# Patient Record
Sex: Male | Born: 1945 | Race: White | Hispanic: No | Marital: Married | Smoking: Former smoker
Health system: Southern US, Community
[De-identification: ages and names within clinical notes are randomized; demographics above are authoritative.]

## PROBLEM LIST (undated history)

## (undated) DIAGNOSIS — J45909 Unspecified asthma, uncomplicated: Secondary | ICD-10-CM

## (undated) DIAGNOSIS — K5792 Diverticulitis of intestine, part unspecified, without perforation or abscess without bleeding: Secondary | ICD-10-CM

## (undated) DIAGNOSIS — R011 Cardiac murmur, unspecified: Secondary | ICD-10-CM

## (undated) DIAGNOSIS — N189 Chronic kidney disease, unspecified: Secondary | ICD-10-CM

## (undated) DIAGNOSIS — K222 Esophageal obstruction: Secondary | ICD-10-CM

## (undated) DIAGNOSIS — I10 Essential (primary) hypertension: Secondary | ICD-10-CM

## (undated) DIAGNOSIS — E785 Hyperlipidemia, unspecified: Secondary | ICD-10-CM

## (undated) DIAGNOSIS — K219 Gastro-esophageal reflux disease without esophagitis: Secondary | ICD-10-CM

## (undated) DIAGNOSIS — I2510 Atherosclerotic heart disease of native coronary artery without angina pectoris: Secondary | ICD-10-CM

## (undated) DIAGNOSIS — R1310 Dysphagia, unspecified: Secondary | ICD-10-CM

## (undated) DIAGNOSIS — Z87442 Personal history of urinary calculi: Secondary | ICD-10-CM

## (undated) DIAGNOSIS — M1990 Unspecified osteoarthritis, unspecified site: Secondary | ICD-10-CM

## (undated) DIAGNOSIS — N200 Calculus of kidney: Secondary | ICD-10-CM

## (undated) DIAGNOSIS — J302 Other seasonal allergic rhinitis: Secondary | ICD-10-CM

## (undated) DIAGNOSIS — Z5189 Encounter for other specified aftercare: Secondary | ICD-10-CM

## (undated) DIAGNOSIS — K5790 Diverticulosis of intestine, part unspecified, without perforation or abscess without bleeding: Secondary | ICD-10-CM

## (undated) DIAGNOSIS — R7303 Prediabetes: Secondary | ICD-10-CM

## (undated) DIAGNOSIS — L309 Dermatitis, unspecified: Secondary | ICD-10-CM

## (undated) DIAGNOSIS — IMO0002 Reserved for concepts with insufficient information to code with codable children: Secondary | ICD-10-CM

## (undated) HISTORY — DX: Reserved for concepts with insufficient information to code with codable children: IMO0002

## (undated) HISTORY — PX: HIATAL HERNIA REPAIR: SHX195

## (undated) HISTORY — DX: Dysphagia, unspecified: R13.10

## (undated) HISTORY — DX: Diverticulosis of intestine, part unspecified, without perforation or abscess without bleeding: K57.90

## (undated) HISTORY — DX: Dermatitis, unspecified: L30.9

## (undated) HISTORY — DX: Other seasonal allergic rhinitis: J30.2

## (undated) HISTORY — DX: Chronic kidney disease, unspecified: N18.9

## (undated) HISTORY — DX: Encounter for other specified aftercare: Z51.89

## (undated) HISTORY — DX: Essential (primary) hypertension: I10

## (undated) HISTORY — DX: Esophageal obstruction: K22.2

## (undated) HISTORY — DX: Hyperlipidemia, unspecified: E78.5

## (undated) HISTORY — DX: Diverticulitis of intestine, part unspecified, without perforation or abscess without bleeding: K57.92

## (undated) HISTORY — DX: Unspecified asthma, uncomplicated: J45.909

## (undated) HISTORY — DX: Calculus of kidney: N20.0

## (undated) HISTORY — PX: WISDOM TOOTH EXTRACTION: SHX21

## (undated) HISTORY — PX: LAPAROSCOPIC NISSEN FUNDOPLICATION: SHX1932

## (undated) HISTORY — DX: Gastro-esophageal reflux disease without esophagitis: K21.9

---

## 2000-01-18 ENCOUNTER — Ambulatory Visit (HOSPITAL_COMMUNITY): Admission: RE | Admit: 2000-01-18 | Discharge: 2000-01-18 | Payer: Self-pay | Admitting: Internal Medicine

## 2002-01-28 ENCOUNTER — Emergency Department (HOSPITAL_COMMUNITY): Admission: EM | Admit: 2002-01-28 | Discharge: 2002-01-28 | Payer: Self-pay | Admitting: Emergency Medicine

## 2006-10-03 ENCOUNTER — Encounter: Admission: RE | Admit: 2006-10-03 | Discharge: 2006-10-03 | Payer: Self-pay | Admitting: Family Medicine

## 2010-11-25 DIAGNOSIS — IMO0001 Reserved for inherently not codable concepts without codable children: Secondary | ICD-10-CM

## 2010-11-25 DIAGNOSIS — Z5189 Encounter for other specified aftercare: Secondary | ICD-10-CM

## 2010-11-25 HISTORY — PX: COLONOSCOPY: SHX174

## 2010-11-25 HISTORY — DX: Reserved for inherently not codable concepts without codable children: IMO0001

## 2010-11-25 HISTORY — DX: Encounter for other specified aftercare: Z51.89

## 2010-11-28 ENCOUNTER — Encounter (INDEPENDENT_AMBULATORY_CARE_PROVIDER_SITE_OTHER): Payer: Self-pay | Admitting: *Deleted

## 2010-12-27 NOTE — Letter (Signed)
Summary: Pre Visit Letter Revised  [facility]  [address] [place], [place] [address]   Phone: [phone]  Fax: [phone]        11/28/2010 MRN: [id number] [name] [name] [address] [place], [place]  [address]             Procedure Date:  January 22, 2011   Dir Col-Dr Juanda Chance   Welcome to the Gastroenterology Division at [facility].    You are scheduled to see a nurse for your pre-procedure visit on January 08, 2011 at 8:00am on the 3rd floor at [facility], [address]  We ask that you try to arrive at our office 15 minutes prior to your appointment time to allow for check-in.  Please take a minute to review the attached form.  If you answer "Yes" to one or more of the questions on the first page, we ask that you call the person listed at your earliest opportunity.  If you answer "No" to all of the questions, please complete the rest of the form and bring it to your appointment.    Your nurse visit will consist of discussing your medical and surgical history, your immediate family medical history, and your medications.   If you are unable to list all of your medications on the form, please bring the medication bottles to your appointment and we will list them.  We will need to be aware of both prescribed and over the counter drugs.  We will need to know exact dosage information as well.    Please be prepared to read and sign documents such as consent forms, a financial agreement, and acknowledgement forms.  If necessary, and with your consent, a friend or relative is welcome to sit-in on the nurse visit with you.  Please bring your insurance card so that we may make a copy of it.  If your insurance requires a referral to see a specialist, please bring your referral form from your primary care physician.  No co-pay is required for this nurse visit.     If you cannot keep your appointment, please call [phone] to cancel or reschedule prior to  your appointment date.  This allows [place] the opportunity to schedule an appointment for another patient in need of care.    Thank you for choosing [facility] for your medical needs.  We appreciate the opportunity to care for you.  Please visit [place] at our website  to learn more about our practice.  Sincerely, The Gastroenterology Division

## 2011-01-04 ENCOUNTER — Encounter (INDEPENDENT_AMBULATORY_CARE_PROVIDER_SITE_OTHER): Payer: Self-pay

## 2011-01-08 ENCOUNTER — Encounter: Payer: Self-pay | Admitting: Internal Medicine

## 2011-01-16 NOTE — Letter (Signed)
Summary: [facility] - Closed Instructions  [facility]  [address] [place], [place] [address]   Phone: [phone]  Fax: [phone]       [name] [name]    Jul 16, 1946    MRN: [id number]       Procedure Day /Date:  Tuesday 01/22/2011     Arrival Time: 7:30 am     Procedure Time: 8:30 am     Location of Procedure:                    _x _  [facility] (4th Floor)    PREPARATION FOR COLONOSCOPY WITH MIRALAX  Starting 5 days prior to your procedure Thursday 2/23 do not eat nuts, seeds, popcorn, corn, beans, peas,  salads, or any raw vegetables.  Do not take any fiber supplements (e.g. Metamucil, Citrucel, and Benefiber). ____________________________________________________________________________________________________   THE DAY BEFORE YOUR PROCEDURE         DATE: Monday 2/27  1   Drink clear liquids the entire day-NO SOLID FOOD  2   Do not drink anything colored red or purple.  Avoid juices with pulp.  No orange juice.  3   Drink at least 64 oz. (8 glasses) of fluid/clear liquids during the day to prevent dehydration and help the prep work efficiently.  CLEAR LIQUIDS INCLUDE: Water Jello Ice Popsicles Tea (sugar ok, no milk/cream) Powdered fruit flavored drinks Coffee (sugar ok, no milk/cream) Gatorade Juice: apple, white grape, white cranberry  Lemonade Clear bullion, consomm, broth Carbonated beverages (any kind) Strained chicken noodle soup Hard Candy  4   Mix the entire bottle of Miralax with 64 oz. of Gatorade/Powerade in the morning and put in the refrigerator to chill.  5   At 3:00 pm take 2 Dulcolax/Bisacodyl tablets.  6   At 4:30 pm take one Reglan/Metoclopramide tablet.  7  Starting at 5:00 pm drink one 8 oz glass of the Miralax mixture every 15-20 minutes until you have finished drinking the entire 64 oz.  You should finish drinking prep around 7:30 or 8:00 pm.  8   If you are nauseated, you may take the 2nd Reglan/Metoclopramide tablet  at 6:30 pm.        9    At 8:00 pm take 2 more DULCOLAX/Bisacodyl tablets.     THE DAY OF YOUR PROCEDURE      DATE:  Tuesday 2/28  You may drink clear liquids until 6:30 am   (2 HOURS BEFORE PROCEDURE).   MEDICATION INSTRUCTIONS  Unless otherwise instructed, you should take regular prescription medications with a small sip of water as early as possible the morning of your procedure.         OTHER INSTRUCTIONS  You will need a responsible adult at least 63 years of age to accompany you and drive you home.   This person must remain in the waiting room during your procedure.  Wear loose fitting clothing that is easily removed.  Leave jewelry and other valuables at home.  However, you may wish to bring a book to read or an iPod/MP3 player to listen to music as you wait for your procedure to start.  Remove all body piercing jewelry and leave at home.  Total time from sign-in until discharge is approximately 2-3 hours.  You should go home directly after your procedure and rest.  You can resume normal activities the day after your procedure.  The day of your procedure you should not:   Drive  Make legal decisions   Operate machinery   Drink alcohol   Return to work  You will receive specific instructions about eating, activities and medications before you leave.   The above instructions have been reviewed and explained to me by   Ulis Rias RN  January 08, 2011 8:19 AM     I fully understand and can verbalize these instructions _____________________________ Date _______

## 2011-01-16 NOTE — Miscellaneous (Signed)
Summary: Lec previsit  Clinical Lists Changes  Medications: Added new medication of MIRALAX   POWD (POLYETHYLENE GLYCOL 3350) As per prep  instructions. - Signed Added new medication of DULCOLAX 5 MG  TBEC (BISACODYL) Day before procedure take 2 at 3pm and 2 at 8pm. - Signed Added new medication of REGLAN 10 MG  TABS (METOCLOPRAMIDE HCL) As per prep instructions. - Signed Rx of MIRALAX   POWD (POLYETHYLENE GLYCOL 3350) As per prep  instructions.;  #255gm x 0;  Signed;  Entered by: Ulis Rias RN;  Authorized by: Hart Carwin MD;  Method used: Electronically to [id number] [address] [place], [place], [place]  [address], Ph: [phone], Fax: [phone] Rx of DULCOLAX 5 MG  TBEC (BISACODYL) Day before procedure take 2 at 3pm and 2 at 8pm.;  #4 x 0;  Signed;  Entered by: Ulis Rias RN;  Authorized by: Hart Carwin MD;  Method used: Electronically to [id number] [address] [place], [place], [place]  [address], Ph: [phone], Fax: [phone] Rx of REGLAN 10 MG  TABS (METOCLOPRAMIDE HCL) As per prep instructions.;  #2 x 0;  Signed;  Entered by: Ulis Rias RN;  Authorized by: Hart Carwin MD;  Method used: [facility] [phone]*, [address], [place], [place], [place]  [address], Ph: [phone], Fax: [phone] Allergies: Added new allergy or adverse reaction of * SULFUR Observations: Added new observation of NKA: F (01/08/2011 8:04)    Prescriptions: REGLAN 10 MG  TABS (METOCLOPRAMIDE HCL) As per prep instructions.  #2 x 0   Entered by:   Ulis Rias RN   Authorized by:   Hart Carwin MD   Signed by:   Ulis Rias RN on 01/08/2011   Method used:   [facility]ronically to        [id number] ([facility])       [address]       [place], [place]  [address]       Ph: [phone]       Fax: [phone]   RxID:   [id number] DULCOLAX 5 MG  TBEC (BISACODYL) Day  before procedure take 2 at 3pm and 2 at 8pm.  #4 x 0   Entered by:   Ulis Rias RN   Authorized by:   Hart Carwin MD   Signed by:   Ulis Rias RN on 01/08/2011   Method used:   [facility]ron[facility]        [facility] [address] [phone][facility] (retail)       [address]       [place], [place]  [address]       Ph: [phone]       Fax: [phone]   RxID:   [id number] MIRALAX   POWD (POLYETHYLENE GLYCOL 3350) As per prep  instructions.  #255gm x 0   Entered by:   Ulis Rias RN   Authorized by:   Hart Carwin MD   Signed by:   Ulis Rias RN on 01/08/2011   Method used:   [facility]        [facility] [id number]       [address] [facility]  [place], [place]  [address]       Ph: [phone]       Fax: [phone]   RxID:   [id number]

## 2011-01-22 ENCOUNTER — Other Ambulatory Visit (AMBULATORY_SURGERY_CENTER): Payer: Managed Care, Other (non HMO) | Admitting: Internal Medicine

## 2011-01-22 ENCOUNTER — Other Ambulatory Visit: Payer: Self-pay | Admitting: Internal Medicine

## 2011-01-22 ENCOUNTER — Encounter: Payer: Self-pay | Admitting: Internal Medicine

## 2011-01-22 DIAGNOSIS — K5730 Diverticulosis of large intestine without perforation or abscess without bleeding: Secondary | ICD-10-CM

## 2011-01-22 DIAGNOSIS — Z1211 Encounter for screening for malignant neoplasm of colon: Secondary | ICD-10-CM

## 2011-01-31 NOTE — Procedures (Signed)
Summary: Colonoscopy  Patient: Seanpaul Preece Note: All result statuses are Final unless otherwise noted.  Tests: (1) Colonoscopy (COL)   COL Colonoscopy           DONE     [facility]     [address]     [place], [place]  [address]           COLONOSCOPY PROCEDURE REPORT           PATIENT:  Inocencio, Roy  MR#:  [id number]     BIRTHDATE:  30-Oct-1946, 64 yrs. old  GENDER:  male     ENDOSCOPIST:  Hedwig Morton. Juanda Chance, MD     REF. BY:  Thayer Headings, M.D.     PROCEDURE DATE:  01/22/2011     PROCEDURE:  Colonoscopy 19[address]     ASA CLASS:  Class I     INDICATIONS:  colorectal cancer screening, average risk     MEDICATIONS:   Versed 6 mg, Fentanyl 50 mcg           DESCRIPTION OF PROCEDURE:   After the risks benefits and     alternatives of the procedure were thoroughly explained, informed     consent was obtained.  Digital rectal exam was performed and     revealed no rectal masses.   The LB [id number] endoscope     was introduced through the anus and advanced to the cecum, which     was identified by both the appendix and ileocecal valve, without     limitations.  The quality of the prep was good, using MiraLax.     The instrument was then slowly withdrawn as the colon was fully     examined.     <<PROCEDUREIMAGES>>           FINDINGS:  Mild diverticulosis was found in the sigmoid colon (see     image1 and image6).  This was otherwise a normal examination of     the colon (see image7, image5, image4, image3, and image2).     Retroflexed views in the rectum revealed no abnormalities.    The     scope was then withdrawn from the patient and the procedure     completed.           COMPLICATIONS:  None     ENDOSCOPIC IMPRESSION:     1) Mild diverticulosis in the sigmoid colon     2) Otherwise normal examination     RECOMMENDATIONS:     1) high fiber diet     REPEAT EXAM:  In 10 year(s) for.           ______________________________     Hedwig Morton. Juanda Chance, MD            CC:           n.     eSIGNED:   Hedwig Morton. Octavius Shin at 01/22/2011 09:04 AM           Jacolyn Reedy, [phone]  Note: An exclamation mark (!) indicates a result that was not dispersed into the flowsheet. Document Creation Date: 01/22/2011 9:04 AM _______________________________________________________________________  (1) Order result status: Final Collection or observation date-time: 01/22/2011 08:58 Requested date-time:  Receipt date-time:  Reported date-time:  Referring Physician:   Ordering Physician: Lina Sar [phone]) Specimen Source:  Source: Launa Grill Order Number: [id number] Lab site:   Appended Document: Colonoscopy    Clinical Lists Changes  Observations: Added new observation of COLONNXTDUE: 12/2020 (  01/22/2011 12:50)     

## 2011-08-27 ENCOUNTER — Inpatient Hospital Stay (HOSPITAL_COMMUNITY)
Admission: EM | Admit: 2011-08-27 | Discharge: 2011-08-30 | DRG: 381 | Disposition: A | Discharge status: Home / Self Care | Payer: Managed Care, Other (non HMO) | Attending: Internal Medicine | Admitting: Internal Medicine

## 2011-08-27 DIAGNOSIS — K920 Hematemesis: Secondary | ICD-10-CM

## 2011-08-27 DIAGNOSIS — K5730 Diverticulosis of large intestine without perforation or abscess without bleeding: Secondary | ICD-10-CM | POA: Diagnosis present

## 2011-08-27 DIAGNOSIS — Z7982 Long term (current) use of aspirin: Secondary | ICD-10-CM

## 2011-08-27 DIAGNOSIS — D62 Acute posthemorrhagic anemia: Secondary | ICD-10-CM

## 2011-08-27 DIAGNOSIS — K253 Acute gastric ulcer without hemorrhage or perforation: Secondary | ICD-10-CM

## 2011-08-27 DIAGNOSIS — K2211 Ulcer of esophagus with bleeding: Principal | ICD-10-CM | POA: Diagnosis present

## 2011-08-27 DIAGNOSIS — K449 Diaphragmatic hernia without obstruction or gangrene: Secondary | ICD-10-CM | POA: Diagnosis present

## 2011-08-27 DIAGNOSIS — I10 Essential (primary) hypertension: Secondary | ICD-10-CM | POA: Diagnosis present

## 2011-08-27 DIAGNOSIS — I959 Hypotension, unspecified: Secondary | ICD-10-CM

## 2011-08-27 LAB — CBC
Hemoglobin: 11.4 g/dL — ABNORMAL LOW (ref 13.0–17.0)
MCHC: 34 g/dL (ref 30.0–36.0)
RBC: 3.67 MIL/uL — ABNORMAL LOW (ref 4.22–5.81)
WBC: 19.1 10*3/uL — ABNORMAL HIGH (ref 4.0–10.5)

## 2011-08-27 LAB — DIFFERENTIAL
Basophils Absolute: 0 10*3/uL (ref 0.0–0.1)
Basophils Relative: 0 % (ref 0–1)
Eosinophils Absolute: 0 10*3/uL (ref 0.0–0.7)
Eosinophils Relative: 0 % (ref 0–5)
Lymphs Abs: 3.5 10*3/uL (ref 0.7–4.0)
Monocytes Relative: 7 % (ref 3–12)

## 2011-08-27 LAB — COMPREHENSIVE METABOLIC PANEL
ALT: 14 U/L (ref 0–53)
AST: 17 U/L (ref 0–37)
BUN: 27 mg/dL — ABNORMAL HIGH (ref 6–23)
Calcium: 8.8 mg/dL (ref 8.4–10.5)
Creatinine, Ser: 1.04 mg/dL (ref 0.50–1.35)
GFR calc Af Amer: 86 mL/min — ABNORMAL LOW (ref >90)
Sodium: 139 mEq/L (ref 135–145)
Total Bilirubin: 0.5 mg/dL (ref 0.3–1.2)

## 2011-08-27 LAB — URINALYSIS, ROUTINE W REFLEX MICROSCOPIC
Glucose, UA: NEGATIVE mg/dL
Leukocytes, UA: NEGATIVE
Specific Gravity, Urine: 1.015 (ref 1.005–1.030)
Urobilinogen, UA: 0.2 mg/dL (ref 0.0–1.0)
pH: 6 (ref 5.0–8.0)

## 2011-08-27 LAB — HEMOGLOBIN AND HEMATOCRIT, BLOOD: HCT: 30.4 % — ABNORMAL LOW (ref 39.0–52.0)

## 2011-08-27 LAB — POCT I-STAT, CHEM 8
BUN: 33 mg/dL — ABNORMAL HIGH (ref 6–23)
Chloride: 107 mEq/L (ref 96–112)
Creatinine, Ser: 1 mg/dL (ref 0.50–1.35)
Glucose, Bld: 178 mg/dL — ABNORMAL HIGH (ref 70–99)
HCT: 34 % — ABNORMAL LOW (ref 39.0–52.0)
Potassium: 4.5 mEq/L (ref 3.5–5.1)
TCO2: 21 mmol/L (ref 0–100)

## 2011-08-27 LAB — APTT: aPTT: 23 seconds — ABNORMAL LOW (ref 24–37)

## 2011-08-27 LAB — PREPARE RBC (CROSSMATCH)

## 2011-08-27 LAB — CK TOTAL AND CKMB (NOT AT ARMC): CK, MB: 2 ng/mL (ref 0.3–4.0)

## 2011-08-27 LAB — MRSA PCR SCREENING: MRSA by PCR: NEGATIVE

## 2011-08-27 LAB — PROTIME-INR: INR: 1.21 (ref 0.00–1.49)

## 2011-08-27 LAB — URINE MICROSCOPIC-ADD ON

## 2011-08-27 LAB — TROPONIN I: Troponin I: <0.3 ng/mL (ref <0.30)

## 2011-08-27 LAB — ABO/RH: ABO/RH(D): O POS

## 2011-08-28 DIAGNOSIS — D62 Acute posthemorrhagic anemia: Secondary | ICD-10-CM

## 2011-08-28 DIAGNOSIS — K920 Hematemesis: Secondary | ICD-10-CM

## 2011-08-28 DIAGNOSIS — I959 Hypotension, unspecified: Secondary | ICD-10-CM

## 2011-08-28 DIAGNOSIS — K253 Acute gastric ulcer without hemorrhage or perforation: Secondary | ICD-10-CM

## 2011-08-28 LAB — HEMOGLOBIN AND HEMATOCRIT, BLOOD: Hemoglobin: 10 g/dL — ABNORMAL LOW (ref 13.0–17.0)

## 2011-08-28 LAB — CBC
HCT: 26.9 % — ABNORMAL LOW (ref 39.0–52.0)
Hemoglobin: 9.2 g/dL — ABNORMAL LOW (ref 13.0–17.0)
MCH: 30.4 pg (ref 26.0–34.0)
Platelets: 134 10*3/uL — ABNORMAL LOW (ref 150–400)
WBC: 6.9 10*3/uL (ref 4.0–10.5)

## 2011-08-29 ENCOUNTER — Inpatient Hospital Stay (HOSPITAL_COMMUNITY): Payer: Managed Care, Other (non HMO)

## 2011-08-29 DIAGNOSIS — R933 Abnormal findings on diagnostic imaging of other parts of digestive tract: Secondary | ICD-10-CM

## 2011-08-29 LAB — HEMOGLOBIN AND HEMATOCRIT, BLOOD
HCT: 29.7 % — ABNORMAL LOW (ref 39.0–52.0)
Hemoglobin: 10.1 g/dL — ABNORMAL LOW (ref 13.0–17.0)

## 2011-08-30 ENCOUNTER — Other Ambulatory Visit: Payer: Self-pay | Admitting: *Deleted

## 2011-08-30 DIAGNOSIS — D649 Anemia, unspecified: Secondary | ICD-10-CM

## 2011-08-30 LAB — CBC
MCHC: 34.4 g/dL (ref 30.0–36.0)
Platelets: 136 10*3/uL — ABNORMAL LOW (ref 150–400)
RDW: 13.9 % (ref 11.5–15.5)
WBC: 5.6 10*3/uL (ref 4.0–10.5)

## 2011-08-31 LAB — TYPE AND SCREEN
ABO/RH(D): O POS
Antibody Screen: NEGATIVE
Unit division: 0
Unit division: 0
Unit division: 0
Unit division: 0
Unit division: 0

## 2011-09-04 ENCOUNTER — Encounter: Payer: Self-pay | Admitting: *Deleted

## 2011-09-04 ENCOUNTER — Telehealth: Payer: Self-pay | Admitting: Internal Medicine

## 2011-09-04 ENCOUNTER — Other Ambulatory Visit (INDEPENDENT_AMBULATORY_CARE_PROVIDER_SITE_OTHER): Payer: Managed Care, Other (non HMO)

## 2011-09-04 DIAGNOSIS — D649 Anemia, unspecified: Secondary | ICD-10-CM

## 2011-09-04 LAB — CBC WITH DIFFERENTIAL/PLATELET
Basophils Relative: 0.2 % (ref 0.0–3.0)
Eosinophils Absolute: 0 10*3/uL (ref 0.0–0.7)
Eosinophils Relative: 0.2 % (ref 0.0–5.0)
Hemoglobin: 10.8 g/dL — ABNORMAL LOW (ref 13.0–17.0)
Lymphocytes Relative: 4.9 % — ABNORMAL LOW (ref 12.0–46.0)
Monocytes Relative: 3.5 % (ref 3.0–12.0)
Neutro Abs: 8.3 10*3/uL — ABNORMAL HIGH (ref 1.4–7.7)
Neutrophils Relative %: 91.2 % — ABNORMAL HIGH (ref 43.0–77.0)
RBC: 3.47 Mil/uL — ABNORMAL LOW (ref 4.22–5.81)
WBC: 9.1 10*3/uL (ref 4.5–10.5)

## 2011-09-04 NOTE — Telephone Encounter (Signed)
Spoke with Mike Gip, PA and patient's Hgb is better at 10.8. Per Amy, patient may return to work on Monday. Letter made for patient. Patient states he is seeing Dr. Daphine Deutscher on Friday at 11:30 for OV.

## 2011-09-04 NOTE — Telephone Encounter (Signed)
Left a message for patient to call me. 

## 2011-09-06 ENCOUNTER — Ambulatory Visit (INDEPENDENT_AMBULATORY_CARE_PROVIDER_SITE_OTHER): Payer: Managed Care, Other (non HMO) | Admitting: Surgery

## 2011-09-06 ENCOUNTER — Encounter (INDEPENDENT_AMBULATORY_CARE_PROVIDER_SITE_OTHER): Payer: Self-pay | Admitting: Surgery

## 2011-09-06 VITALS — BP 138/84 | HR 82 | Ht 71.0 in | Wt 191.5 lb

## 2011-09-06 DIAGNOSIS — K254 Chronic or unspecified gastric ulcer with hemorrhage: Secondary | ICD-10-CM

## 2011-09-06 NOTE — Progress Notes (Signed)
Subjective:     Patient ID: [name] [name], male   DOB: 13-Aug-1946, 65 y.o.   MRN: [id number]  HPI 65 year old white male had a Nissen fundoplication by Mosetta Anis in the early 90s.  [name] [name] gets frequent kidney stones and wretches a lot.  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  His UGI was reviewed and shows a prominent herniated paraesophageal component.   I have discussed laparoscopic and open redo Nissen fundoplication and repair of his hiatus hernia.  He is aware of the risk of perforation, failure, etc.  Allergies  Allergen Reactions  . Percocet (Oxycodone-Acetaminophen)   . Sulfur     REACTION: not sure  . Sulphadimidine Sodium (Sulfamethazine Sodium)    Current Outpatient Prescriptions  Medication Sig Dispense Refill  . Pantoprazole Sodium (PROTONIX PO) Take by mouth.         Past Medical History  Diagnosis Date  . GERD (gastroesophageal reflux disease)   . Trouble swallowing   . HBP (high blood pressure)   . Hyperlipemia   . Constipation   No past surgical history on file.       Review of Systems  Constitutional: Positive for fatigue.  HENT: Negative.   Eyes: Negative.   Respiratory: Negative.   Cardiovascular: Negative.   Gastrointestinal: Positive for constipation.  Genitourinary: Negative.   Musculoskeletal: Negative.   Skin: Negative.   Neurological: Negative.   Hematological: Negative.   Psychiatric/Behavioral: Negative.        Objective:   Physical Exam  Constitutional: He is oriented to person, place, and time. He appears well-developed and well-nourished.  HENT:  Head: Normocephalic and atraumatic.  Eyes: Conjunctivae and EOM are normal. Pupils are equal, round, and reactive to light.  Neck: Normal range of motion. Neck supple.  Cardiovascular: Normal rate, regular rhythm and normal heart sounds.     Pulmonary/Chest: Effort normal and breath sounds normal.  Abdominal: Soft. Bowel sounds are normal.  Musculoskeletal: Normal range of motion.  Neurological: He is alert and oriented to person, place, and time.  Skin: Skin is warm and dry.  Psychiatric: He has a normal mood and affect. His behavior is normal. Judgment and thought content normal.   Filed Vitals:   09/06/11 1156  BP: 138/84  Pulse: 82      Assessment:     Failed open Nissen fundoplication.  Plan lap/open redo Nissen and repair of hiatus hernia    Plan:     Lap/open redo Nissen fundoplicaiton

## 2011-09-06 NOTE — Discharge Summary (Signed)
NAMEMarland [name]  [name], [name] NO.:  [id number]  MEDICAL RECORD NO.:  [id number]  LOCATION:  1332                         FACILITY:  [facility]  PHYSICIAN:  Wilhemina Bonito. [name] Goodell, MD      DATE OF BIRTH:  1946/09/04  DATE OF ADMISSION:  08/27/2011 DATE OF DISCHARGE:  08/30/2011                              DISCHARGE SUMMARY   ADMITTING DIAGNOSES: 36. A 65 year old white male with acute upper gastrointestinal bleed,     major; rule out Mallory-Weiss tear, peptic ulcer disease. 2. Anemia secondary to acute blood loss. 3. Leukocytosis, etiology not clear, question demargination. 4. History of hypertension. 5. Status post remote Nissen fundoplication in 1995. 6. Diverticulosis.  DISCHARGE DIAGNOSES: 44. A 65 year old white male, stable status post acute major upper     gastrointestinal bleed secondary to an esophageal ulcer located in     what appears to be a paraesophageal hernia sac, not treated. 2. Anemia, posthemorrhagic, stable. 3. New diagnosis of large paraesophageal hernia. 4. History of hypertension.  CONSULTATIONS:  None.  PROCEDURES:  Upper endoscopy with Dr. Lina Sar and upper GI barium swallow.  BRIEF HISTORY:  [name] [name] is a 65 year old white male, generally in good health, known to Dr. Lina Sar from prior colonoscopy; who presented to the emergency room on the afternoon of admission with acute onset of nausea and vomiting earlier in the afternoon with multiple episodes of large-volume hematemesis of dark red blood, clots, and black material. The patient says that he began retching at home and had blood with his initial episode of vomiting.  He was witnessed to have multiple episodes of hard retching and vomited a large amount of blood while in the emergency room.  He was not hypotensive, but was tachycardiac and felt to be shocky.  He was fluid resuscitated and given 2 units of packed RBCs stat in the emergency room.  Initial hemoglobin was 11.6.  He had stated  that he had been taking baby aspirin, later learned that he had been taking some regular aspirin at bedtime and also ibuprofen for neck pain.  He was seen and evaluated in the emergency room and then transferred to the intensive care unit for medical management and upper endoscopy.  LABORATORY STUDIES:  On October 2, WBC was 19.1, hemoglobin 11.6, hematocrit of 34, MCV of 91, platelets 274.  Pro-time was 15.6, INR 1.21.  Electrolytes within normal limits.  BUN 33, creatinine 1.  Liver function studies normal.  UA was negative.  Serial H and H were obtained and on the morning of October 3, hemoglobin was 9.2 and hematocrit of 26.9, that was after 2-unit transfusion.  WBC down to 6.9, platelets 134.  His hemoglobin remained stable thereafter.  On October 4, it was 9.3; later in the day on October 4, 10.1; and on the morning of October 5, WBC was 5.6, hemoglobin 9.4, hematocrit of 27.3, platelets 136.  X-RAY STUDIES:  Barium swallow and upper GI on October 4th showed a large paraesophageal hernia.  Surgical clips were noted below the hemidiaphragm from prior repair, also had nonspecific esophageal motility changes.  HOSPITAL COURSE:  The patient was admitted to the service of Dr. Jonny Ruiz  [name] [name] to the intensive care unit.  He was volume resuscitated, again received 2 units of packed RBCs in the emergency room.  He underwent urgent upper endoscopy with Dr. Lina Sar on the evening of admission with finding of a 5 mm shallow ulcer within his hernia.  There was a blood clot which was irrigated.  No active bleeding thereafter and the ulcer was not treated.  He was felt to have a paraesophageal hernia at the time of endoscopy.  Remainder of exam was unremarkable.  The patient had a stable hospitalization, did not manifest any further active bleeding.  We gradually advanced his diet.  He did have some coughing and stated that he had been coughing some at night recently due to sinus drainage and  was given Tussionex as needed for his cough.  He was kept on IV PPI b.i.d. and also Carafate during his hospitalization. Barium swallow, upper GI was done on 4th, with results as outlined above.  His management was discussed between Dr. Marina [name] and Dr. Juanda Chance, and both felt that he should have surgical referral for repair of the paraesophageal hernia.  He has made an appointment to see Dr. Luretha Murphy on Friday, October 12th, at 11:30 a.m.  He is asked to refrain from any vigorous activity over the next week and to discontinue all aspirin and NSAID use.  He is allowed to discharge on October 5.  He will remain on a soft diet over the next several days and Protonix 40 mg p.o. twice daily.  We will check followup CBC next week as well.  Other meds on discharge are; 1. Tussionex 5 cc q.12 hours as needed. 2. Again, Protonix 40 b.i.d. 3. Zofran 4 mg q.6 hours p.r.n. 4. Losartan 50 mg p.o. once daily. 5. He will use Tylenol as needed for pain.     Amy Esterwood, PA-C   ______________________________ Wilhemina Bonito. [name] Goodell, MD    AE/MEDQ  D:  08/30/2011  T:  08/30/2011  Job:  [id number]  cc:   [place] Daphine Deutscher, MD [address], [address] [place] [address]  Electronically Signed by [name] [name] on 09/03/2011 01:53:53 PM Electronically Signed by [name] Flemings MD on 09/06/2011 11:53:18 AM

## 2011-09-20 ENCOUNTER — Encounter (HOSPITAL_COMMUNITY): Payer: Managed Care, Other (non HMO)

## 2011-09-20 ENCOUNTER — Other Ambulatory Visit (INDEPENDENT_AMBULATORY_CARE_PROVIDER_SITE_OTHER): Payer: Self-pay | Admitting: Surgery

## 2011-09-20 ENCOUNTER — Telehealth (INDEPENDENT_AMBULATORY_CARE_PROVIDER_SITE_OTHER): Payer: Self-pay

## 2011-09-20 ENCOUNTER — Ambulatory Visit (HOSPITAL_COMMUNITY)
Admission: RE | Admit: 2011-09-20 | Discharge: 2011-09-20 | Disposition: A | Discharge status: Home / Self Care | Payer: Managed Care, Other (non HMO) | Source: Ambulatory Visit | Attending: Surgery | Admitting: Surgery

## 2011-09-20 DIAGNOSIS — Z01818 Encounter for other preprocedural examination: Secondary | ICD-10-CM

## 2011-09-20 DIAGNOSIS — Z01812 Encounter for preprocedural laboratory examination: Secondary | ICD-10-CM | POA: Insufficient documentation

## 2011-09-20 LAB — CBC
HCT: 33.3 % — ABNORMAL LOW (ref 39.0–52.0)
Hemoglobin: 10.7 g/dL — ABNORMAL LOW (ref 13.0–17.0)
MCH: 29.2 pg (ref 26.0–34.0)
MCV: 91 fL (ref 78.0–100.0)
RBC: 3.66 MIL/uL — ABNORMAL LOW (ref 4.22–5.81)

## 2011-09-20 LAB — BASIC METABOLIC PANEL
BUN: 12 mg/dL (ref 6–23)
CO2: 28 mEq/L (ref 19–32)
Calcium: 9.2 mg/dL (ref 8.4–10.5)
Glucose, Bld: 100 mg/dL — ABNORMAL HIGH (ref 70–99)
Sodium: 140 mEq/L (ref 135–145)

## 2011-09-20 LAB — SURGICAL PCR SCREEN: Staphylococcus aureus: POSITIVE — AB

## 2011-09-20 NOTE — Telephone Encounter (Signed)
Pt called RU:EAVWU prep pre op. Pt states at pre op office visit he received colon prep instruction sheet and was advised our office would call in the golytely and antibiotics to his pharmacy. Pt called pharmacy but no rx called in. Per surgery orders I called in golytely , erthromycin and neomycin per colon prep to [facility] [address]. Pt notified rx called in.

## 2011-09-22 NOTE — H&P (Signed)
[name], [name] NO.:  [name]  MEDICAL RECORD NO.:  [id number]  LOCATION:  1234                         FACILITY:  [name]  PHYSICIAN:  Jonny Ruiz, MD    DATE OF BIRTH:  [birth year]  DATE OF ADMISSION:  08/27/2011 DATE OF DISCHARGE:                             HISTORY & PHYSICAL   PRIMARY CARE PHYSICIAN:  Lorelle Formosa, M.D.  CHIEF COMPLAINT:  Vomiting blood.  HISTORY OF PRESENT ILLNESS:  The patient is a 65 year old man with a history of arthralgias, for which he was taking aspirin 1-2 tablets per day along with ibuprofen OTC 1-2 tablets each night who was feeling sick for several days and today felt nauseated and began vomiting blood. While waiting in the waiting room in the emergency department at [facility], he began vomiting more blood and it became severe.  The patient became pale and diaphoretic and was taken immediately to resuscitation room B.  Two large bore IV sites were taken and he received one bolus of normal saline along with 3 units of O negative blood.  The patient continued vomiting large amounts of clotted blood.  He was given Protonix and Zofran IV.  Subsequently, code team was called including pulmonary critical care and GI.  The patient was taking to the endoscopy unit and he received an upper endoscopy performed by Dr. __________.  He was found with a nonbleeding 5 mm prior esophageal ulcer.  He was given IV Phenergan and IV Protonix and the patient became stable. Subsequently, we were called to take over his hospitalization care.  PAST MEDICAL HISTORY: 1. Hypertension. 2. Hiatal hernia, for which he underwent fundoplication in 1983.  MEDICATIONS: 1. Losartan 50 mg a day. 2. Aspirin 1-2 tablets a day. 3. Ibuprofen over-the-counter 1-2 tablets per day.  FAMILY HISTORY:  His father died of a myocardial infarction at the age of 56 in the doctor's office.  Mother had diverticulosis and diverticulitis, severe,  requiring colectomy with colostomy.  SOCIAL HISTORY:  The patient has been married twice.  He had 2 children with his first wife and his current wife had 2 children prior to his marriage.  The patient works as a Medical illustrator for [employer].  The patient drinks 1 or 2 beers per week.  He is not a drinker.  Denies tobacco, he quit 30 years ago.  Denies illicits.  REVIEW OF SYSTEMS:  CONSTITUTIONAL:  Denies fever, chills, night sweats, fatigue, malaise, or weight loss.  CARDIOVASCULAR:  Denies chest pain, shortness of breath, palpitations, orthopnea, nocturia, or PND.  No edema.  RESPIRATORY:  Denies shortness of breath or wheezing. GASTROINTESTINAL:  The patient tells me that he was feeling somewhat nauseated and sick for the last several days.  He lost his appetite. Occasionally, he was seen dark stools, which he thought it was due to the aspirin that he takes.  The patient had no vomiting prior to this admission and no hematochezia.  GENITOURINARY:  Denies dysuria or hematuria.  NEUROLOGIC:  Denies headaches, focal weakness, numbness, or paresthesias.  PHYSICAL EXAMINATION:  GENERAL:  The patient is an age-appropriate Caucasian man who appears comfortable.  He is alert and oriented.  He is pleasant and cooperative. VITAL SIGNS:  Blood pressure 127/88, pulse 82, respirations 12, temperature 98.6, O2 saturation 99%. SKIN and MUCOSA:  No pallor. HEENT:  Unremarkable. NECK:  Supple.  No carotid bruits.  No JVD. HEART:  Regular S1 and S2 without gallops, murmurs, or rubs. LUNGS:  Clear to auscultation. ABDOMEN:  Slightly distended with increased bowel sounds, soft, and nontender.  No organomegaly or masses palpable.  EXTREMITIES:  No clubbing, cyanosis, or edema. NEUROLOGIC:  Nonfocal.  LABORATORY DATA:  WBCs 19.1, hemoglobin 11.4, hematocrit 33.5, platelet count 274.  Three hours later, hemoglobin drop to 10.6, hematocrit 30.4. Sodium 140, potassium 4.5, chloride 107, bicarbonate 33, BUN  33, creatinine 1.0.  Liver function test normal.  PT/INR normal.  PTT normal.  Ionized calcium 1.21.  Cardiac enzymes negative x1.  UA negative.  MEDICAL DECISION MAKING: 28. A 65 year old man with a history of arthralgias, for which he was     taking aspirin and NSAIDs and Protonix; history of hiatal hernia,     status post fundoplication; presenting with a massive upper GI     bleed, for which he underwent emergent upper endoscopy, which     revealed a 5 mm paraesophageal ulcer.  Condition is stable at     present.  He received 2 units of packed RBCs and currently is     receiving IV fluids, D5 half normal saline at 150 cc an hour.     Continue Protonix IV.  Continue Phenergan.  Obtain CBC in a.m. 2. Leukocytosis.  Repeat CBC in a.m.  No signs of infection at this     time. 3. History of hypertension.  Hold losartan and resume only if his     blood pressure becomes uncontrolled above 140/90.          ______________________________ Jonny Ruiz, MD     GL/MEDQ  D:  08/27/2011  T:  08/27/2011  Job:  [id number]  cc:   Lorelle Formosa, M.D. Fax: [phone]  Electronically Signed by Jonny Ruiz MD on 09/22/2011 09:46:12 PM

## 2011-09-24 ENCOUNTER — Inpatient Hospital Stay (HOSPITAL_COMMUNITY)
Admission: AD | Admit: 2011-09-24 | Discharge: 2011-09-30 | DRG: 327 | Disposition: A | Discharge status: Home / Self Care | Payer: Managed Care, Other (non HMO) | Source: Ambulatory Visit | Attending: Surgery | Admitting: Surgery

## 2011-09-24 DIAGNOSIS — K449 Diaphragmatic hernia without obstruction or gangrene: Secondary | ICD-10-CM

## 2011-09-24 DIAGNOSIS — K440 Diaphragmatic hernia with obstruction, without gangrene: Secondary | ICD-10-CM | POA: Diagnosis present

## 2011-09-24 DIAGNOSIS — R110 Nausea: Secondary | ICD-10-CM | POA: Diagnosis not present

## 2011-09-24 DIAGNOSIS — Y838 Other surgical procedures as the cause of abnormal reaction of the patient, or of later complication, without mention of misadventure at the time of the procedure: Secondary | ICD-10-CM | POA: Diagnosis present

## 2011-09-24 DIAGNOSIS — I10 Essential (primary) hypertension: Secondary | ICD-10-CM | POA: Diagnosis present

## 2011-09-24 DIAGNOSIS — E785 Hyperlipidemia, unspecified: Secondary | ICD-10-CM | POA: Diagnosis present

## 2011-09-24 DIAGNOSIS — K660 Peritoneal adhesions (postprocedural) (postinfection): Secondary | ICD-10-CM | POA: Diagnosis present

## 2011-09-24 DIAGNOSIS — K929 Disease of digestive system, unspecified: Principal | ICD-10-CM | POA: Diagnosis present

## 2011-09-25 ENCOUNTER — Inpatient Hospital Stay (HOSPITAL_COMMUNITY): Payer: Managed Care, Other (non HMO)

## 2011-09-25 LAB — CBC
HCT: 33.8 % — ABNORMAL LOW (ref 39.0–52.0)
MCV: 88.9 fL (ref 78.0–100.0)
RBC: 3.8 MIL/uL — ABNORMAL LOW (ref 4.22–5.81)
WBC: 8.7 10*3/uL (ref 4.0–10.5)

## 2011-09-25 LAB — DIFFERENTIAL
Eosinophils Relative: 0 % (ref 0–5)
Lymphocytes Relative: 5 % — ABNORMAL LOW (ref 12–46)
Lymphs Abs: 0.5 10*3/uL — ABNORMAL LOW (ref 0.7–4.0)
Monocytes Relative: 7 % (ref 3–12)

## 2011-09-25 MED ORDER — IOHEXOL 300 MG/ML  SOLN
150.0000 mL | Freq: Once | INTRAMUSCULAR | Status: AC | PRN
Start: 2011-09-25 — End: 2011-09-25
  Administered 2011-09-25: 100 mL via ORAL

## 2011-09-27 LAB — DIFFERENTIAL
Basophils Absolute: 0 10*3/uL (ref 0.0–0.1)
Basophils Relative: 0 % (ref 0–1)
Eosinophils Absolute: 0.1 10*3/uL (ref 0.0–0.7)
Monocytes Relative: 13 % — ABNORMAL HIGH (ref 3–12)
Neutro Abs: 5.3 10*3/uL (ref 1.7–7.7)
Neutrophils Relative %: 74 % (ref 43–77)

## 2011-09-27 LAB — CBC
Hemoglobin: 11 g/dL — ABNORMAL LOW (ref 13.0–17.0)
Platelets: 172 10*3/uL (ref 150–400)
RBC: 3.69 MIL/uL — ABNORMAL LOW (ref 4.22–5.81)
WBC: 7.2 10*3/uL (ref 4.0–10.5)

## 2011-09-27 MED ORDER — NALOXONE HCL 0.4 MG/ML IJ SOLN: 0.4000 mg | INTRAMUSCULAR | Status: DC | PRN

## 2011-09-27 MED ORDER — CHLORHEXIDINE GLUCONATE CLOTH 2 % EX PADS
6.0000 | MEDICATED_PAD | Freq: Every day | CUTANEOUS | Status: AC
  Filled 2011-09-27 (×2): qty 6

## 2011-09-27 MED ORDER — PROMETHAZINE HCL 25 MG/ML IJ SOLN: 12.5000 mg | Freq: Four times a day (QID) | INTRAMUSCULAR | Status: DC | PRN

## 2011-09-27 MED ORDER — BIOTENE DRY MOUTH MT LIQD
15.0000 mL | OROMUCOSAL | Status: DC
  Administered 2011-09-29: 15 mL via OROMUCOSAL
  Filled 2011-09-27 (×6): qty 15

## 2011-09-27 MED ORDER — ONDANSETRON HCL 4 MG/2ML IJ SOLN: 4.0000 mg | Freq: Four times a day (QID) | INTRAMUSCULAR | Status: DC | PRN

## 2011-09-27 MED ORDER — SODIUM CHLORIDE 0.9 % IJ SOLN: 9.0000 mL | INTRAMUSCULAR | Status: DC | PRN

## 2011-09-27 MED ORDER — PROCHLORPERAZINE EDISYLATE 5 MG/ML IJ SOLN: 5.0000 mg | Freq: Four times a day (QID) | INTRAMUSCULAR | Status: DC | PRN

## 2011-09-27 MED ORDER — KCL IN DEXTROSE-NACL 20-5-0.45 MEQ/L-%-% IV SOLN
INTRAVENOUS | Status: DC
  Filled 2011-09-27 (×5): qty 1000

## 2011-09-27 MED ORDER — FLUTICASONE PROPIONATE 50 MCG/ACT NA SUSP: 2.0000 | Freq: Every day | NASAL | Status: DC | PRN

## 2011-09-27 MED ORDER — MUPIROCIN 2 % EX OINT: TOPICAL_OINTMENT | Freq: Two times a day (BID) | CUTANEOUS | Status: AC

## 2011-09-27 MED ORDER — HYDROMORPHONE 0.3 MG/ML IV SOLN
INTRAVENOUS | Status: DC
  Administered 2011-09-29 – 2011-09-30 (×2): 0 mg via INTRAVENOUS

## 2011-09-27 MED ORDER — DIPHENHYDRAMINE HCL 12.5 MG/5ML PO ELIX: 25.0000 mg | ORAL_SOLUTION | Freq: Four times a day (QID) | ORAL | Status: DC | PRN

## 2011-09-27 MED ORDER — HEPARIN SODIUM (PORCINE) 5000 UNIT/ML IJ SOLN
5000.0000 [IU] | Freq: Three times a day (TID) | INTRAMUSCULAR | Status: DC
  Administered 2011-09-29 – 2011-09-30 (×3): 5000 [IU] via SUBCUTANEOUS
  Filled 2011-09-27 (×10): qty 1

## 2011-09-27 MED ORDER — DIPHENHYDRAMINE HCL 50 MG/ML IJ SOLN: 12.5000 mg | Freq: Four times a day (QID) | INTRAMUSCULAR | Status: DC | PRN

## 2011-09-27 MED ORDER — CHLORHEXIDINE GLUCONATE 0.12 % MT SOLN
15.0000 mL | Freq: Two times a day (BID) | OROMUCOSAL | Status: DC
  Administered 2011-09-29 – 2011-09-30 (×3): 15 mL via OROMUCOSAL
  Filled 2011-09-27 (×7): qty 15

## 2011-09-29 LAB — CBC
HCT: 37.8 % — ABNORMAL LOW (ref 39.0–52.0)
RBC: 4.26 MIL/uL (ref 4.22–5.81)
RDW: 12.8 % (ref 11.5–15.5)
WBC: 6.7 10*3/uL (ref 4.0–10.5)

## 2011-09-29 LAB — DIFFERENTIAL
Basophils Absolute: 0 10*3/uL (ref 0.0–0.1)
Lymphocytes Relative: 18 % (ref 12–46)
Monocytes Absolute: 0.8 10*3/uL (ref 0.1–1.0)
Neutro Abs: 4.3 10*3/uL (ref 1.7–7.7)

## 2011-09-29 MED ORDER — METOCLOPRAMIDE HCL 5 MG/ML IJ SOLN
INTRAMUSCULAR | Status: AC
  Administered 2011-09-29: 5 mg via INTRAVENOUS
  Filled 2011-09-29: qty 2

## 2011-09-29 MED ORDER — METOCLOPRAMIDE HCL 5 MG/ML IJ SOLN
5.0000 mg | Freq: Three times a day (TID) | INTRAMUSCULAR | Status: DC | PRN
  Administered 2011-09-29: 5 mg via INTRAVENOUS

## 2011-09-29 NOTE — Progress Notes (Signed)
Son would like to be notified with updates and called if at all possible. Also wanted to be able to call and receive medical information. Medical release form signed and placed in chart. Password assigned [email or web address].

## 2011-09-29 NOTE — Progress Notes (Signed)
   *   No surgery found *  Subjective: [name] [name] reports nausea yesterday. He had some retching with this which was controlled with Phenergan. He has taken a few liquids. I think he's been drinking cooler liquids which may be aggravating esophageal spasm.  I have discussed adding Reglan to his regimen. He has taken Reglan before and tolerated it well. He is aware of the drug in its other issues. He advised also to try warm liquids within cool liquids. I do not feel he can manage this nausea home and will go and keep him in the hospital at least tomorrow.  Objective: Vital signs in last 24 hours: Temp:  [98.1 F (36.7 C)-99.5 F (37.5 C)] 98.2 F (36.8 C) (11/04 0513) Pulse Rate:  [82-89] 82  (11/04 0513) Resp:  [16-18] 16  (11/04 0513) BP: (136-148)/(80-83) 136/82 mmHg (11/04 0513) SpO2:  [93 %-94 %] 93 % (11/04 0513)   Intake/Output from previous day: 11/03 0701 - 11/04 0700 In: 959.8 [I.V.:959.8] Out: 975 [Urine:975] Intake/Output this shift:    Incision/Wound: incisions doing well.  Lab Results:   [facility] 09/27/11 0345  WBC 7.2  HGB 11.0*  HCT 33.1*  PLT 172   BMET No results found for this basename: NA:2,K:2,CL:2,CO2:2,GLUCOSE:2,BUN:2,CREATININE:2,CALCIUM:2 in the last 72 hours PT/INR No results found for this basename: LABPROT:2,INR:2 in the last 72 hours  Studies/Results: No results found.  Anti-infectives: Anti-infectives    None      Assessment/Plan: Will add Reglan to regiment to control nausea. Continue full liquids concentrating on warmer liquids. I think his nausea is related to his persistent swelling as he had a lot more dissection from this redo. * No surgery found *    LOS: 5 days    [name] B. [name] Deutscher, MD, [facility] [phone] beeper [phone]  09/29/2011 7:01 AM

## 2011-09-29 NOTE — Op Note (Signed)
[name], [name] NO.:  [id number]  MEDICAL RECORD NO.:  [id number]  LOCATION:  1231                         FACILITY:  [facility]  PHYSICIAN:  [name] Park. Daphine Deutscher, MD  DATE OF BIRTH:  04-05-1946  DATE OF PROCEDURE: DATE OF DISCHARGE:                              OPERATIVE REPORT   PREOPERATIVE DIAGNOSIS:  Herniated Nissen wrap, previously done by Dr. Mosetta [name] in 1992.  FINDINGS:  Herniation of fundus above the wrap into the chest, incarcerated.  PROCEDURE:  Laparoscopic adhesiolysis, takedown of incarcerated herniated wrap into the chest, repair of the diaphragm with 4 sutures posteriorly to the right and left crura, an anterior onlay of Cook biomesh, redo Nissen fundoplication over a #56 lighted bougie using 3- suture technique (operative time 4 hours).  SURGEON:  [name] Park. Daphine [name], M.D.  ASSISTANT:  Dr. Ezzard [name].  ANESTHESIA:  General endotracheal.  DESCRIPTION OF PROCEDURE:  [name] [name] had Nissen by Dr. Mosetta [name] in the early 90s.  [name] [name] with some pain and had Cameron ulcers up in a herniated pouch, seen endoscopically.  Upper GI was very remarkable and [name] had almost a double bubble sign with a twisted area above his diaphragm.  The patient was taken to room #1, given general anesthesia.  A time-out was performed.  This was after [name] was prepped with PCMX and draped sterilely.  Access to the abdomen was achieved through the left upper quadrant using a 5 mm 0 degree Optiview, percutaneous technique, inserting this into the abdomen, inflating with carbon dioxide.  A 5 mm angle scope was used, [name] had a lot of adhesions from his previous midline laparotomy incision.  I went in with a smaller 5 mm and took down these adhesions with blunt and Harmonic dissection, carrying this up to the point where I could put in the [facility] retractor.  [name] previously had his left lateral segment triangular ligament taken down and I was able to get up to  that point and then detached the liver from the wrapped stomach.  In doing this, I was able to identify the right crus and then dissect it up into the chest.  I was certainly able to bring down this herniated sac, which came down very strikingly out of the chest.  It was very large.  Once down, I took down the old wrap and brought this down and reoriented the anatomy.  I freed up the crura in their entirety.  We did not feel we created any enterotomies, everything looked good.  I repaired the diaphragm posteriorly taking interrupted simple bites with 0-Surgidac in the EndoStitch, tying these extracorporeally, placing 4 such sutures and then passing a lighted 56 bougie to calibrated.  This was snugged with a 56 in place.  Following this, I had enough of the stomach free to form a wrap, but prior to doing this I repaired further anteriorly using a piece of the [facility], which I cut to waist anteriorly and the pant's legs coming down posteriorly on either side of the crura.  This was sutured in place with a free endoscopic stitch of 2- 0 Surgidac, held in place with tie knots.  With  the 56 in place, the stomach was then invaginated with a fundoplication using 3 sutures using the EndoStitch, getting purchases of the periesophageal tissue and then tying these down with tie knots. Tisseel was applied beneath and on top of the wrap.  Everything looked good and the bougie was removed without difficulty.  Wounds were all injected with 0.25% Marcaine and closed with 4-0 Vicryl with benzoin and Steri-Strips.  The patient tolerated the procedure well and was taken to recovery room in satisfactory condition.     [name] Park Daphine Deutscher, MD     MBM/MEDQ  D:  09/24/2011  T:  09/25/2011  Job:  [id number]  cc:   [name] Morton. Juanda Chance, MD [address] [place] [address]  Electronically Signed by [name] Murphy MD on 09/29/2011 07:21:50 AM

## 2011-09-30 MED ORDER — PROMETHAZINE HCL 6.25 MG/5ML PO SYRP: 12.5000 mg | ORAL_SOLUTION | Freq: Four times a day (QID) | ORAL | Status: AC | PRN

## 2011-09-30 MED ORDER — METOCLOPRAMIDE HCL 5 MG PO TABS: 5.0000 mg | ORAL_TABLET | Freq: Four times a day (QID) | ORAL | Status: AC

## 2011-09-30 NOTE — Progress Notes (Signed)
   *   No surgery found *  Subjective: Feeling much better.  No nausea.  Will give meds to cover in case of nausea.    Objective: Vital signs in last 24 hours: Temp:  [97.9 F (36.6 C)-98.1 F (36.7 C)] 97.9 F (36.6 C) (11/05 0500) Pulse Rate:  [77-82] 79  (11/05 0500) Resp:  [14-93] 16  (11/05 0800) BP: (120-144)/(76-79) 120/76 mmHg (11/05 0500) SpO2:  [12 %-98 %] 98 % (11/05 0800)   Intake/Output from previous day: 11/04 0701 - 11/05 0700 In: 2071.7 [P.O.:1000; I.V.:1071.7] Out: 1000 [Urine:1000] Intake/Output this shift: Total I/O In: 664.9 [I.V.:664.9] Out: -   wounds ok  Lab Results:   [facility] 09/29/11 0753  WBC 6.7  HGB 12.5*  HCT 37.8*  PLT 222   BMET No results found for this basename: NA:2,K:2,CL:2,CO2:2,GLUCOSE:2,BUN:2,CREATININE:2,CALCIUM:2 in the last 72 hours PT/INR No results found for this basename: LABPROT:2,INR:2 in the last 72 hours  Studies/Results: No results found.  Anti-infectives: Anti-infectives    None      Assessment/Plan: Discharge * No surgery found *    LOS: 6 days    Matt B. Daphine Deutscher, MD, [facility] [phone] beeper [phone]  09/30/2011 9:57 AM

## 2011-09-30 NOTE — Plan of Care (Signed)
Problem: Phase I Progression Outcomes Goal: Pain controlled with appropriate interventions Outcome: Progressing Pt decreasing usage of PCA pump.  Pt reports "less pain now"

## 2011-09-30 NOTE — Progress Notes (Signed)
Discharge instructions reviewed with pt and pt verbalized understanding.  No comments or concerns voiced by pt at this time.

## 2011-09-30 NOTE — Discharge Summary (Signed)
Date of admission 09/24/2011. To discharge 09/29/2011  Admitting diagnosis his herniated Nissen fundoplication from prior wrap done open in the early 1990s  Procedure laparoscopic takedown of incarcerated herniated stomach above the wrap, repair of diaphragm with mesh (biologic) Course in the hospital Mr. Mercier is a 65 year old gentleman who underwent the above-mentioned operation. Postoperative day #1 he swallow which showed his wrap was intact where it went where it needed to be in and looked good. Laboratory looked good as well. He had a lot of dissection since this was a redo and anticipate a lot of edema. This led to a slightly prolonged stay with him not ready to go home until November 4. At that time his nausea had abated. He will go home on Reglan as needed for nausea and had Phenergan as a backup.  Condition good discharge arrangements made for followup in about 3 weeks in the office.

## 2011-09-30 NOTE — Plan of Care (Signed)
Problem: Phase II Progression Outcomes Goal: Progressing with IS, TCDB Outcome: Progressing I/S usage while awake

## 2011-10-10 ENCOUNTER — Encounter (INDEPENDENT_AMBULATORY_CARE_PROVIDER_SITE_OTHER): Payer: Self-pay | Admitting: Surgery

## 2011-10-10 ENCOUNTER — Ambulatory Visit (INDEPENDENT_AMBULATORY_CARE_PROVIDER_SITE_OTHER): Payer: Managed Care, Other (non HMO) | Admitting: Surgery

## 2011-10-10 ENCOUNTER — Other Ambulatory Visit (INDEPENDENT_AMBULATORY_CARE_PROVIDER_SITE_OTHER): Payer: Self-pay | Admitting: Surgery

## 2011-10-10 VITALS — BP 128/74 | HR 84 | Temp 98.0°F | Resp 16 | Ht 71.0 in | Wt 185.5 lb

## 2011-10-10 DIAGNOSIS — D649 Anemia, unspecified: Secondary | ICD-10-CM

## 2011-10-10 DIAGNOSIS — K219 Gastro-esophageal reflux disease without esophagitis: Secondary | ICD-10-CM

## 2011-10-10 NOTE — Progress Notes (Signed)
[name] [name] comes in today 2 weeks after his redo lap Nissen. [name] had done his open Nissen narrowing 90s. So far he is still pretty tight and taking a pured-type diet. His incision to feel fine. He is fairly tired. All get a CBC on him and to check his hemoglobin since he did have anemia from [facility] ulcers. I will see him back in about 6 weeks. Condition improved

## 2011-10-10 NOTE — Progress Notes (Signed)
Addended by: Latricia Heft on: 10/10/2011 12:16 PM   Modules accepted: Orders

## 2011-10-11 LAB — CBC
HCT: 38.9 % — ABNORMAL LOW (ref 39.0–52.0)
Hemoglobin: 12.4 g/dL — ABNORMAL LOW (ref 13.0–17.0)
MCHC: 31.9 g/dL (ref 30.0–36.0)
WBC: 6 10*3/uL (ref 4.0–10.5)

## 2011-11-08 ENCOUNTER — Ambulatory Visit (INDEPENDENT_AMBULATORY_CARE_PROVIDER_SITE_OTHER): Payer: Managed Care, Other (non HMO) | Admitting: Surgery

## 2011-11-08 ENCOUNTER — Encounter (INDEPENDENT_AMBULATORY_CARE_PROVIDER_SITE_OTHER): Payer: Self-pay | Admitting: Surgery

## 2011-11-08 VITALS — BP 158/84 | HR 88 | Temp 99.0°F | Resp 18 | Ht 71.0 in | Wt 179.2 lb

## 2011-11-08 DIAGNOSIS — Z9889 Other specified postprocedural states: Secondary | ICD-10-CM

## 2011-11-08 DIAGNOSIS — Z09 Encounter for follow-up examination after completed treatment for conditions other than malignant neoplasm: Secondary | ICD-10-CM

## 2011-11-08 NOTE — Progress Notes (Signed)
PREOPERATIVE DIAGNOSIS: Herniated Nissen wrap, previously done by Dr.  Mosetta Anis in 1992.  FINDINGS: Herniation of fundus above the wrap into the chest,  incarcerated.  PROCEDURE: Laparoscopic adhesiolysis, takedown of incarcerated  herniated wrap into the chest, repair of the diaphragm with 4 sutures  posteriorly to the right and left crura, an anterior onlay of Cook  biomesh, redo Nissen fundoplication over a #56 lighted bougie using 3-  suture technique (operative time 4 hours).  [name] [name] fell and grabbed himself [name] and felt some sharp pull and pains in his left upper quadrant. This is at the side of his a 5 mm trocar. It was a sharp lancing pain and I think it was related to pulling those muscles of the trocar site. I did repair his repair but did not do a gastropexy several pictures anything there that was pulling. He had laboratory done within the last week and his hemoglobin was over 12.he is not having any heartburn.  His pulse rate is in the 80s and I think he's okay. He is having more burping. Plan we'll see back in 4-6 weeks.

## 2011-11-08 NOTE — Patient Instructions (Signed)
Followup with Dr. Daphine Deutscher in 4-6 weeks

## 2011-11-15 ENCOUNTER — Telehealth (INDEPENDENT_AMBULATORY_CARE_PROVIDER_SITE_OTHER): Payer: Self-pay | Admitting: General Surgery

## 2011-11-15 NOTE — Telephone Encounter (Signed)
The patient left a voicemail requesting that he be released to return to work for full duty. Per Dr Daphine Deutscher okay to return to work at full duty, left a message for the patient to call with a date he would like to return.

## 2011-11-20 ENCOUNTER — Encounter (INDEPENDENT_AMBULATORY_CARE_PROVIDER_SITE_OTHER): Payer: Self-pay | Admitting: General Surgery

## 2011-12-18 ENCOUNTER — Encounter (INDEPENDENT_AMBULATORY_CARE_PROVIDER_SITE_OTHER): Payer: Self-pay | Admitting: Surgery

## 2011-12-18 ENCOUNTER — Ambulatory Visit (INDEPENDENT_AMBULATORY_CARE_PROVIDER_SITE_OTHER): Payer: Managed Care, Other (non HMO) | Admitting: Surgery

## 2011-12-18 VITALS — BP 140/72 | HR 84 | Temp 98.2°F | Resp 18 | Ht 71.0 in | Wt 185.6 lb

## 2011-12-18 DIAGNOSIS — Z931 Gastrostomy status: Secondary | ICD-10-CM
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
## 2012-08-12 ENCOUNTER — Telehealth: Payer: Self-pay | Admitting: Internal Medicine

## 2012-08-13 NOTE — Telephone Encounter (Signed)
Spoke with patient's wife and he will call me back to discuss his problems.

## 2012-08-13 NOTE — Telephone Encounter (Signed)
Spoke with patient and he reports sharp pain off and on in LUQ adbomen x 1 month. States he notices the pain off and on during the day. Just does not feel well. States when he drinks a glass of wine at night it is better. He reports he had an ulcer in Oct. 2012 and is concerned. S/P Nissen fundoplication. 08/31/11- EGD ulcer @ Nissen wrap, blood in greater curvature of stomach. 09/07/11- repair of herniated  Nissen  Fundoplication. Scheduled with Mike Gip, PA 08/14/12 at 2:00 PM.

## 2012-08-13 NOTE — Telephone Encounter (Signed)
I agree with him seeing [name] [name]. May need UGI series or EGD. PPI's.

## 2012-08-14 ENCOUNTER — Encounter: Payer: Self-pay | Admitting: Physician Assistant

## 2012-08-14 ENCOUNTER — Ambulatory Visit (INDEPENDENT_AMBULATORY_CARE_PROVIDER_SITE_OTHER): Payer: Managed Care, Other (non HMO) | Admitting: Physician Assistant

## 2012-08-14 VITALS — BP 144/70 | HR 76 | Ht 70.0 in | Wt 191.8 lb

## 2012-08-14 DIAGNOSIS — K2211 Ulcer of esophagus with bleeding: Secondary | ICD-10-CM | POA: Insufficient documentation

## 2012-08-14 DIAGNOSIS — R1013 Epigastric pain: Secondary | ICD-10-CM

## 2012-08-14 DIAGNOSIS — Z8719 Personal history of other diseases of the digestive system: Secondary | ICD-10-CM | POA: Insufficient documentation

## 2012-08-14 DIAGNOSIS — R110 Nausea: Secondary | ICD-10-CM

## 2012-08-14 MED ORDER — DEXLANSOPRAZOLE 60 MG PO CPDR: 60.0000 mg | DELAYED_RELEASE_CAPSULE | Freq: Every day | ORAL | Status: DC

## 2012-08-14 NOTE — Progress Notes (Signed)
Subjective:    Patient ID: [name] [name], male    DOB: 1946/09/07, 66 y.o.   MRN: [id number]  HPI [name] [name] is a pleasant 66 year old white male known to Dr. Lina Sar who had undergone a very remote Nissen fundoplication and then presented last fall to the emergency room with an acute upper GI bleed. He had emergent EGD done by Dr. Marina Goodell 08/27/2011 with finding of a paraesophageal hernia and an ulcer at the Nissen wrap were fully within the paraesophageal hernia. He ultimately underwent redo Nissen fundoplication per Dr. Daphine Deutscher in October 2012 and has done well since. He comes to the office today with complaints of mild epigastric discomfort and increased gas and belching over the past one to 2 months. He says his symptoms are intermittent and not necessarily associated with eating. He has not had any vomiting his appetite is fine his weight has been stable. He has no complaints of dysphagia or odynophagia. His bowel habits have also been normal. He has not been on any aspirin or NSAIDs. Earlier this week he started taking some Zantac and feels that this helped a little bit. He is concerned because of the acute upper GI bleed he had last fall whether he may have another ulcer.    Review of Systems  Constitutional: Negative.   HENT: Negative.   Eyes: Negative.   Respiratory: Negative.   Cardiovascular: Negative.   Gastrointestinal: Positive for nausea and abdominal pain.  Genitourinary: Negative.   Musculoskeletal: Negative.   Neurological: Negative.   Hematological: Negative.   Psychiatric/Behavioral: Negative.    Outpatient Prescriptions Prior to Visit  Medication Sig Dispense Refill  . cetirizine (ZYRTEC) 10 MG tablet Take 10 mg by mouth daily.        . chlorpheniramine-HYDROcodone (TUSSIONEX) 10-8 MG/5ML LQCR Take 5 mLs by mouth Every 12 hours as needed. For cough.      . cholecalciferol (VITAMIN D) 1000 UNITS tablet Take 1,000 Units by mouth daily.        . fluticasone (FLONASE) 50  MCG/ACT nasal spray Place 2 sprays into the nose daily as needed. For allergies.      [name] losartan (COZAAR) 50 MG tablet Take 1 tablet by mouth At bedtime.      . Multiple Vitamins-Minerals (MULTIVITAMINS THER. W/MINERALS) TABS Take 2 tablets by mouth daily.        . Omega-3 Fatty Acids (FISH OIL BURP-LESS PO) Take 1 capsule by mouth daily.        . ondansetron (ZOFRAN) 4 MG tablet Take 1 tablet by mouth Every 6 hours as needed. For nausea.      . pantoprazole (PROTONIX) 40 MG tablet Ad lib.           Allergies  Allergen Reactions  . Percocet (Oxycodone-Acetaminophen) Nausea And Vomiting  . Sulfonamide Derivatives Hives   Patient Active Problem List  Diagnosis  . S/P Nissen fundoplication (without gastrostomy tube) procedure  . H/O: GI bleed  . Ulcer of esophagus with bleeding   History   Social History  . Marital Status: Married    Spouse Name: N/A    Number of Children: 2  . Years of Education: N/A   Occupational History  . sales    Social History Main Topics  . Smoking status: Former Games developer  . Smokeless tobacco: Never Used  . Alcohol Use: No  . Drug Use: No  . Sexually Active: Not on file   Other Topics Concern  . Not on file   Social History  Narrative   2 daily caffeine drinks    Objective:   Physical Exam well-developed white male in no acute distress, pleasant blood pressure 144/70 pulse 76 height 5 foot 10 weight 191. HEENT; nontraumatic normocephalic EOMI PERRLA, sclera anicteric, Supple no JVD, Cardiovascular; regular rate and rhythm with S1-S2 no murmur rub or gallop, Pulmonary ;clear bilaterally, Abdomen; soft, bowel sounds are active any appreciable epigastric tenderness or other abdominal tenderness, bowel sounds are ;no clubbing cyanosis or edema skin warm and dry, Psych; mood and affect normal and appropriate       Assessment & Plan:  #33  66 year old male status post remote Nissen fundoplication, presented fall 2012 with an acute major upper GI bleed  secondary to an ulcer located within the wrap and within a paraesophageal hernia. Patient underwent redo Nissen fundoplication October 2012. Now with 1-2 month history of intermittent mild epigastric discomfort gas and intermittent nausea.  Will rule out peptic ulcer disease, gastropathy, gallbladder disease.  #2 mild diverticulosis sigmoid colon, up to date with: Screening last colonoscopy February 2012  Plan; will give him a trial of Exelon 60 mg by mouth daily, samples given today. This will be in place of the Zantac. Schedule for upper abdominal ultrasound. Schedule for upper endoscopy with Dr. Juanda Chance. Procedure discussed in detail with the patient and he is agreeable to proceed.

## 2012-08-14 NOTE — Patient Instructions (Addendum)
We have scheduled the Ultrasound at [facility]. It is Thurs 9-26, arrive at 8:15 AM. Have nothing to eat  We have given you samples of Dexilant 60 mg.  Take 1 tablet 30 min before breakfast. We scheduled the Endoscopy with Dr. Lina Sar for 08-26-2012 at 11:30 Am . Upper GI Endoscopy Upper GI endoscopy means using a flexible scope to look at the esophagus, stomach, and upper small bowel. This is done to make a diagnosis in people with heartburn, abdominal pain, or abnormal bleeding. Sometimes an endoscope is needed to remove foreign bodies or food that become stuck in the esophagus; it can also be used to take biopsy samples. For the best results, do not eat or drink for 8 hours before having your upper endoscopy.  To perform the endoscopy, you will probably be sedated and your throat will be numbed with a special spray. The endoscope is then slowly passed down your throat (this will not interfere with your breathing). An endoscopy exam takes 15 to 30 minutes to complete and there is no real pain. Patients rarely remember much about the procedure. The results of the test may take several days if a biopsy or other test is taken.  You may have a sore throat after an endoscopy exam. Serious complications are very rare. Stick to liquids and soft foods until your pain is better. Do not drive a car or operate any dangerous equipment for at least 24 hours after being sedated. SEEK IMMEDIATE MEDICAL CARE IF:   You have severe throat pain.   You have shortness of breath.   You have bleeding problems.   You have a fever.   You have difficulty recovering from your sedation.  Document Released: 12/19/2004 Document Revised: 10/31/2011 Document Reviewed: 11/13/2008 [facility] Patient Information 2012 [place], [place].

## 2012-08-16 NOTE — Progress Notes (Signed)
Agree with Ms. Oswald Hillock assessment and plan. Empiric Dexilant, [place] and EGD. Iva Boop, MD, [facility]

## 2012-08-20 ENCOUNTER — Ambulatory Visit (HOSPITAL_COMMUNITY)
Admission: RE | Admit: 2012-08-20 | Discharge: 2012-08-20 | Disposition: A | Discharge status: Home / Self Care | Payer: Managed Care, Other (non HMO) | Source: Ambulatory Visit | Attending: Physician Assistant | Admitting: Physician Assistant

## 2012-08-20 DIAGNOSIS — K219 Gastro-esophageal reflux disease without esophagitis: Secondary | ICD-10-CM | POA: Insufficient documentation

## 2012-08-20 DIAGNOSIS — R1013 Epigastric pain: Secondary | ICD-10-CM

## 2012-08-20 DIAGNOSIS — R110 Nausea: Secondary | ICD-10-CM

## 2012-08-20 DIAGNOSIS — N281 Cyst of kidney, acquired: Secondary | ICD-10-CM | POA: Insufficient documentation

## 2012-08-20 DIAGNOSIS — I10 Essential (primary) hypertension: Secondary | ICD-10-CM | POA: Insufficient documentation

## 2012-08-26 ENCOUNTER — Encounter: Payer: Self-pay | Admitting: Internal Medicine

## 2012-08-26 ENCOUNTER — Ambulatory Visit (AMBULATORY_SURGERY_CENTER): Payer: Managed Care, Other (non HMO) | Admitting: Internal Medicine

## 2012-08-26 ENCOUNTER — Telehealth: Payer: Self-pay | Admitting: *Deleted

## 2012-08-26 VITALS — BP 150/87 | HR 70 | Temp 96.1°F | Resp 17 | Ht 70.0 in | Wt 191.0 lb

## 2012-08-26 DIAGNOSIS — Z09 Encounter for follow-up examination after completed treatment for conditions other than malignant neoplasm: Secondary | ICD-10-CM

## 2012-08-26 DIAGNOSIS — R1013 Epigastric pain: Secondary | ICD-10-CM

## 2012-08-26 DIAGNOSIS — K2211 Ulcer of esophagus with bleeding: Secondary | ICD-10-CM

## 2012-08-26 DIAGNOSIS — Z9889 Other specified postprocedural states: Secondary | ICD-10-CM

## 2012-08-26 MED ORDER — DICYCLOMINE HCL 20 MG PO TABS: 20.0000 mg | ORAL_TABLET | Freq: Two times a day (BID) | ORAL | Status: DC

## 2012-08-26 MED ORDER — SODIUM CHLORIDE 0.9 % IV SOLN: 500.0000 mL | INTRAVENOUS | Status: DC

## 2012-08-26 NOTE — Progress Notes (Signed)
Patient did not experience any of the following events: a burn prior to discharge; a fall within the facility; wrong site/side/patient/procedure/implant event; or a hospital transfer or hospital admission upon discharge from the facility. (G8907) Patient did not have preoperative order for IV antibiotic SSI prophylaxis. ([id number])  

## 2012-08-26 NOTE — Telephone Encounter (Signed)
Patient given date, time and instructions for UGI.

## 2012-08-26 NOTE — Telephone Encounter (Signed)
Scheduled UGI at [facility] radiology  On 08/31/12 at 9:45/10:00 AM. NPO after midnight. ([name] [name]) Left a message for patient to call me.

## 2012-08-26 NOTE — Patient Instructions (Addendum)
YOU HAD AN ENDOSCOPIC PROCEDURE TODAY AT THE [facility]: Refer to the procedure report that was given to you for any specific questions about what was found during the examination.  If the procedure report does not answer your questions, please call your gastroenterologist to clarify.  If you requested that your care partner not be given the details of your procedure findings, then the procedure report has been included in a sealed envelope for you to review at your convenience later.  YOU SHOULD EXPECT: Some feelings of bloating in the abdomen. Passage of more gas than usual.  Walking can help get rid of the air that was put into your GI tract during the procedure and reduce the bloating. If you had a lower endoscopy (such as a colonoscopy or flexible sigmoidoscopy) you may notice spotting of blood in your stool or on the toilet paper. If you underwent a bowel prep for your procedure, then you may not have a normal bowel movement for a few days.  DIET: Your first meal following the procedure should be a light meal and then it is ok to progress to your normal diet.  A half-sandwich or bowl of soup is an example of a good first meal.  Heavy or fried foods are harder to digest and may make you feel nauseous or bloated.  Likewise meals heavy in dairy and vegetables can cause extra gas to form and this can also increase the bloating.  Drink plenty of fluids but you should avoid alcoholic beverages for 24 hours.  ACTIVITY: Your care partner should take you home directly after the procedure.  You should plan to take it easy, moving slowly for the rest of the day.  You can resume normal activity the day after the procedure however you should NOT DRIVE or use heavy machinery for 24 hours (because of the sedation medicines used during the test).    SYMPTOMS TO REPORT IMMEDIATELY: A gastroenterologist can be reached at any hour.  During normal business hours, 8:30 AM to 5:00 PM Monday through Friday,  call [phone].  After hours and on weekends, please call the GI answering service at [phone] who will take a message and have the physician on call contact you.   Following lower endoscopy (colonoscopy or flexible sigmoidoscopy):  Excessive amounts of blood in the stool  Significant tenderness or worsening of abdominal pains  Swelling of the abdomen that is new, acute  Fever of 100F or higher  Following upper endoscopy (EGD)  Vomiting of blood or coffee ground material  New chest pain or pain under the shoulder blades  Painful or persistently difficult swallowing  New shortness of breath  Fever of 100F or higher  Black, tarry-looking stools  FOLLOW UP: If any biopsies were taken you will be contacted by phone or by letter within the next 1-3 weeks.  Call your gastroenterologist if you have not heard about the biopsies in 3 weeks.  Our staff will call the home number listed on your records the next business day following your procedure to check on you and address any questions or concerns that you may have at that time regarding the information given to you following your procedure. This is a courtesy call and so if there is no answer at the home number and we have not heard from you through the emergency physician on call, we will assume that you have returned to your regular daily activities without incident.  SIGNATURES/CONFIDENTIALITY: You and/or your care   partner have signed paperwork which will be entered into your electronic medical record.  These signatures attest to the fact that that the information above on your After Visit Summary has been reviewed and is understood.  Full responsibility of the confidentiality of this discharge information lies with you and/or your care-partner.  

## 2012-08-26 NOTE — Op Note (Signed)
[facility] [address] [place], [address]   ENDOSCOPY PROCEDURE REPORT  PATIENT: [name], [name]  MR#: [id number] BIRTHDATE: 1946/04/16 , 66  yrs. old GENDER: Male ENDOSCOPIST: Hart Carwin, MD REFERRED BY:  Thayer Headings, M.D. PROCEDURE DATE:  08/26/2012 PROCEDURE:  EGD, diagnostic ASA CLASS:     Class II INDICATIONS:  Nissen fundiplication 2005, undone Oct 2012, presented with an UGI bleed from an ulcer in paraesophageal hernia,.underwent re-do of the wrap Oct 2012, MEDICATIONS: MAC sedation, administered by CRNA and Propofol (Diprivan) 150 mg IV TOPICAL ANESTHETIC: Cetacaine Spray  DESCRIPTION OF PROCEDURE: After the risks benefits and alternatives of the procedure were thoroughly explained, informed consent was obtained.  The LB [id number] endoscope was introduced through the mouth and advanced to the second portion of the duodenum. Without limitations.  The instrument was slowly withdrawn as the mucosa was fully examined.    The upper, middle and distal third of the esophagus were carefully inspected and no abnormalities were noted.  The z-line was well seen at the GEJ.  The endoscope was pushed into the fundus which was normal including a retroflexed view.Functioning Nissen Fundoplication was noted  on retroflexed view, no evidence of paraesophageal hernia  The antrum, gastric body, first and second part of the duodenum were unremarkable.  Retroflexed views revealed no abnormalities.     The scope was then withdrawn from the patient and the procedure completed.  COMPLICATIONS: There were no complications. ENDOSCOPIC IMPRESSION: Normal EGD Functioning Nissen fundoplication re-do from Oct 2012 no recurrent ulcer  RECOMMENDATIONS: continue Dexilant if pain continues ,consider UGI series  REPEAT EXAM: [No recall  eSigned:  Hart Carwin, MD 08/26/2012 12:06 PM   CC:

## 2012-08-27 ENCOUNTER — Telehealth: Payer: Self-pay | Admitting: *Deleted

## 2012-08-27 NOTE — Telephone Encounter (Signed)
No answer. Left message to call if questions or concerns. 

## 2012-08-31 ENCOUNTER — Ambulatory Visit (HOSPITAL_COMMUNITY)
Admission: RE | Admit: 2012-08-31 | Discharge: 2012-08-31 | Disposition: A | Discharge status: Home / Self Care | Payer: Managed Care, Other (non HMO) | Source: Ambulatory Visit | Attending: Internal Medicine | Admitting: Internal Medicine

## 2012-08-31 DIAGNOSIS — K228 Other specified diseases of esophagus: Secondary | ICD-10-CM | POA: Insufficient documentation

## 2012-08-31 DIAGNOSIS — K2289 Other specified disease of esophagus: Secondary | ICD-10-CM | POA: Insufficient documentation

## 2012-08-31 DIAGNOSIS — R1013 Epigastric pain: Secondary | ICD-10-CM

## 2012-09-01 ENCOUNTER — Other Ambulatory Visit: Payer: Self-pay | Admitting: *Deleted

## 2012-09-01 MED ORDER — DEXLANSOPRAZOLE 60 MG PO CPDR: DELAYED_RELEASE_CAPSULE | ORAL | Status: DC

## 2012-09-28 ENCOUNTER — Telehealth: Payer: Self-pay | Admitting: *Deleted

## 2012-09-28 NOTE — Telephone Encounter (Signed)
Patient calling to report the Dexilant BID helped his symptoms. He reports he stopped it for one week and symptoms started back. He started back on Dexilant daily and is better. Needs rx. He will call his pharmacy for refill.

## 2012-09-28 NOTE — Telephone Encounter (Signed)
Does he want a prescription for bid or qd? Whatever he wanrs is OK. DB 6 refills.

## 2012-09-28 NOTE — Telephone Encounter (Signed)
Left a message for patient to call me with update. 

## 2012-09-29 NOTE — Telephone Encounter (Signed)
Patient said daily is working for him. He has refills so he is in good shape,

## 2012-09-29 NOTE — Telephone Encounter (Signed)
Left a message for patient to call me. 

## 2013-02-25 IMAGING — RF DG UGI W/ HIGH DENSITY W/KUB
14 of 19 series · 14 of 19 positions shown · non-contrast
Comparison: None.

CLINICAL DATA: Vomiting blood.  Ulcer by endoscopy.

UPPER GI SERIES WITH KUB
TECHNIQUE: Routine upper GI series was performed with effervescent
crystals, thin and high density barium.
Fluoroscopy Time: 1.8 minutes

[Series 1: run · 1 of 1 slices shown (1 of 14)]
[im 1/1]
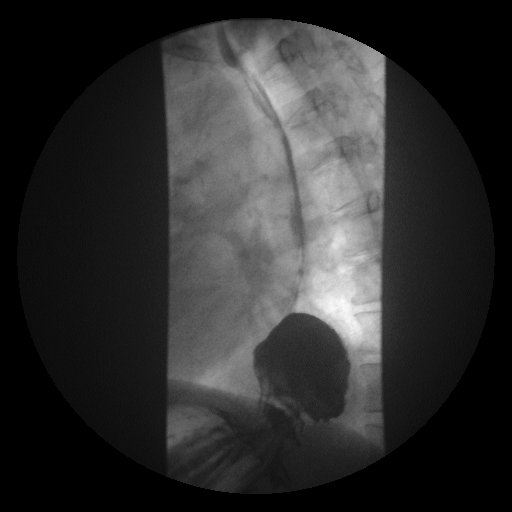

[Series 3: run · 1 of 1 slices shown (2 of 14)]
[im 1/1]
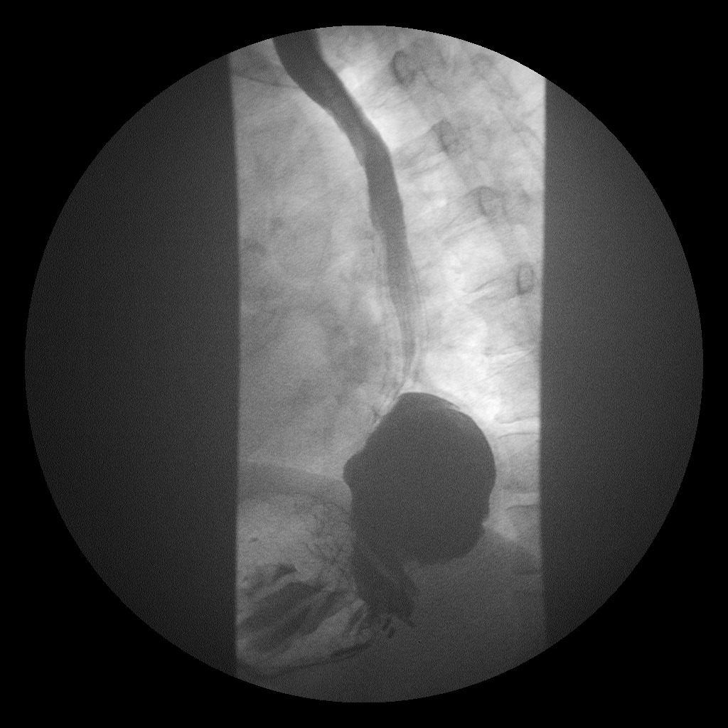

[Series 4: run · 1 of 1 slices shown (3 of 14)]
[im 1/1]
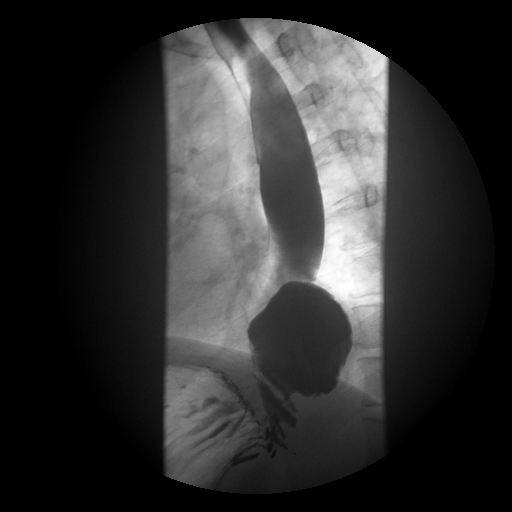

[Series 5: run · 1 of 1 slices shown (4 of 14)]
[im 1/1]
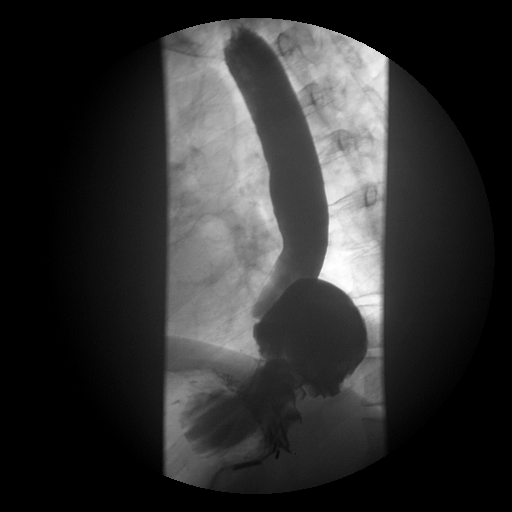

[Series 7: run · 1 of 1 slices shown (5 of 14)]
[im 1/1]
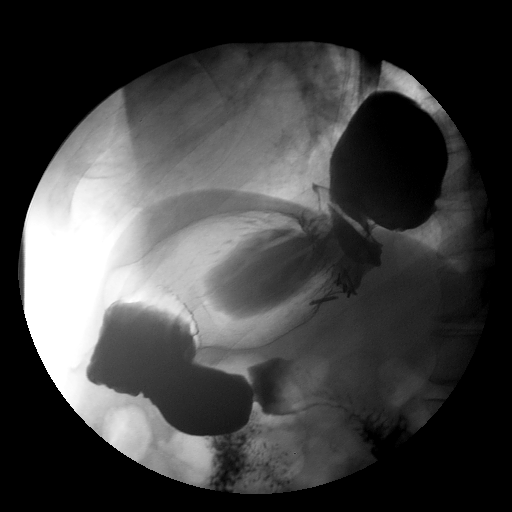

[Series 8: run · 1 of 1 slices shown (6 of 14)]
[im 1/1]
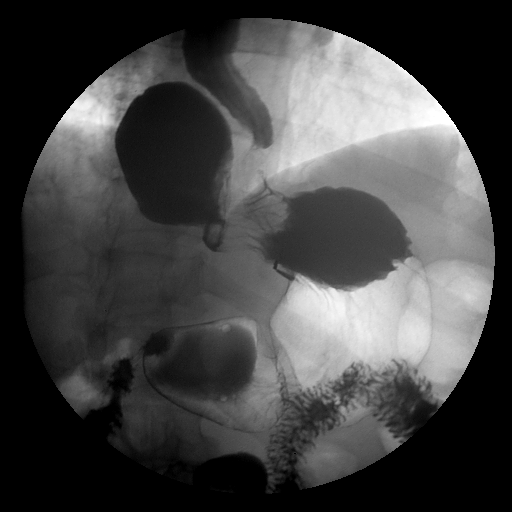

[Series 9: run · 1 of 1 slices shown (7 of 14)]
[im 1/1]
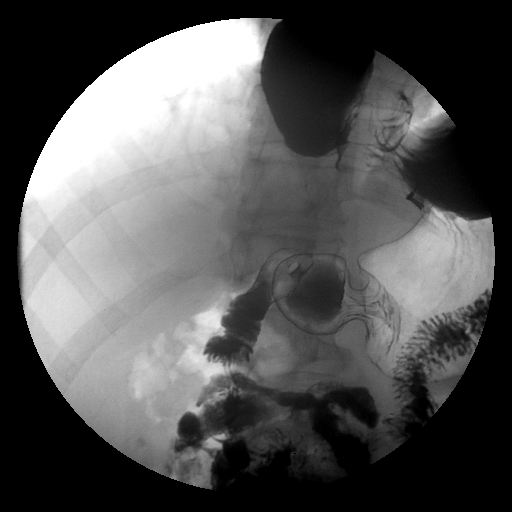

[Series 11: run · 1 of 1 slices shown (8 of 14)]
[im 1/1]
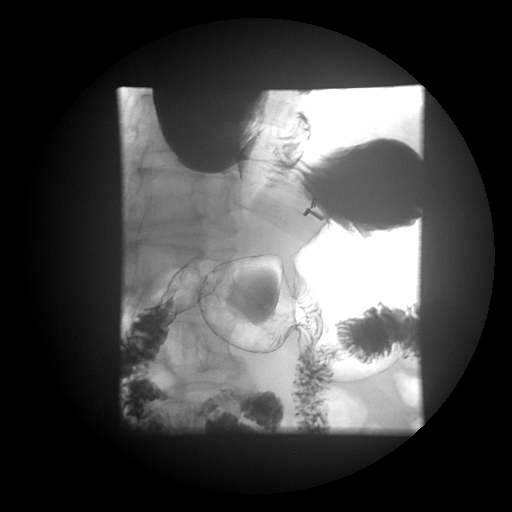

[Series 12: run · 1 of 1 slices shown (9 of 14)]
[im 1/1]
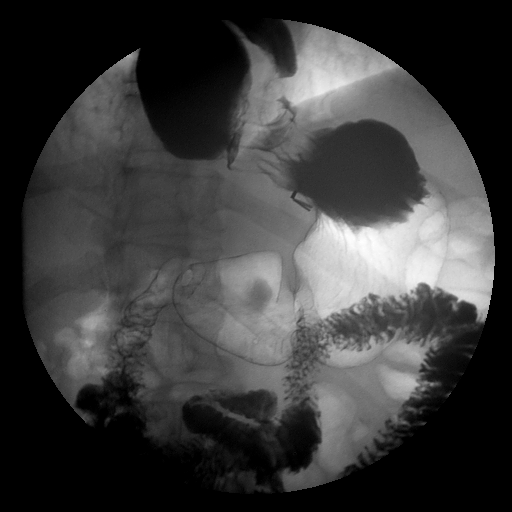

[Series 13: run · 1 of 1 slices shown (10 of 14)]
[im 1/1]
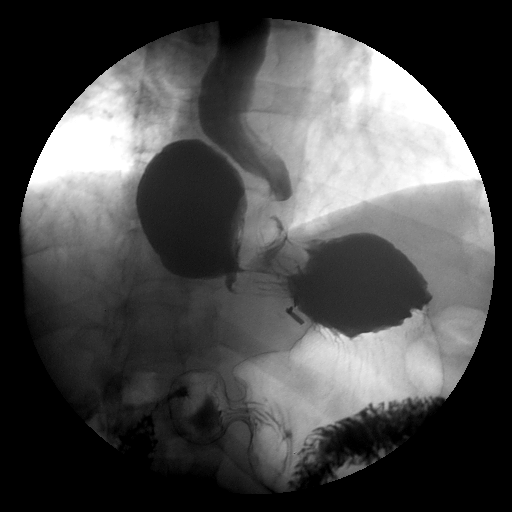

[Series 15: run · 1 of 1 slices shown (11 of 14)]
[im 1/1]
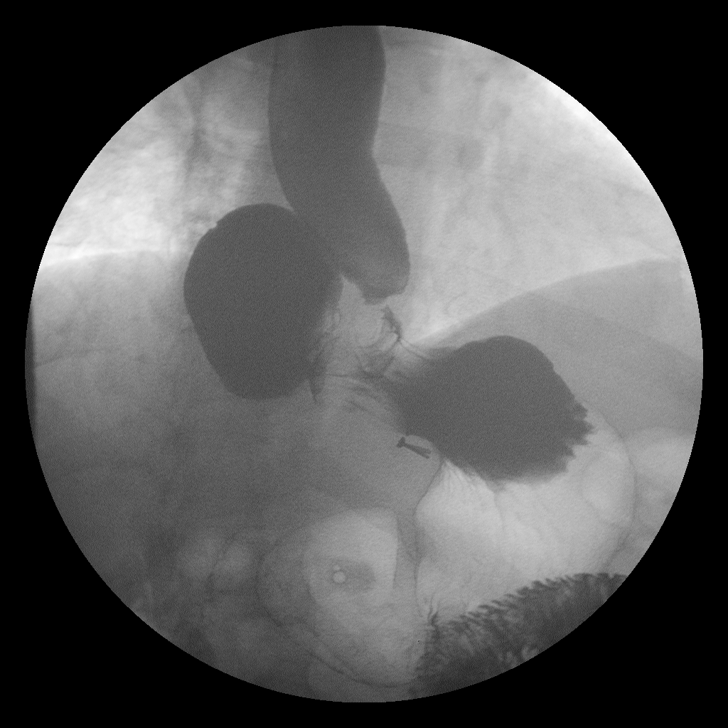

[Series 16: run · 1 of 1 slices shown (12 of 14)]
[im 1/1]
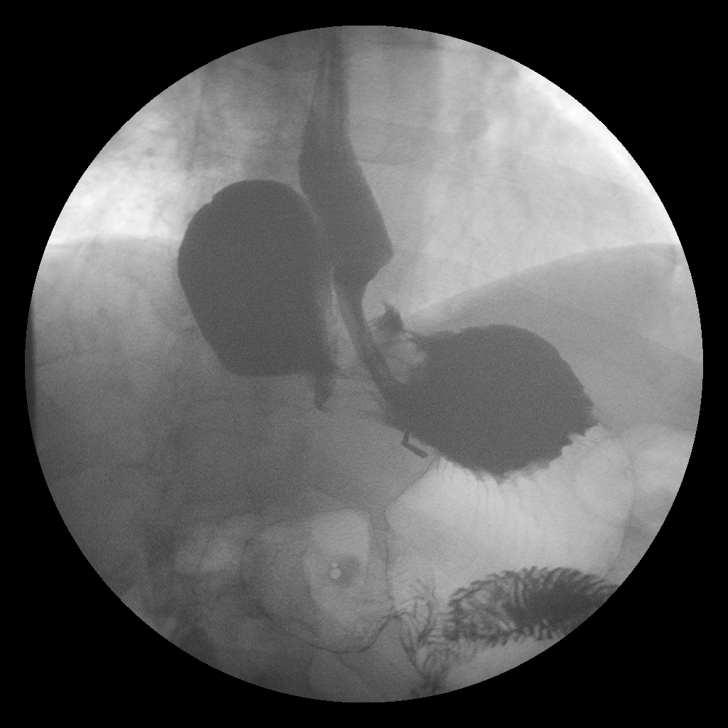

[Series 17: run · 1 of 1 slices shown (13 of 14)]
[im 1/1]
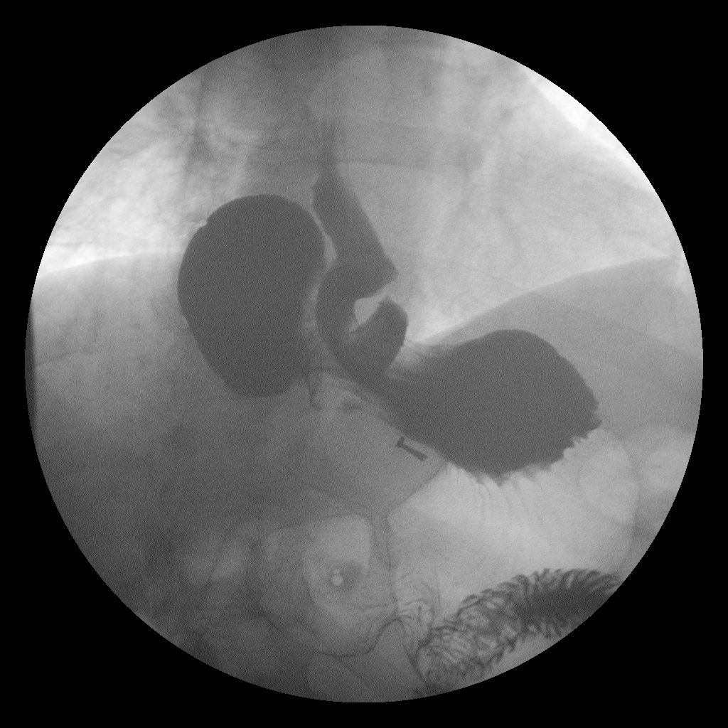

[Series 19: run · 1 of 1 slices shown (14 of 14)]
[im 1/1]
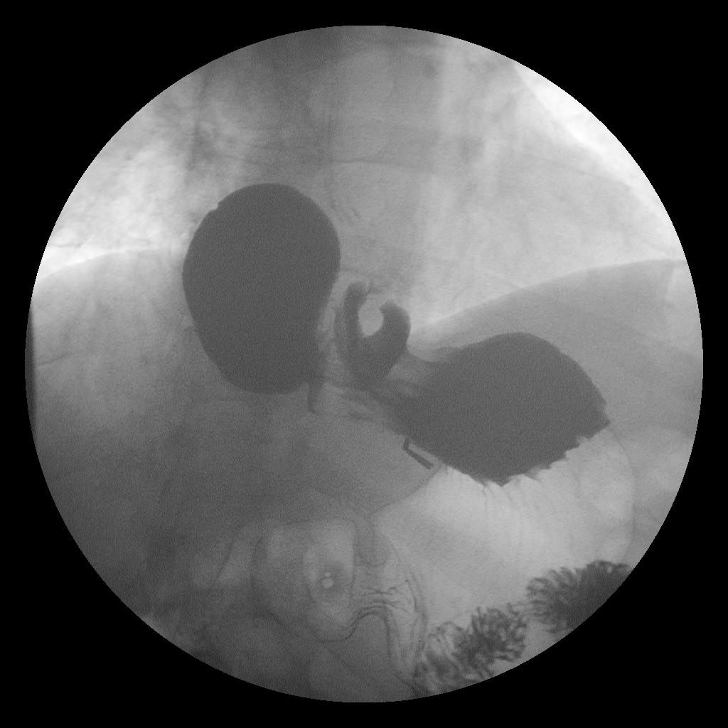

[14 of 19 positions shown; findings below may reference images not displayed]

FINDINGS: Scout image of the abdomen demonstrates a nonobstructive
bowel gas pattern.  No supine evidence of free air, organomegaly or
suspicious calcification.  Fluoroscopic evaluation of swallowing
demonstrates disruption of [DATE] primary esophageal peristaltic
waves.  No esophageal thickening, stricture or mass.

There is a large para-esophageal type hiatal hernia.  Surgical
clips are seen just below the hemidiaphragm, presumably from prior
repair.  No visible ulceration, mass or fold thickening, duodenal
bulb and duodenal sweep are unremarkable.
IMPRESSION: Nonspecific esophageal motility disorder.

Large para-esophageal hiatal hernia.

## 2015-10-15 ENCOUNTER — Emergency Department (HOSPITAL_COMMUNITY)
Admission: EM | Admit: 2015-10-15 | Discharge: 2015-10-15 | Disposition: A | Discharge status: Nursing Home (Includes ICF) | Payer: PPO | Source: Home / Self Care

## 2015-10-15 ENCOUNTER — Encounter (HOSPITAL_COMMUNITY): Payer: Self-pay | Admitting: Emergency Medicine

## 2015-10-15 ENCOUNTER — Emergency Department (HOSPITAL_COMMUNITY)
Admission: EM | Admit: 2015-10-15 | Discharge: 2015-10-15 | Disposition: A | Discharge status: Home / Self Care | Payer: PPO | Attending: Emergency Medicine | Admitting: Emergency Medicine

## 2015-10-15 ENCOUNTER — Emergency Department (HOSPITAL_COMMUNITY): Payer: PPO

## 2015-10-15 DIAGNOSIS — Z8639 Personal history of other endocrine, nutritional and metabolic disease: Secondary | ICD-10-CM | POA: Insufficient documentation

## 2015-10-15 DIAGNOSIS — Z87891 Personal history of nicotine dependence: Secondary | ICD-10-CM | POA: Insufficient documentation

## 2015-10-15 DIAGNOSIS — Z87442 Personal history of urinary calculi: Secondary | ICD-10-CM | POA: Diagnosis not present

## 2015-10-15 DIAGNOSIS — R109 Unspecified abdominal pain: Secondary | ICD-10-CM

## 2015-10-15 DIAGNOSIS — Z79899 Other long term (current) drug therapy: Secondary | ICD-10-CM | POA: Diagnosis not present

## 2015-10-15 DIAGNOSIS — K219 Gastro-esophageal reflux disease without esophagitis: Secondary | ICD-10-CM | POA: Insufficient documentation

## 2015-10-15 DIAGNOSIS — Z7951 Long term (current) use of inhaled steroids: Secondary | ICD-10-CM | POA: Diagnosis not present

## 2015-10-15 DIAGNOSIS — K5732 Diverticulitis of large intestine without perforation or abscess without bleeding: Secondary | ICD-10-CM | POA: Diagnosis not present

## 2015-10-15 DIAGNOSIS — I10 Essential (primary) hypertension: Secondary | ICD-10-CM | POA: Insufficient documentation

## 2015-10-15 LAB — URINALYSIS, ROUTINE W REFLEX MICROSCOPIC
Bilirubin Urine: NEGATIVE
Glucose, UA: NEGATIVE mg/dL
Hgb urine dipstick: NEGATIVE
Ketones, ur: NEGATIVE mg/dL
LEUKOCYTES UA: NEGATIVE
Nitrite: NEGATIVE
PROTEIN: NEGATIVE mg/dL
SPECIFIC GRAVITY, URINE: 1.015 (ref 1.005–1.030)
pH: 6.5 (ref 5.0–8.0)

## 2015-10-15 LAB — CBC
HCT: 41.5 % (ref 39.0–52.0)
HEMOGLOBIN: 14 g/dL (ref 13.0–17.0)
MCH: 31 pg (ref 26.0–34.0)
MCHC: 33.7 g/dL (ref 30.0–36.0)
MCV: 91.8 fL (ref 78.0–100.0)
PLATELETS: 183 10*3/uL (ref 150–400)
RBC: 4.52 MIL/uL (ref 4.22–5.81)
RDW: 13.3 % (ref 11.5–15.5)
WBC: 7.6 10*3/uL (ref 4.0–10.5)

## 2015-10-15 LAB — COMPREHENSIVE METABOLIC PANEL
ALBUMIN: 4.1 g/dL (ref 3.5–5.0)
ALK PHOS: 69 U/L (ref 38–126)
ALT: 18 U/L (ref 17–63)
ANION GAP: 6 (ref 5–15)
AST: 21 U/L (ref 15–41)
BUN: 10 mg/dL (ref 6–20)
CHLORIDE: 103 mmol/L (ref 101–111)
CO2: 29 mmol/L (ref 22–32)
Calcium: 9.1 mg/dL (ref 8.9–10.3)
Creatinine, Ser: 1.14 mg/dL (ref 0.61–1.24)
GFR calc non Af Amer: >60 mL/min (ref >60)
Glucose, Bld: 106 mg/dL — ABNORMAL HIGH (ref 65–99)
POTASSIUM: 3.6 mmol/L (ref 3.5–5.1)
SODIUM: 138 mmol/L (ref 135–145)
Total Bilirubin: 0.7 mg/dL (ref 0.3–1.2)
Total Protein: 6.4 g/dL — ABNORMAL LOW (ref 6.5–8.1)

## 2015-10-15 LAB — LIPASE, BLOOD: LIPASE: 18 U/L (ref 11–51)

## 2015-10-15 MED ORDER — METRONIDAZOLE 500 MG PO TABS
500.0000 mg | ORAL_TABLET | Freq: Once | ORAL | Status: AC
  Administered 2015-10-15: 500 mg via ORAL
  Filled 2015-10-15: qty 1

## 2015-10-15 MED ORDER — METRONIDAZOLE 500 MG PO TABS: 500.0000 mg | ORAL_TABLET | Freq: Three times a day (TID) | ORAL | Status: AC

## 2015-10-15 MED ORDER — CIPROFLOXACIN HCL 500 MG PO TABS
500.0000 mg | ORAL_TABLET | Freq: Once | ORAL | Status: AC
  Administered 2015-10-15: 500 mg via ORAL
  Filled 2015-10-15: qty 1

## 2015-10-15 MED ORDER — CIPROFLOXACIN HCL 500 MG PO TABS: 500.0000 mg | ORAL_TABLET | Freq: Two times a day (BID) | ORAL | Status: AC

## 2015-10-15 NOTE — ED Notes (Signed)
C/o LLQ pain onset yest associated w/nausea, fatigue, weakness and swelling  Pain increases w/pressure LBM = today; loose Denies fevers, chills A&O x4... No acute distress.

## 2015-10-15 NOTE — ED Provider Notes (Signed)
CSN: [id number]     Arrival date & time 10/15/15  [id number] History   None    No chief complaint on file.  (Consider location/radiation/quality/duration/timing/severity/associated sxs/prior Treatment) HPI History obtained from patient:   LOCATION: abdomen SEVERITY:7 DURATION:2 days CONTEXT:sudden onset QUALITY:aching MODIFYING FACTORS:OTC meds without relief ASSOCIATED SYMPTOMS:nausea TIMING:constant OCCUPATION:   Past Medical History  Diagnosis Date  . GERD (gastroesophageal reflux disease)   . Trouble swallowing   . HBP (high blood pressure)   . Hyperlipemia   . Constipation   . Diverticulosis   . Esophageal stricture   . Kidney stones   . Blood transfusion   . Ulcer    Past Surgical History  Procedure Laterality Date  . Laparoscopic nissen fundoplication  [id number], [id number]   Family History  Problem Relation Age of Onset  . Heart disease Father 69    heart attack  . Colon cancer Neg Hx    Social History  Substance Use Topics  . Smoking status: Former Smoker    Types: Cigarettes    Quit date: 11/25/1981  . Smokeless tobacco: Never Used  . Alcohol Use: No    Review of Systems ROS +'ve ABDOMINAL PAIN  Denies: HEADACHE, NAUSEA, CHEST PAIN, CONGESTION, DYSURIA, SHORTNESS OF BREATH  Allergies  Percocet and Sulfonamide derivatives  Home Medications   Prior to Admission medications   Medication Sig Start Date End Date Taking? Authorizing Provider  cetirizine (ZYRTEC) 10 MG tablet Take 10 mg by mouth daily.      Historical Provider, MD  chlorpheniramine-HYDROcodone (TUSSIONEX) 10-8 MG/5ML LQCR Take 5 mLs by mouth Every 12 hours as needed. For cough. 08/30/11   Historical Provider, MD  cholecalciferol (VITAMIN D) 1000 UNITS tablet Take 1,000 Units by mouth daily.      Historical Provider, MD  dexlansoprazole (DEXILANT) 60 MG capsule Take one tablet po BID x 2 weeks then daily 09/01/12   Lafayette Dragon, MD  dicyclomine (BENTYL) 20 MG tablet Take 1 tablet (20 mg total)  by mouth 2 (two) times daily. 08/26/12   Lafayette Dragon, MD  fluticasone (FLONASE) 50 MCG/ACT nasal spray Place 2 sprays into the nose daily as needed. For allergies. 07/22/11   Historical Provider, MD  losartan (COZAAR) 50 MG tablet Take 1 tablet by mouth At bedtime. 08/17/11   Historical Provider, MD  Multiple Vitamins-Minerals (MULTIVITAMINS THER. W/MINERALS) TABS Take 2 tablets by mouth daily.      Historical Provider, MD  Omega-3 Fatty Acids (FISH OIL BURP-LESS PO) Take 1 capsule by mouth daily.      Historical Provider, MD  ondansetron (ZOFRAN) 4 MG tablet Take 1 tablet by mouth Every 6 hours as needed. For nausea. 08/30/11   Historical Provider, MD   Meds Ordered and Administered this Visit  Medications - No data to display  There were no vitals taken for this visit. No data found.   Physical Exam  Constitutional: He is oriented to person, place, and time. He appears well-developed and well-nourished.  HENT:  Head: Normocephalic and atraumatic.  Eyes: Conjunctivae are normal.  Cardiovascular: Normal rate.   Pulmonary/Chest: Effort normal and breath sounds normal.  Abdominal: Soft. There is tenderness. There is guarding. There is no rebound.  Multiple lower quadrant tenderness no CVA tenderness on the left.  Musculoskeletal: Normal range of motion.  Neurological: He is alert and oriented to person, place, and time.  Skin: Skin is warm and dry.  Psychiatric: He has a normal mood and affect. His behavior is normal. Judgment and  thought content normal.  Nursing note and vitals reviewed.   ED Course  Procedures (including critical care time)  Labs Review Labs Reviewed - No data to display  Imaging Review No results found.   Visual Acuity Review  Right Eye Distance:   Left Eye Distance:   Bilateral Distance:    Right Eye Near:   Left Eye Near:    Bilateral Near:         MDM   1. Abdominal pain in male    Pt has LLQ pain that is somewhat typical for either  diverticulitis or renal stone. Neither diagnoses patient has had before. Discussed with patient it would be in his best interest to be properly evaluated in the ED where if more advanced investigations are needed, there would be access to those examinations. Transport to the ED will be arranged.     Konrad Felix, [place] 10/15/15 [phone]

## 2015-10-15 NOTE — ED Notes (Signed)
Pt left with all belongings and refused a wheelchair. Discharge instructions were reviewed and all questions were answered

## 2015-10-15 NOTE — ED Notes (Addendum)
C/o intermittent L sided abd pain since yesterday with mild nausea.  Denies vomiting, diarrhea, and urinary complaints.  Pt sent from [facility]. Pain worse with palpation.

## 2015-10-15 NOTE — ED Provider Notes (Signed)
CSN: [id number]     Arrival date & time 10/15/15  1957 History   First MD Initiated Contact with Patient 10/15/15 2018     Chief Complaint  Patient presents with  . Abdominal Pain     (Consider location/radiation/quality/duration/timing/severity/associated sxs/prior Treatment) Patient is a 69 y.o. male presenting with abdominal pain.  Abdominal Pain Pain location:  L flank Pain quality: stabbing   Pain radiates to:  Does not radiate Pain severity:  Moderate Onset quality:  Gradual Duration:  2 days Timing:  Intermittent Progression:  Waxing and waning Chronicity:  New Context: previous surgery (hiatal hernia repair)   Context: not alcohol use, not awakening from sleep, not eating, not sick contacts and not trauma   Relieved by:  Nothing Worsened by:  Nothing tried Ineffective treatments:  None tried Associated symptoms: diarrhea and nausea   Associated symptoms: no chest pain, no constipation, no cough, no dysuria, no fever, no hematochezia, no hematuria, no melena, no shortness of breath and no vomiting   Risk factors: no NSAID use and no recent hospitalization     Past Medical History  Diagnosis Date  . GERD (gastroesophageal reflux disease)   . Trouble swallowing   . HBP (high blood pressure)   . Hyperlipemia   . Constipation   . Diverticulosis   . Esophageal stricture   . Kidney stones   . Blood transfusion   . Ulcer    Past Surgical History  Procedure Laterality Date  . Laparoscopic nissen fundoplication  99991111, [id number]   Family History  Problem Relation Age of Onset  . Heart disease Father 60    heart attack  . Colon cancer Neg Hx    Social History  Substance Use Topics  . Smoking status: Former Smoker    Types: Cigarettes    Quit date: 11/25/1981  . Smokeless tobacco: Never Used  . Alcohol Use: Yes    Review of Systems  Constitutional: Negative for fever and appetite change.  HENT: Negative for congestion.   Eyes: Negative for visual  disturbance.  Respiratory: Negative for cough, chest tightness and shortness of breath.   Cardiovascular: Negative for chest pain and palpitations.  Gastrointestinal: Positive for nausea, abdominal pain and diarrhea. Negative for vomiting, constipation, blood in stool, melena and hematochezia.  Genitourinary: Positive for flank pain. Negative for dysuria, hematuria, decreased urine volume, scrotal swelling and penile pain.  Musculoskeletal: Negative for back pain.  Skin: Negative for rash.  Neurological: Negative for dizziness, seizures, syncope, weakness, light-headedness and headaches.  Psychiatric/Behavioral: Negative for behavioral problems.      Allergies  Percocet and Sulfonamide derivatives  Home Medications   Prior to Admission medications   Medication Sig Start Date End Date Taking? Authorizing Provider  acetaminophen (TYLENOL) 500 MG tablet Take 1,000 mg by mouth at bedtime as needed (pain).   Yes Historical Provider, MD  Cholecalciferol (VITAMIN D PO) Take 1 tablet by mouth daily.   Yes Historical Provider, MD  dexlansoprazole (DEXILANT) 60 MG capsule Take one tablet po BID x 2 weeks then daily Patient taking differently: Take 60 mg by mouth daily as needed (gastritis).  09/01/12  Yes Lafayette Dragon, MD  diphenhydrAMINE (BENADRYL) 25 MG tablet Take 25 mg by mouth at bedtime as needed for allergies.   Yes Historical Provider, MD  fluticasone (FLONASE) 50 MCG/ACT nasal spray Place 1 spray into both nostrils 2 (two) times daily as needed for allergies.  07/22/11  Yes Historical Provider, MD  loratadine (CLARITIN) 10 MG tablet Take  10 mg by mouth daily as needed for allergies.   Yes Historical Provider, MD  losartan (COZAAR) 50 MG tablet Take 50 mg by mouth at bedtime.  08/17/11  Yes Historical Provider, MD  ciprofloxacin (CIPRO) 500 MG tablet Take 1 tablet (500 mg total) by mouth 2 (two) times daily. 10/15/15 10/23/15  Nathaniel Man, MD  metroNIDAZOLE (FLAGYL) 500 MG tablet Take 1  tablet (500 mg total) by mouth 3 (three) times daily. 10/15/15 10/23/15  Nathaniel Man, MD   BP 147/66 mmHg  Pulse 56  Temp(Src) 98.9 F (37.2 C) (Oral)  Resp 14  Ht 5\' 11"  (1.803 m)  Wt 195 lb (88.451 kg)  BMI 27.21 kg/m2  SpO2 99% Physical Exam  Constitutional: He is oriented to person, place, and time. He appears well-developed and well-nourished. No distress.  HENT:  Head: Atraumatic.  Mouth/Throat: Oropharynx is clear and moist.  Eyes: Conjunctivae and EOM are normal. Pupils are equal, round, and reactive to light.  Neck: Normal range of motion.  Cardiovascular: Normal rate, regular rhythm, normal heart sounds and intact distal pulses.   Pulmonary/Chest: Effort normal and breath sounds normal. No respiratory distress. He has no wheezes.  Abdominal: Soft. He exhibits no distension and no mass. There is no hepatosplenomegaly. There is tenderness. There is no rigidity, no rebound, no guarding, no CVA tenderness, no tenderness at McBurney's point and negative Murphy's sign. No hernia.    Musculoskeletal: Normal range of motion.  Neurological: He is alert and oriented to person, place, and time.  Skin: Skin is warm.  Psychiatric: He has a normal mood and affect.  Nursing note and vitals reviewed.   ED Course  Procedures (including critical care time) Labs Review Labs Reviewed  COMPREHENSIVE METABOLIC PANEL - Abnormal; Notable for the following:    Glucose, Bld 106 (*)    Total Protein 6.4 (*)    All other components within normal limits  LIPASE, BLOOD  CBC  URINALYSIS, ROUTINE W REFLEX MICROSCOPIC (NOT AT [facility])    Imaging Review Ct Abdomen Pelvis Wo Contrast  10/15/2015  CLINICAL DATA:  Acute onset of left lower quadrant abdominal pain, with nausea, fatigue, weakness and left lower quadrant swelling. Initial encounter. EXAM: CT ABDOMEN AND PELVIS WITHOUT CONTRAST TECHNIQUE: Multidetector CT imaging of the abdomen and pelvis was performed following the standard protocol  without IV contrast. COMPARISON:  Abdominal ultrasound performed 08/20/2012 FINDINGS: The visualized lung bases are clear. Postoperative change is noted about the gastroesophageal junction. The liver and spleen are unremarkable in appearance. The gallbladder is within normal limits. The pancreas and adrenal glands are unremarkable. Scattered nonobstructing bilateral renal stones are seen, measuring up to 8 mm in size. There is no evidence of hydronephrosis. No obstructing ureteral stones are seen. Mild nonspecific perinephric stranding is noted bilaterally. The small bowel is unremarkable in appearance. The stomach is within normal limits. No acute vascular abnormalities are seen. Minimal calcification is noted along the abdominal aorta. The appendix is normal in caliber, without evidence of appendicitis. Mild soft tissue inflammation is noted at the mid to distal descending colon, with associated inflamed diverticulum and trace free fluid, compatible with acute diverticulitis. There is no evidence of perforation or abscess formation. Mild diverticulosis is noted along the sigmoid colon. The bladder is mildly distended and grossly unremarkable. 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 No inguinal lymphadenopathy is seen. No acute osseous abnormalities are identified. IMPRESSION: 1. Mild acute diverticulitis at  the mid to distal descending colon, with mild soft tissue inflammation and trace fluid. No evidence of perforation or abscess formation. 2. Mild diverticulosis noted mostly along the sigmoid colon. 3. Scattered nonobstructing bilateral renal stones measure up to 8 mm in size. No evidence of hydronephrosis. 4. 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 Would correlate with PSA. Electronically Signed   By: Garald Balding M.D.   On: 10/15/2015 22:20   I have personally  reviewed and evaluated these images and lab results as part of my medical decision-making.  EMERGENCY DEPARTMENT US RENAL EXAM  "Study: Limited Retroperitoneal Ultrasound of Kidneys"  INDICATIONS: Flank pain  Long and short axis of both kidneys were obtained.   PERFORMED BY: Myself  IMAGES ARCHIVED?: Yes  LIMITATIONS: None  VIEWS USED: Long axis and Short axis   INTERPRETATION: No Hydronephrosis   CPT Code: [phone] (limited retroperitoneal)     EKG Interpretation None      MDM   Final diagnoses:  Diverticulitis of large intestine without perforation or abscess without bleeding    Patient is a 69 year old male with past medical history significant for hypertension, who presents to the emergency department with left-sided abdominal pain associated with nausea. History of nephrolithiasis. On arrival distress, not ill appearing. Afebrile, hematotympanum is stable. Mild left-sided tenderness to palpation without signs of peritonitis, no CVA tenderness.  Differential diagnosis includes appendicitis, diverticulitis/diverticulosis, pyelonephritis, nephrolithiasis, SBO. Patient currently pain free at this time. Did not require pain medication or antiemetics.  Bedside renal ultrasound obtained, showed no signs of hydronephrosis. UA without hematuria. Basic labs unremarkable. We'll obtain a CT abdomen/pelvis to evaluate for emergent causes of abdominal pain.  CT showed mild diverticulitis. Given Cipro and Flagyl.  Patient stable for discharge home. Given a prescription for Cipro and Flagyl. Discussed strict return precautions for any worsening abdominal pain, fevers, vomiting. Discussed following up with PCP this week. Patient expressed understanding, no questions or concerns at however discharge.      Nathaniel Man, MD 10/16/15 [id number]  Elnora Morrison, MD 10/19/15 [id number]

## 2015-10-15 NOTE — Discharge Instructions (Signed)
Diverticulitis °Diverticulitis is inflammation or infection of small pouches in your colon that form when you have a condition called diverticulosis. The pouches in your colon are called diverticula. Your colon, or large intestine, is where water is absorbed and stool is formed. °Complications of diverticulitis can include: °· Bleeding. °· Severe infection. °· Severe pain. °· Perforation of your colon. °· Obstruction of your colon. °CAUSES  °Diverticulitis is caused by bacteria. °Diverticulitis happens when stool becomes trapped in diverticula. This allows bacteria to grow in the diverticula, which can lead to inflammation and infection. °RISK FACTORS °People with diverticulosis are at risk for diverticulitis. Eating a diet that does not include enough fiber from fruits and vegetables may make diverticulitis more likely to develop. °SYMPTOMS  °Symptoms of diverticulitis may include: °· Abdominal pain and tenderness. The pain is normally located on the left side of the abdomen, but may occur in other areas. °· Fever and chills. °· Bloating. °· Cramping. °· Nausea. °· Vomiting. °· Constipation. °· Diarrhea. °· Blood in your stool. °DIAGNOSIS  °Your health care provider will ask you about your medical history and do a physical exam. You may need to have tests done because many medical conditions can cause the same symptoms as diverticulitis. Tests may include: °· Blood tests. °· Urine tests. °· Imaging tests of the abdomen, including X-rays and CT scans. °When your condition is under control, your health care provider may recommend that you have a colonoscopy. A colonoscopy can show how severe your diverticula are and whether something else is causing your symptoms. °TREATMENT  °Most cases of diverticulitis are mild and can be treated at home. Treatment may include: °· Taking over-the-counter pain medicines. °· Following a clear liquid diet. °· Taking antibiotic medicines by mouth for 7-10 days. °More severe cases may  be treated at a hospital. Treatment may include: °· Not eating or drinking. °· Taking prescription pain medicine. °· Receiving antibiotic medicines through an IV tube. °· Receiving fluids and nutrition through an IV tube. °· Surgery. °HOME CARE INSTRUCTIONS  °· Follow your health care provider's instructions carefully. °· Follow a full liquid diet or other diet as directed by your health care provider. After your symptoms improve, your health care provider may tell you to change your diet. He or she may recommend you eat a high-fiber diet. Fruits and vegetables are good sources of fiber. Fiber makes it easier to pass stool. °· Take fiber supplements or probiotics as directed by your health care provider. °· Only take medicines as directed by your health care provider. °· Keep all your follow-up appointments. °SEEK MEDICAL CARE IF:  °· Your pain does not improve. °· You have a hard time eating food. °· Your bowel movements do not return to normal. °SEEK IMMEDIATE MEDICAL CARE IF:  °· Your pain becomes worse. °· Your symptoms do not get better. °· Your symptoms suddenly get worse. °· You have a fever. °· You have repeated vomiting. °· You have bloody or black, tarry stools. °MAKE SURE YOU:  °· Understand these instructions. °· Will watch your condition. °· Will get help right away if you are not doing well or get worse. °  °This information is not intended to replace advice given to you by your health care provider. Make sure you discuss any questions you have with your health care provider. °  °Document Released: 08/21/2005 Document Revised: 11/16/2013 Document Reviewed: 10/06/2013 °Elsevier Interactive Patient Education ©2016 [employer] ° °

## 2015-12-08 DIAGNOSIS — Z872 Personal history of diseases of the skin and subcutaneous tissue: Secondary | ICD-10-CM | POA: Diagnosis not present

## 2015-12-08 DIAGNOSIS — D1801 Hemangioma of skin and subcutaneous tissue: Secondary | ICD-10-CM | POA: Diagnosis not present

## 2015-12-08 DIAGNOSIS — D225 Melanocytic nevi of trunk: Secondary | ICD-10-CM | POA: Diagnosis not present

## 2015-12-08 DIAGNOSIS — L309 Dermatitis, unspecified: Secondary | ICD-10-CM | POA: Diagnosis not present

## 2015-12-08 DIAGNOSIS — L821 Other seborrheic keratosis: Secondary | ICD-10-CM | POA: Diagnosis not present

## 2015-12-08 DIAGNOSIS — L814 Other melanin hyperpigmentation: Secondary | ICD-10-CM | POA: Diagnosis not present

## 2016-01-15 DIAGNOSIS — J209 Acute bronchitis, unspecified: Secondary | ICD-10-CM | POA: Diagnosis not present

## 2016-01-22 DIAGNOSIS — E663 Overweight: Secondary | ICD-10-CM | POA: Diagnosis not present

## 2016-01-22 DIAGNOSIS — N182 Chronic kidney disease, stage 2 (mild): Secondary | ICD-10-CM | POA: Diagnosis not present

## 2016-01-22 DIAGNOSIS — E559 Vitamin D deficiency, unspecified: Secondary | ICD-10-CM | POA: Diagnosis not present

## 2016-01-22 DIAGNOSIS — I129 Hypertensive chronic kidney disease with stage 1 through stage 4 chronic kidney disease, or unspecified chronic kidney disease: Secondary | ICD-10-CM | POA: Diagnosis not present

## 2016-01-22 DIAGNOSIS — K219 Gastro-esophageal reflux disease without esophagitis: Secondary | ICD-10-CM | POA: Diagnosis not present

## 2016-01-22 DIAGNOSIS — Z23 Encounter for immunization: Secondary | ICD-10-CM | POA: Diagnosis not present

## 2016-01-22 DIAGNOSIS — Z0001 Encounter for general adult medical examination with abnormal findings: Secondary | ICD-10-CM | POA: Diagnosis not present

## 2016-05-13 ENCOUNTER — Telehealth: Payer: Self-pay | Admitting: Internal Medicine

## 2016-05-14 NOTE — Telephone Encounter (Signed)
Appointment has been scheduled for 06/27/16 with Dr. Henrene Pastor.

## 2016-05-14 NOTE — Telephone Encounter (Signed)
Left message for patient to return my call.

## 2016-05-14 NOTE — Telephone Encounter (Signed)
Okay with me 

## 2016-06-27 ENCOUNTER — Encounter: Payer: Self-pay | Admitting: Internal Medicine

## 2016-06-27 ENCOUNTER — Encounter (INDEPENDENT_AMBULATORY_CARE_PROVIDER_SITE_OTHER): Payer: Self-pay

## 2016-06-27 ENCOUNTER — Ambulatory Visit (INDEPENDENT_AMBULATORY_CARE_PROVIDER_SITE_OTHER): Payer: PPO | Admitting: Internal Medicine

## 2016-06-27 VITALS — BP 130/70 | HR 80 | Ht 71.0 in | Wt 189.0 lb

## 2016-06-27 DIAGNOSIS — G8929 Other chronic pain: Secondary | ICD-10-CM | POA: Diagnosis not present

## 2016-06-27 DIAGNOSIS — R1013 Epigastric pain: Secondary | ICD-10-CM | POA: Diagnosis not present

## 2016-06-27 DIAGNOSIS — K219 Gastro-esophageal reflux disease without esophagitis: Secondary | ICD-10-CM

## 2016-06-27 MED ORDER — PANTOPRAZOLE SODIUM 40 MG PO TBEC: 40.0000 mg | DELAYED_RELEASE_TABLET | Freq: Every day | ORAL | 11 refills | Status: DC

## 2016-06-27 NOTE — Patient Instructions (Signed)
We have sent the following medications to your pharmacy for you to pick up at your convenience:  Pantoprazole  

## 2016-06-27 NOTE — Progress Notes (Signed)
HISTORY OF PRESENT ILLNESS:  [name] [name] is a 70 y.o. male , former patient of Dr. Olevia Perches, who presents today to establish regarding management of chronic GERD and general GI issues. The patient's history is remarkable for long-standing GERD for which she underwent fundoplication. 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 This was treated endoscopically. Subsequent redo surgery October 2012. Patient had follow-up endoscopy with Dr. Olevia Perches October 2013 which was negative postop exam without ulcer. His last colonoscopy was in 2012. No polyps. Follow-up in 10 years recommended. November of last year the patient was evaluated for left-sided pain and was found to have mild acute diverticulitis. He presents now with complaints of intermittent mid abdominal pain which occurs hand full of times per year. He was told Dr. Olevia Perches that this represents gastritis. He had been using Dexilant 60 mg daily on demand. Reports that his discomfort would resolve within 8 hours of taking the medication. Occasionally it would last longer. Would take medications for a few days are up to one week. Had been using office samples and prescription. Insurance no longer covers [place]. He was looking for an alternative. Last episode was 2 months ago. He did you samples from his PCP to treat the discomfort. Patient's GI review of systems is otherwise negative.  REVIEW OF SYSTEMS:  All non-GI ROS negative except for sinus and allergy troubles  Past Medical History:  Diagnosis Date  . Blood transfusion   . Constipation   . Diverticulitis   . Diverticulosis   . Esophageal stricture   . GERD (gastroesophageal reflux disease)   . HBP (high blood pressure)   . Hyperlipemia   . Kidney stones   . Trouble swallowing   . Ulcer     Past Surgical History:  Procedure Laterality Date  . LAPAROSCOPIC  NISSEN FUNDOPLICATION  99991111, [id number]    Social History CHRISTPOHER WENGER  reports that he quit smoking about 34 years ago. His smoking use included Cigarettes. He has never used smokeless tobacco. He reports that he drinks alcohol. He reports that he does not use drugs.  family history includes Heart disease (age of onset: 76) in his father.  Allergies  Allergen Reactions  . Percocet [Oxycodone-Acetaminophen] Nausea And Vomiting  . Sulfonamide Derivatives Hives       PHYSICAL EXAMINATION: Vital signs: BP 130/70   Pulse 80   Ht 5\' 11"  (1.803 m)   Wt 189 lb (85.7 kg)   BMI 26.36 kg/m   Constitutional: generally well-appearing, no acute distress Psychiatric: alert and oriented x3, cooperative Eyes: extraocular movements intact, anicteric, conjunctiva pink Mouth: oral pharynx moist, no lesions Neck: supple no lymphadenopathy Cardiovascular: heart regular rate and rhythm, no murmur Lungs: clear to auscultation bilaterally Abdomen: soft, nontender, nondistended, no obvious ascites, no peritoneal signs, normal bowel sounds, no organomegaly. Surgical incisions well-healed Rectal:Deferred ommitted Extremities: no clubbing cyanosis or lower extremity edema bilaterally Skin: no lesions on visible extremities Neuro: No focal deficits. Cranial nerves intact  ASSESSMENT:  #1. Recurrent mid abdominal pain treated with an promptly responding to on demand PPI. Looking for alternative to Dexilant due to insurance formulary preference #2. GERD. Status post fundoplication #3. 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 Subsequent redo fundoplication with negative postoperative EGD October 2013 #4. Colon cancer screening. Up-to-date   PLAN:  #1. Reflux precautions #2. Prescribed pantoprazole 40 mg daily as PPI alternative #  3. Colon cancer screening around February 2022. Interval follow-up as needed

## 2016-07-08 ENCOUNTER — Ambulatory Visit: Payer: PPO | Admitting: Internal Medicine

## 2016-10-18 ENCOUNTER — Encounter (HOSPITAL_COMMUNITY): Payer: Self-pay

## 2016-10-18 ENCOUNTER — Ambulatory Visit (HOSPITAL_COMMUNITY)
Admission: EM | Admit: 2016-10-18 | Discharge: 2016-10-18 | Disposition: A | Discharge status: Home / Self Care | Payer: PPO | Attending: Emergency Medicine | Admitting: Emergency Medicine

## 2016-10-18 DIAGNOSIS — J019 Acute sinusitis, unspecified: Secondary | ICD-10-CM | POA: Diagnosis not present

## 2016-10-18 DIAGNOSIS — J00 Acute nasopharyngitis [common cold]: Secondary | ICD-10-CM

## 2016-10-18 DIAGNOSIS — J9801 Acute bronchospasm: Secondary | ICD-10-CM | POA: Diagnosis not present

## 2016-10-18 MED ORDER — ALBUTEROL SULFATE HFA 108 (90 BASE) MCG/ACT IN AERS: 2.0000 | INHALATION_SPRAY | RESPIRATORY_TRACT | 0 refills | Status: DC | PRN

## 2016-10-18 MED ORDER — AMOXICILLIN 500 MG PO CAPS: 500.0000 mg | ORAL_CAPSULE | Freq: Three times a day (TID) | ORAL | 0 refills | Status: DC

## 2016-10-18 NOTE — ED Triage Notes (Signed)
Pt said he thinks he has a sinus infection. Said his symptoms started on Tuesday and is getting worse. Having green mucus, facial pain, cough, small amount of body aches but no headaches. Has taken mucinex without relief but did move the mucus.

## 2016-10-18 NOTE — ED Provider Notes (Signed)
CSN: [id number]     Arrival date & time 10/18/16  1003 History   First MD Initiated Contact with Patient 10/18/16 1108     Chief Complaint  Patient presents with  . Facial Pain   (Consider location/radiation/quality/duration/timing/severity/associated sxs/prior Treatment) 70 year old male presents to the urgent care complaining of upper rest were congestion, dark green mucus, PND, coughing, feeling weak decreased appetite, bilateral ear discomfort and questionable subjective fevers. Denies sore throat. All he medication his Mucinex.      Past Medical History:  Diagnosis Date  . Blood transfusion   . Constipation   . Diverticulitis   . Diverticulosis   . Esophageal stricture   . GERD (gastroesophageal reflux disease)   . HBP (high blood pressure)   . Hyperlipemia   . Kidney stones   . Trouble swallowing   . Ulcer [facility])    Past Surgical History:  Procedure Laterality Date  . LAPAROSCOPIC NISSEN FUNDOPLICATION  99991111, [id number]   Family History  Problem Relation Age of Onset  . Heart disease Father 51    heart attack  . Colon cancer Neg Hx    Social History  Substance Use Topics  . Smoking status: Former Smoker    Types: Cigarettes    Quit date: 11/25/1981  . Smokeless tobacco: Never Used  . Alcohol use Yes    Review of Systems  Constitutional: Positive for activity change and fever. Negative for diaphoresis and fatigue.  HENT: Positive for congestion and postnasal drip. Negative for ear pain, facial swelling, rhinorrhea, sore throat and trouble swallowing.   Eyes: Negative for pain, discharge and redness.  Respiratory: Positive for cough. Negative for chest tightness and shortness of breath.   Cardiovascular: Negative.   Gastrointestinal: Negative.   Musculoskeletal: Negative.  Negative for neck pain and neck stiffness.  Skin: Negative.   Neurological: Negative.   All other systems reviewed and are negative.   Allergies  Percocet [oxycodone-acetaminophen] and  Sulfonamide derivatives  Home Medications   Prior to Admission medications   Medication Sig Start Date End Date Taking? Authorizing Provider  acetaminophen (TYLENOL) 500 MG tablet Take 1,000 mg by mouth at bedtime as needed (pain).   Yes Historical Provider, MD  Cholecalciferol (VITAMIN D PO) Take 1 tablet by mouth daily.   Yes Historical Provider, MD  dexlansoprazole (DEXILANT) 60 MG capsule Take one tablet po BID x 2 weeks then daily Patient taking differently: Take 60 mg by mouth daily as needed (gastritis).  09/01/12  Yes Lafayette Dragon, MD  diphenhydrAMINE (BENADRYL) 25 MG tablet Take 25 mg by mouth at bedtime as needed for allergies.   Yes Historical Provider, MD  fluticasone (FLONASE) 50 MCG/ACT nasal spray Place 1 spray into both nostrils 2 (two) times daily as needed for allergies.  07/22/11  Yes Historical Provider, MD  loratadine (CLARITIN) 10 MG tablet Take 10 mg by mouth daily as needed for allergies.   Yes Historical Provider, MD  losartan (COZAAR) 50 MG tablet Take 50 mg by mouth at bedtime.  08/17/11  Yes Historical Provider, MD  pantoprazole (PROTONIX) 40 MG tablet Take 1 tablet (40 mg total) by mouth daily. 06/27/16  Yes Irene Shipper, MD  albuterol (PROVENTIL HFA;VENTOLIN HFA) 108 (90 Base) MCG/ACT inhaler Inhale 2 puffs into the lungs every 4 (four) hours as needed for wheezing or shortness of breath. 10/18/16   Janne Napoleon, NP  amoxicillin (AMOXIL) 500 MG capsule Take 1 capsule (500 mg total) by mouth 3 (three) times daily. 10/18/16  Janne Napoleon, NP   Meds Ordered and Administered this Visit  Medications - No data to display  BP 158/86 (BP Location: Left Arm)   Pulse 92   Temp 99.5 F (37.5 C) (Oral)   Resp 16   Ht 5\' 11"  (1.803 m)   Wt 192 lb (87.1 kg)   SpO2 97%   BMI 26.78 kg/m  No data found.   Physical Exam  Constitutional: He is oriented to person, place, and time. He appears well-developed and well-nourished. No distress.  HENT:  Head: Normocephalic.  Right  Ear: External ear normal.  Left Ear: External ear normal.  Mouth/Throat: No oropharyngeal exudate.  Oropharynx with curtains of white PND. Minor erythema and cobblestoning. No exudates.  Bilateral TMs are normal.  Eyes: EOM are normal. Pupils are equal, round, and reactive to light.  Neck: Normal range of motion. Neck supple.  Cardiovascular: Normal rate, regular rhythm and normal heart sounds.   Pulmonary/Chest: Effort normal. No respiratory distress.  Few bilateral wheezes with forced expiration. Otherwise good air movement. No rhonchi.  Musculoskeletal: Normal range of motion. He exhibits no edema.  Lymphadenopathy:    He has no cervical adenopathy.  Neurological: He is alert and oriented to person, place, and time.  Skin: Skin is warm and dry.  Psychiatric: He has a normal mood and affect.  Nursing note and vitals reviewed.   Urgent Care Course   Clinical Course     Procedures (including critical care time)  Labs Review Labs Reviewed - No data to display  Imaging Review No results found.   Visual Acuity Review  Right Eye Distance:   Left Eye Distance:   Bilateral Distance:    Right Eye Near:   Left Eye Near:    Bilateral Near:         MDM   1. Acute rhinosinusitis   2. Acute nasopharyngitis   3. Bronchospasm    Sudafed PE 10 mg every 4 to 6 hours as needed for congestion Allegra or Zyrtec daily as needed for drainage and runny nose. For stronger antihistamine may take Chlor-Trimeton 2 to 4 mg every 4 to 6 hours, may cause drowsiness. Saline nasal spray used frequently. Ibuprofen 600 mg every 6 hours as needed for pain, discomfort or fever. Drink plenty of fluids and stay well-hydrated. Flonase or Rhinocort nasal spray daily Meds ordered this encounter  Medications  . albuterol (PROVENTIL HFA;VENTOLIN HFA) 108 (90 Base) MCG/ACT inhaler    Sig: Inhale 2 puffs into the lungs every 4 (four) hours as needed for wheezing or shortness of breath.     Dispense:  1 Inhaler    Refill:  0    Order Specific Question:   Supervising Provider    Answer:   Melony Overly [id number]  . amoxicillin (AMOXIL) 500 MG capsule    Sig: Take 1 capsule (500 mg total) by mouth 3 (three) times daily.    Dispense:  21 capsule    Refill:  0    Order Specific Question:   Supervising Provider    Answer:   Carmela Hurt       Janne Napoleon, NP 10/18/16 1123

## 2016-10-18 NOTE — Discharge Instructions (Signed)
Sudafed PE 10 mg every 4 to 6 hours as needed for congestion °Allegra or Zyrtec daily as needed for drainage and runny nose. °For stronger antihistamine may take Chlor-Trimeton 2 to 4 mg every 4 to 6 hours, may cause drowsiness. °Saline nasal spray used frequently. °Ibuprofen 600 mg every 6 hours as needed for pain, discomfort or fever. °Drink plenty of fluids and stay well-hydrated. °Flonase or Rhinocort nasal spray daily °

## 2016-10-21 DIAGNOSIS — R05 Cough: Secondary | ICD-10-CM | POA: Diagnosis not present

## 2016-10-22 ENCOUNTER — Other Ambulatory Visit: Payer: Self-pay | Admitting: Internal Medicine

## 2016-10-22 DIAGNOSIS — IMO0001 Reserved for inherently not codable concepts without codable children: Secondary | ICD-10-CM

## 2016-10-22 DIAGNOSIS — R911 Solitary pulmonary nodule: Secondary | ICD-10-CM

## 2016-10-23 ENCOUNTER — Other Ambulatory Visit: Payer: Self-pay | Admitting: Internal Medicine

## 2016-10-23 DIAGNOSIS — R911 Solitary pulmonary nodule: Secondary | ICD-10-CM

## 2016-10-23 DIAGNOSIS — IMO0001 Reserved for inherently not codable concepts without codable children: Secondary | ICD-10-CM

## 2016-10-25 ENCOUNTER — Other Ambulatory Visit: Payer: PPO

## 2016-10-28 ENCOUNTER — Ambulatory Visit
Admission: RE | Admit: 2016-10-28 | Discharge: 2016-10-28 | Disposition: A | Discharge status: Home / Self Care | Payer: PPO | Source: Ambulatory Visit | Attending: Internal Medicine | Admitting: Internal Medicine

## 2016-10-28 DIAGNOSIS — IMO0001 Reserved for inherently not codable concepts without codable children: Secondary | ICD-10-CM

## 2016-10-28 DIAGNOSIS — R918 Other nonspecific abnormal finding of lung field: Secondary | ICD-10-CM | POA: Diagnosis not present

## 2016-10-28 DIAGNOSIS — R911 Solitary pulmonary nodule: Secondary | ICD-10-CM

## 2016-10-28 MED ORDER — IOPAMIDOL (ISOVUE-300) INJECTION 61%
75.0000 mL | Freq: Once | INTRAVENOUS | Status: AC | PRN
  Administered 2016-10-28: 75 mL via INTRAVENOUS

## 2016-10-30 DIAGNOSIS — N182 Chronic kidney disease, stage 2 (mild): Secondary | ICD-10-CM | POA: Diagnosis not present

## 2016-10-30 DIAGNOSIS — R911 Solitary pulmonary nodule: Secondary | ICD-10-CM | POA: Diagnosis not present

## 2016-10-30 DIAGNOSIS — J209 Acute bronchitis, unspecified: Secondary | ICD-10-CM | POA: Diagnosis not present

## 2016-10-30 DIAGNOSIS — I129 Hypertensive chronic kidney disease with stage 1 through stage 4 chronic kidney disease, or unspecified chronic kidney disease: Secondary | ICD-10-CM | POA: Diagnosis not present

## 2016-10-30 DIAGNOSIS — E663 Overweight: Secondary | ICD-10-CM | POA: Diagnosis not present

## 2016-10-30 DIAGNOSIS — K219 Gastro-esophageal reflux disease without esophagitis: Secondary | ICD-10-CM | POA: Diagnosis not present

## 2016-10-31 DIAGNOSIS — R05 Cough: Secondary | ICD-10-CM | POA: Diagnosis not present

## 2016-10-31 DIAGNOSIS — R911 Solitary pulmonary nodule: Secondary | ICD-10-CM | POA: Diagnosis not present

## 2016-12-24 DIAGNOSIS — R05 Cough: Secondary | ICD-10-CM | POA: Diagnosis not present

## 2016-12-24 DIAGNOSIS — R0982 Postnasal drip: Secondary | ICD-10-CM | POA: Diagnosis not present

## 2016-12-27 DIAGNOSIS — H524 Presbyopia: Secondary | ICD-10-CM | POA: Diagnosis not present

## 2016-12-27 DIAGNOSIS — H25813 Combined forms of age-related cataract, bilateral: Secondary | ICD-10-CM | POA: Diagnosis not present

## 2016-12-27 DIAGNOSIS — H52223 Regular astigmatism, bilateral: Secondary | ICD-10-CM | POA: Diagnosis not present

## 2016-12-27 DIAGNOSIS — H43393 Other vitreous opacities, bilateral: Secondary | ICD-10-CM | POA: Diagnosis not present

## 2016-12-27 DIAGNOSIS — H43813 Vitreous degeneration, bilateral: Secondary | ICD-10-CM | POA: Diagnosis not present

## 2016-12-27 DIAGNOSIS — H5213 Myopia, bilateral: Secondary | ICD-10-CM | POA: Diagnosis not present

## 2017-01-16 DIAGNOSIS — Z0000 Encounter for general adult medical examination without abnormal findings: Secondary | ICD-10-CM | POA: Diagnosis not present

## 2017-01-16 DIAGNOSIS — I129 Hypertensive chronic kidney disease with stage 1 through stage 4 chronic kidney disease, or unspecified chronic kidney disease: Secondary | ICD-10-CM | POA: Diagnosis not present

## 2017-01-16 DIAGNOSIS — Z125 Encounter for screening for malignant neoplasm of prostate: Secondary | ICD-10-CM | POA: Diagnosis not present

## 2017-01-22 ENCOUNTER — Other Ambulatory Visit: Payer: Self-pay | Admitting: Internal Medicine

## 2017-01-22 DIAGNOSIS — R911 Solitary pulmonary nodule: Secondary | ICD-10-CM

## 2017-01-23 DIAGNOSIS — R5383 Other fatigue: Secondary | ICD-10-CM | POA: Diagnosis not present

## 2017-01-23 DIAGNOSIS — Z0000 Encounter for general adult medical examination without abnormal findings: Secondary | ICD-10-CM | POA: Diagnosis not present

## 2017-01-23 DIAGNOSIS — R740 Nonspecific elevation of levels of transaminase and lactic acid dehydrogenase [LDH]: Secondary | ICD-10-CM | POA: Diagnosis not present

## 2017-01-23 DIAGNOSIS — K219 Gastro-esophageal reflux disease without esophagitis: Secondary | ICD-10-CM | POA: Diagnosis not present

## 2017-01-23 DIAGNOSIS — I129 Hypertensive chronic kidney disease with stage 1 through stage 4 chronic kidney disease, or unspecified chronic kidney disease: Secondary | ICD-10-CM | POA: Diagnosis not present

## 2017-01-23 DIAGNOSIS — N182 Chronic kidney disease, stage 2 (mild): Secondary | ICD-10-CM | POA: Diagnosis not present

## 2017-02-03 ENCOUNTER — Ambulatory Visit
Admission: RE | Admit: 2017-02-03 | Discharge: 2017-02-03 | Disposition: A | Discharge status: Home / Self Care | Payer: PPO | Source: Ambulatory Visit | Attending: Internal Medicine | Admitting: Internal Medicine

## 2017-02-03 DIAGNOSIS — R918 Other nonspecific abnormal finding of lung field: Secondary | ICD-10-CM | POA: Diagnosis not present

## 2017-02-03 DIAGNOSIS — R911 Solitary pulmonary nodule: Secondary | ICD-10-CM

## 2017-02-03 MED ORDER — IOPAMIDOL (ISOVUE-300) INJECTION 61%
75.0000 mL | Freq: Once | INTRAVENOUS | Status: AC | PRN
  Administered 2017-02-03: 75 mL via INTRAVENOUS

## 2017-02-24 ENCOUNTER — Encounter: Payer: Self-pay | Admitting: Allergy and Immunology

## 2017-02-24 ENCOUNTER — Ambulatory Visit (INDEPENDENT_AMBULATORY_CARE_PROVIDER_SITE_OTHER): Payer: PPO | Admitting: Allergy and Immunology

## 2017-02-24 VITALS — BP 140/66 | HR 88 | Temp 98.3°F | Resp 16 | Ht 69.5 in | Wt 189.8 lb

## 2017-02-24 DIAGNOSIS — R05 Cough: Secondary | ICD-10-CM | POA: Diagnosis not present

## 2017-02-24 DIAGNOSIS — J3089 Other allergic rhinitis: Secondary | ICD-10-CM

## 2017-02-24 DIAGNOSIS — R062 Wheezing: Secondary | ICD-10-CM | POA: Diagnosis not present

## 2017-02-24 DIAGNOSIS — J4530 Mild persistent asthma, uncomplicated: Secondary | ICD-10-CM | POA: Insufficient documentation

## 2017-02-24 DIAGNOSIS — R059 Cough, unspecified: Secondary | ICD-10-CM

## 2017-02-24 DIAGNOSIS — R052 Subacute cough: Secondary | ICD-10-CM | POA: Insufficient documentation

## 2017-02-24 MED ORDER — AZELASTINE HCL 0.1 % NA SOLN: NASAL | 5 refills | Status: DC

## 2017-02-24 MED ORDER — ALBUTEROL SULFATE HFA 108 (90 BASE) MCG/ACT IN AERS: 2.0000 | INHALATION_SPRAY | RESPIRATORY_TRACT | 1 refills | Status: DC | PRN

## 2017-02-24 NOTE — Assessment & Plan Note (Signed)
Spirometry today reveals partial reversibility while asymptomatic.  The coughing/wheezing is most likely secondary to bronchial hyperresponsiveness along with upper airway cough syndrome.  Treatment plan as outlined above for allergic rhinitis.  A prescription has been provided for albuterol HFA, 1-2 inhalations every 4-6 hours as needed.  Subjective and objective measures of pulmonary function will be followed and the treatment plan will be adjusted accordingly.

## 2017-02-24 NOTE — Progress Notes (Signed)
New Patient Note  RE: [name] [name] MRN: [id number] DOB: 04-Aug-1946 Date of Office Visit: 02/24/2017  Referring provider: Thressa Sheller, MD Primary care provider: Thressa Sheller, MD  Chief Complaint: Nasal Congestion and Cough   History of present illness: [name] [name] is a 71 y.o. male seen today in consultation requested by Thressa Sheller, MD.  He complains of nasal congestion, rhinorrhea, sneezing, postnasal drainage, and occasional sinus pressure.  He states that he has had these symptoms "forever."  These symptoms occur year around with possible increase in frequency and severity in the springtime.  This past December he developed a viral syndrome with a cough and the cough lingered well after the other symptoms had resolved.  He is no longer experiencing this cough, however notes that typically when he has a sinus infection he experiences coughing and occasional wheezing.  On average he has 3 sinus infections per year.  He has had [employer] fundoplication for hiatal hernia and states that he no longer experiences symptoms related to gastritis or acid reflux.   Assessment and plan: Perennial allergic rhinitis  Aeroallergen avoidance measures have been discussed and provided in written form.  A prescription has been provided for azelastine nasal spray, 1-2 sprays per nostril 2 times daily as needed. Proper nasal spray technique has been discussed and demonstrated.   Nasal saline lavage (NeilMed) has been recommended as needed and prior to medicated nasal sprays along with instructions for proper administration.  For thick post nasal drainage, add guaifenesin [phone] mg (Mucinex)  twice daily as needed with adequate hydration as discussed.  If allergen avoidance measures and medications fail to adequately relieve symptoms, aeroallergen immunotherapy will be considered.  Coughing/wheezing Spirometry today reveals partial reversibility while asymptomatic.  The coughing/wheezing is  most likely secondary to bronchial hyperresponsiveness along with upper airway cough syndrome.  Treatment plan as outlined above for allergic rhinitis.  A prescription has been provided for albuterol HFA, 1-2 inhalations every 4-6 hours as needed.  Subjective and objective measures of pulmonary function will be followed and the treatment plan will be adjusted accordingly.   Meds ordered this encounter  Medications  . azelastine (ASTELIN) 0.1 % nasal spray    Sig: 1-2 sprays per nostril 2 times a day as needed.    Dispense:  30 mL    Refill:  5  . albuterol (PROVENTIL HFA;VENTOLIN HFA) 108 (90 Base) MCG/ACT inhaler    Sig: Inhale 2 puffs into the lungs every 4 (four) hours as needed for wheezing or shortness of breath.    Dispense:  1 Inhaler    Refill:  1    Diagnostics: Spirometry: FVC is 3.31 L and FEV1 is 2.76 L (83% predicted) with 130 mL postbronchodilator improvement.This study was performed while the patient was asymptomatic.  Please see scanned spirometry results for details. Environmental skin testing: Positive to molds, cockroach antigen, and dust mite antigen.    Physical examination: Blood pressure 140/66, pulse 88, temperature 98.3 F (36.8 C), temperature source Oral, resp. rate 16, height 5' 9.5" (1.765 m), weight 189 lb 12.8 oz (86.1 kg), SpO2 97 %.  General: Alert, interactive, in no acute distress. HEENT: TMs pearly gray, turbinates moderately edematous without discharge, post-pharynx mildly erythematous. Neck: Supple without lymphadenopathy. Lungs: Clear to auscultation without wheezing, rhonchi or rales. CV: Normal S1, S2 without murmurs. Abdomen: Nondistended, nontender. Skin: Warm and dry, without lesions or rashes. Extremities:  No clubbing, cyanosis or edema. Neuro:   Grossly intact.  Review of systems:  Review  of systems negative except as noted in HPI / PMHx or noted below: Review of Systems  Constitutional: Negative.   HENT: Negative.   Eyes:  Negative.   Respiratory: Negative.   Cardiovascular: Negative.   Gastrointestinal: Negative.   Genitourinary: Negative.   Musculoskeletal: Negative.   Skin: Negative.   Neurological: Negative.   Endo/Heme/Allergies: Negative.   Psychiatric/Behavioral: Negative.     Past medical history:  Past Medical History:  Diagnosis Date  . Asthma   . Blood transfusion   . Constipation   . Diverticulitis   . Diverticulosis   . Eczema   . Esophageal stricture   . GERD (gastroesophageal reflux disease)   . HBP (high blood pressure)   . Hyperlipemia   . Kidney stones   . Trouble swallowing   . Ulcer [facility])     Past surgical history:  Past Surgical History:  Procedure Laterality Date  . HIATAL HERNIA REPAIR  1992&2012  . LAPAROSCOPIC NISSEN FUNDOPLICATION  4401, 02/72/53    Family history: Family History  Problem Relation Age of Onset  . Heart disease Father 74    heart attack  . Colon cancer Neg Hx   . Allergic rhinitis Neg Hx   . Angioedema Neg Hx   . Asthma Neg Hx   . Eczema Neg Hx   . Immunodeficiency Neg Hx   . Urticaria Neg Hx     Social history: Social History   Social History  . Marital status: Married    Spouse name: N/A  . Number of children: 2  . Years of education: N/A   Occupational History  . International aid/development worker [employer]   Social History Main Topics  . Smoking status: Former Smoker    Types: Cigarettes    Quit date: 11/25/1981  . Smokeless tobacco: Never Used  . Alcohol use Yes  . Drug use: No  . Sexual activity: No   Other Topics Concern  . Not on file   Social History Narrative   2 daily caffeine drinks   Environmental History: The patient lives in a 69 year old house with carpeting the bedroom and central air/heat.  He is a nonsmoker without pets.  There is no known mold/water damage in the home.  Allergies as of 02/24/2017      Reactions   Percocet [oxycodone-acetaminophen] Nausea And Vomiting   Sulfonamide Derivatives Hives        Medication List       Accurate as of 02/24/17  6:28 PM. Always use your most recent med list.          acetaminophen 500 MG tablet Commonly known as:  TYLENOL Take 1,000 mg by mouth at bedtime as needed (pain).   albuterol 108 (90 Base) MCG/ACT inhaler Commonly known as:  PROVENTIL HFA;VENTOLIN HFA Inhale 2 puffs into the lungs every 4 (four) hours as needed for wheezing or shortness of breath.   azelastine 0.1 % nasal spray Commonly known as:  ASTELIN 1-2 sprays per nostril 2 times a day as needed.   cetirizine 10 MG tablet Commonly known as:  ZYRTEC Take 10 mg by mouth daily.   dexlansoprazole 60 MG capsule Commonly known as:  DEXILANT Take one tablet po BID x 2 weeks then daily   diphenhydrAMINE 25 MG tablet Commonly known as:  BENADRYL Take 25 mg by mouth at bedtime as needed for allergies.   fluticasone 50 MCG/ACT nasal spray Commonly known as:  FLONASE Place 1 spray into both nostrils 2 (two) times daily as  needed for allergies.   loratadine 10 MG tablet Commonly known as:  CLARITIN Take 10 mg by mouth daily as needed for allergies.   losartan 50 MG tablet Commonly known as:  COZAAR Take 50 mg by mouth at bedtime.   pantoprazole 40 MG tablet Commonly known as:  PROTONIX Take 1 tablet (40 mg total) by mouth daily.   VITAMIN D PO Take 1 tablet by mouth daily.       Known medication allergies: Allergies  Allergen Reactions  . Percocet [Oxycodone-Acetaminophen] Nausea And Vomiting  . Sulfonamide Derivatives Hives    I appreciate the opportunity to take part in [name] [name]'s care. Please do not hesitate to contact me with questions.  Sincerely,   R. Edgar Frisk, MD

## 2017-02-24 NOTE — Assessment & Plan Note (Signed)
   Aeroallergen avoidance measures have been discussed and provided in written form.  A prescription has been provided for azelastine nasal spray, 1-2 sprays per nostril 2 times daily as needed. Proper nasal spray technique has been discussed and demonstrated.   Nasal saline lavage (NeilMed) has been recommended as needed and prior to medicated nasal sprays along with instructions for proper administration.  For thick post nasal drainage, add guaifenesin 600-1200 mg (Mucinex)  twice daily as needed with adequate hydration as discussed.  If allergen avoidance measures and medications fail to adequately relieve symptoms, aeroallergen immunotherapy will be considered. 

## 2017-02-24 NOTE — Patient Instructions (Addendum)
Perennial allergic rhinitis  Aeroallergen avoidance measures have been discussed and provided in written form.  A prescription has been provided for azelastine nasal spray, 1-2 sprays per nostril 2 times daily as needed. Proper nasal spray technique has been discussed and demonstrated.   Nasal saline lavage (NeilMed) has been recommended as needed and prior to medicated nasal sprays along with instructions for proper administration.  For thick post nasal drainage, add guaifenesin [phone] mg (Mucinex)  twice daily as needed with adequate hydration as discussed.  If allergen avoidance measures and medications fail to adequately relieve symptoms, aeroallergen immunotherapy will be considered.  Coughing/wheezing Spirometry today reveals partial reversibility while asymptomatic.  The coughing/wheezing is most likely secondary to bronchial hyperresponsiveness along with upper airway cough syndrome.  Treatment plan as outlined above for allergic rhinitis.  A prescription has been provided for albuterol HFA, 1-2 inhalations every 4-6 hours as needed.  Subjective and objective measures of pulmonary function will be followed and the treatment plan will be adjusted accordingly.   Return in about 4 months (around 06/26/2017), or if symptoms worsen or fail to improve.  Control of House Dust Mite Allergen  House dust mites play a major role in allergic asthma and rhinitis.  They occur in environments with high humidity wherever human skin, the food for dust mites is found. High levels have been detected in dust obtained from mattresses, pillows, carpets, upholstered furniture, bed covers, clothes and soft toys.  The principal allergen of the house dust mite is found in its feces.  A gram of dust may contain 1,000 mites and 250,000 fecal particles.  Mite antigen is easily measured in the air during house cleaning activities.    1. Encase mattresses, including the box spring, and pillow, in an air tight  cover.  Seal the zipper end of the encased mattresses with wide adhesive tape. 2. Wash the bedding in water of 130 degrees Farenheit weekly.  Avoid cotton comforters/quilts and flannel bedding: the most ideal bed covering is the dacron comforter. 3. Remove all upholstered furniture from the bedroom. 4. Remove carpets, carpet padding, rugs, and non-washable window drapes from the bedroom.  Wash drapes weekly or use plastic window coverings. 5. Remove all non-washable stuffed toys from the bedroom.  Wash stuffed toys weekly. 6. Have the room cleaned frequently with a vacuum cleaner and a damp dust-mop.  The patient should not be in a room which is being cleaned and should wait 1 hour after cleaning before going into the room. 7. Close and seal all heating outlets in the bedroom.  Otherwise, the room will become filled with dust-laden air.  An electric heater can be used to heat the room. 8. Reduce indoor humidity to less than 50%.  Do not use a humidifier.  Control of Mold Allergen  Mold and fungi can grow on a variety of surfaces provided certain temperature and moisture conditions exist.  Outdoor molds grow on plants, decaying vegetation and soil.  The major outdoor mold, Alternaria and Cladosporium, are found in very high numbers during hot and dry conditions.  Generally, a late Summer - Fall peak is seen for common outdoor fungal spores.  Rain will temporarily lower outdoor mold spore count, but counts rise rapidly when the rainy period ends.  The most important indoor molds are Aspergillus and Penicillium.  Dark, humid and poorly ventilated basements are ideal sites for mold growth.  The next most common sites of mold growth are the bathroom and the kitchen.  Outdoor [employer]  Control 1. Use air conditioning and keep windows closed 2. Avoid exposure to decaying vegetation. 3. Avoid leaf raking. 4. Avoid grain handling. 5. Consider wearing a face mask if working in moldy areas.  Indoor Mold  Control 1. Maintain humidity below 50%. 2. Clean washable surfaces with 5% bleach solution. 3. Remove sources e.g. Contaminated carpets.  Control of Cockroach Allergen  Cockroach allergen has been identified as an important cause of acute attacks of asthma, especially in urban settings.  There are fifty-five species of cockroach that exist in the [place], however only three, the [place], [place] species produce allergen that can affect patients with Asthma.  Allergens can be obtained from fecal particles, egg casings and secretions from cockroaches.    1. Remove food sources. 2. Reduce access to water. 3. Seal access and entry points. 4. Spray runways with 0.5-1% Diazinon or Chlorpyrifos 5. Blow boric acid power under stoves and refrigerator. 6. Place bait stations (hydramethylnon) at feeding sites.

## 2017-02-28 DIAGNOSIS — M50322 Other cervical disc degeneration at C5-C6 level: Secondary | ICD-10-CM | POA: Diagnosis not present

## 2017-02-28 DIAGNOSIS — M9903 Segmental and somatic dysfunction of lumbar region: Secondary | ICD-10-CM | POA: Diagnosis not present

## 2017-02-28 DIAGNOSIS — M5136 Other intervertebral disc degeneration, lumbar region: Secondary | ICD-10-CM | POA: Diagnosis not present

## 2017-02-28 DIAGNOSIS — M9901 Segmental and somatic dysfunction of cervical region: Secondary | ICD-10-CM | POA: Diagnosis not present

## 2017-03-07 DIAGNOSIS — M9903 Segmental and somatic dysfunction of lumbar region: Secondary | ICD-10-CM | POA: Diagnosis not present

## 2017-03-07 DIAGNOSIS — M5136 Other intervertebral disc degeneration, lumbar region: Secondary | ICD-10-CM | POA: Diagnosis not present

## 2017-03-07 DIAGNOSIS — M9901 Segmental and somatic dysfunction of cervical region: Secondary | ICD-10-CM | POA: Diagnosis not present

## 2017-03-07 DIAGNOSIS — M50322 Other cervical disc degeneration at C5-C6 level: Secondary | ICD-10-CM | POA: Diagnosis not present

## 2017-03-14 DIAGNOSIS — M9903 Segmental and somatic dysfunction of lumbar region: Secondary | ICD-10-CM | POA: Diagnosis not present

## 2017-03-14 DIAGNOSIS — M9901 Segmental and somatic dysfunction of cervical region: Secondary | ICD-10-CM | POA: Diagnosis not present

## 2017-03-14 DIAGNOSIS — M5136 Other intervertebral disc degeneration, lumbar region: Secondary | ICD-10-CM | POA: Diagnosis not present

## 2017-03-14 DIAGNOSIS — M50322 Other cervical disc degeneration at C5-C6 level: Secondary | ICD-10-CM | POA: Diagnosis not present

## 2017-04-03 DIAGNOSIS — M9903 Segmental and somatic dysfunction of lumbar region: Secondary | ICD-10-CM | POA: Diagnosis not present

## 2017-04-03 DIAGNOSIS — M9901 Segmental and somatic dysfunction of cervical region: Secondary | ICD-10-CM | POA: Diagnosis not present

## 2017-04-03 DIAGNOSIS — M50322 Other cervical disc degeneration at C5-C6 level: Secondary | ICD-10-CM | POA: Diagnosis not present

## 2017-04-03 DIAGNOSIS — M5136 Other intervertebral disc degeneration, lumbar region: Secondary | ICD-10-CM | POA: Diagnosis not present

## 2017-04-11 DIAGNOSIS — M9903 Segmental and somatic dysfunction of lumbar region: Secondary | ICD-10-CM | POA: Diagnosis not present

## 2017-04-11 DIAGNOSIS — M5136 Other intervertebral disc degeneration, lumbar region: Secondary | ICD-10-CM | POA: Diagnosis not present

## 2017-04-11 DIAGNOSIS — M9901 Segmental and somatic dysfunction of cervical region: Secondary | ICD-10-CM | POA: Diagnosis not present

## 2017-04-11 DIAGNOSIS — M50322 Other cervical disc degeneration at C5-C6 level: Secondary | ICD-10-CM | POA: Diagnosis not present

## 2017-04-18 DIAGNOSIS — M50322 Other cervical disc degeneration at C5-C6 level: Secondary | ICD-10-CM | POA: Diagnosis not present

## 2017-04-18 DIAGNOSIS — M5136 Other intervertebral disc degeneration, lumbar region: Secondary | ICD-10-CM | POA: Diagnosis not present

## 2017-04-18 DIAGNOSIS — M9901 Segmental and somatic dysfunction of cervical region: Secondary | ICD-10-CM | POA: Diagnosis not present

## 2017-04-18 DIAGNOSIS — M9903 Segmental and somatic dysfunction of lumbar region: Secondary | ICD-10-CM | POA: Diagnosis not present

## 2017-04-28 DIAGNOSIS — M9903 Segmental and somatic dysfunction of lumbar region: Secondary | ICD-10-CM | POA: Diagnosis not present

## 2017-04-28 DIAGNOSIS — M5136 Other intervertebral disc degeneration, lumbar region: Secondary | ICD-10-CM | POA: Diagnosis not present

## 2017-04-28 DIAGNOSIS — M9901 Segmental and somatic dysfunction of cervical region: Secondary | ICD-10-CM | POA: Diagnosis not present

## 2017-04-28 DIAGNOSIS — M50322 Other cervical disc degeneration at C5-C6 level: Secondary | ICD-10-CM | POA: Diagnosis not present

## 2017-05-05 DIAGNOSIS — M9901 Segmental and somatic dysfunction of cervical region: Secondary | ICD-10-CM | POA: Diagnosis not present

## 2017-05-05 DIAGNOSIS — M50322 Other cervical disc degeneration at C5-C6 level: Secondary | ICD-10-CM | POA: Diagnosis not present

## 2017-05-05 DIAGNOSIS — M5136 Other intervertebral disc degeneration, lumbar region: Secondary | ICD-10-CM | POA: Diagnosis not present

## 2017-05-05 DIAGNOSIS — M9903 Segmental and somatic dysfunction of lumbar region: Secondary | ICD-10-CM | POA: Diagnosis not present

## 2017-05-12 DIAGNOSIS — M5136 Other intervertebral disc degeneration, lumbar region: Secondary | ICD-10-CM | POA: Diagnosis not present

## 2017-05-12 DIAGNOSIS — M9903 Segmental and somatic dysfunction of lumbar region: Secondary | ICD-10-CM | POA: Diagnosis not present

## 2017-05-12 DIAGNOSIS — M9901 Segmental and somatic dysfunction of cervical region: Secondary | ICD-10-CM | POA: Diagnosis not present

## 2017-05-12 DIAGNOSIS — M50322 Other cervical disc degeneration at C5-C6 level: Secondary | ICD-10-CM | POA: Diagnosis not present

## 2017-06-30 ENCOUNTER — Ambulatory Visit (INDEPENDENT_AMBULATORY_CARE_PROVIDER_SITE_OTHER): Payer: PPO | Admitting: Allergy and Immunology

## 2017-06-30 ENCOUNTER — Encounter: Payer: Self-pay | Admitting: Allergy and Immunology

## 2017-06-30 VITALS — BP 140/78 | HR 80 | Resp 16

## 2017-06-30 DIAGNOSIS — J3089 Other allergic rhinitis: Secondary | ICD-10-CM

## 2017-06-30 DIAGNOSIS — J4530 Mild persistent asthma, uncomplicated: Secondary | ICD-10-CM | POA: Diagnosis not present

## 2017-06-30 DIAGNOSIS — M50322 Other cervical disc degeneration at C5-C6 level: Secondary | ICD-10-CM | POA: Diagnosis not present

## 2017-06-30 DIAGNOSIS — M9901 Segmental and somatic dysfunction of cervical region: Secondary | ICD-10-CM | POA: Diagnosis not present

## 2017-06-30 DIAGNOSIS — M5136 Other intervertebral disc degeneration, lumbar region: Secondary | ICD-10-CM | POA: Diagnosis not present

## 2017-06-30 DIAGNOSIS — M9903 Segmental and somatic dysfunction of lumbar region: Secondary | ICD-10-CM | POA: Diagnosis not present

## 2017-06-30 MED ORDER — CARBINOXAMINE MALEATE 4 MG PO TABS: 4.0000 mg | ORAL_TABLET | Freq: Three times a day (TID) | ORAL | 0 refills | Status: DC | PRN

## 2017-06-30 MED ORDER — MONTELUKAST SODIUM 10 MG PO TABS: 10.0000 mg | ORAL_TABLET | Freq: Every day | ORAL | 5 refills | Status: DC

## 2017-06-30 NOTE — Patient Instructions (Addendum)
Mild persistent asthma Today's spirometry results, assessed while asymptomatic, suggest under-perception of bronchoconstriction.  A prescription has been provided for montelukast 10 mg daily at bedtime.  Continue albuterol HFA, 1-2 inhalations every 4-6 hours as needed.  Subjective and objective measures of pulmonary function will be followed and the treatment plan will be adjusted accordingly.  Perennial allergic rhinitis  Continue appropriate allergen avoidance measures, azelastine nasal spray, and fluticasone nasal spray.  I have also recommended nasal saline spray (i.e., Simply Saline) or nasal saline lavage (i.e., NeilMed) as needed and prior to medicated nasal sprays.  A prescription has been provided for carbinoxamine 4-8 mg every 8 hours as needed.  If allergen avoidance measures and medications fail to adequately relieve symptoms, aeroallergen immunotherapy will be considered.   Return in about 5 months (around 11/30/2017), or if symptoms worsen or fail to improve.

## 2017-06-30 NOTE — Assessment & Plan Note (Signed)
   Continue appropriate allergen avoidance measures, azelastine nasal spray, and fluticasone nasal spray.  I have also recommended nasal saline spray (i.e., Simply Saline) or nasal saline lavage (i.e., NeilMed) as needed and prior to medicated nasal sprays.  A prescription has been provided for carbinoxamine 4-8 mg every 8 hours as needed.  If allergen avoidance measures and medications fail to adequately relieve symptoms, aeroallergen immunotherapy will be considered.

## 2017-06-30 NOTE — Progress Notes (Signed)
Follow-up Note  RE: [name] [name] MRN: [id number] DOB: 1946/04/19 Date of Office Visit: 06/30/2017  Primary care provider: Thressa Sheller, MD Referring provider: Thressa Sheller, MD  History of present illness: [name] [name] is a 71 y.o. male with allergic rhinitis and coughing/wheezing presenting today for follow up.  He is previously seen in this clinic for his initial evaluation on 02/24/2017.  He complains of rhinorrhea and sneezing despite compliance with cetirizine, azelastine nasal spray, and fluticasone nasal spray.  He currently does not use nasal saline irrigation.  He also experiences wheezing, chest tightness, and coughing.  He states that he needs an albuterol refill.  He requires albuterol rescue one time per month on average and denies nocturnal awakenings due to lower respiratory symptoms.   Assessment and plan: Mild persistent asthma Today's spirometry results, assessed while asymptomatic, suggest under-perception of bronchoconstriction.  A prescription has been provided for montelukast 10 mg daily at bedtime.  Continue albuterol HFA, 1-2 inhalations every 4-6 hours as needed.  Subjective and objective measures of pulmonary function will be followed and the treatment plan will be adjusted accordingly.  Perennial allergic rhinitis  Continue appropriate allergen avoidance measures, azelastine nasal spray, and fluticasone nasal spray.  I have also recommended nasal saline spray (i.e., Simply Saline) or nasal saline lavage (i.e., NeilMed) as needed and prior to medicated nasal sprays.  A prescription has been provided for carbinoxamine 4-8 mg every 8 hours as needed.  If allergen avoidance measures and medications fail to adequately relieve symptoms, aeroallergen immunotherapy will be considered.   Meds ordered this encounter  Medications  . montelukast (SINGULAIR) 10 MG tablet    Sig: Take 1 tablet (10 mg total) by mouth at bedtime.    Dispense:  30 tablet    Refill:  5  . Carbinoxamine Maleate 4 MG TABS    Sig: Take 1-2 tablets (4-8 mg total) by mouth 3 (three) times daily as needed.    Dispense:  56 each    Refill:  0    Diagnostics: Spirometry reveals an FVC of 3.40 L and an FEV1 of 2.49 L (75% predicted) with 210 mL postbronchodilator improvement.  This study was performed while the patient was asymptomatic.  Please see scanned spirometry results for details.    Physical examination: Blood pressure 140/78, pulse 80, resp. rate 16.  General: Alert, interactive, in no acute distress. HEENT: TMs pearly gray, turbinates moderately edematous with clear discharge, post-pharynx mildly erythematous. Neck: Supple without lymphadenopathy. Lungs: Clear to auscultation without wheezing, rhonchi or rales. CV: Normal S1, S2 without murmurs. Skin: Warm and dry, without lesions or rashes.  The following portions of the patient's history were reviewed and updated as appropriate: allergies, current medications, past family history, past medical history, past social history, past surgical history and problem list.  Allergies as of 06/30/2017      Reactions   Percocet [oxycodone-acetaminophen] Nausea And Vomiting   Sulfonamide Derivatives Hives      Medication List       Accurate as of 06/30/17  5:15 PM. Always use your most recent med list.          acetaminophen 500 MG tablet Commonly known as:  TYLENOL Take 1,000 mg by mouth at bedtime as needed (pain).   albuterol 108 (90 Base) MCG/ACT inhaler Commonly known as:  PROVENTIL HFA;VENTOLIN HFA Inhale 2 puffs into the lungs every 4 (four) hours as needed for wheezing or shortness of breath.   azelastine 0.1 % nasal spray  Commonly known as:  ASTELIN 1-2 sprays per nostril 2 times a day as needed.   Carbinoxamine Maleate 4 MG Tabs Take 1-2 tablets (4-8 mg total) by mouth 3 (three) times daily as needed.   cetirizine 10 MG tablet Commonly known as:  ZYRTEC Take 10 mg by mouth daily.     diphenhydrAMINE 25 MG tablet Commonly known as:  BENADRYL Take 25 mg by mouth at bedtime as needed for allergies.   fluticasone 50 MCG/ACT nasal spray Commonly known as:  FLONASE Place 1 spray into both nostrils 2 (two) times daily as needed for allergies.   loratadine 10 MG tablet Commonly known as:  CLARITIN Take 10 mg by mouth daily as needed for allergies.   losartan 50 MG tablet Commonly known as:  COZAAR Take 50 mg by mouth at bedtime.   montelukast 10 MG tablet Commonly known as:  SINGULAIR Take 1 tablet (10 mg total) by mouth at bedtime.   pantoprazole 40 MG tablet Commonly known as:  PROTONIX Take 1 tablet (40 mg total) by mouth daily.   VITAMIN D PO Take 1 tablet by mouth daily.       Allergies  Allergen Reactions  . Percocet [Oxycodone-Acetaminophen] Nausea And Vomiting  . Sulfonamide Derivatives Hives   Review of systems: Review of systems negative except as noted in HPI / PMHx or noted below: Constitutional: Negative.  HENT: Negative.   Eyes: Negative.  Respiratory: Negative.   Cardiovascular: Negative.  Gastrointestinal: Negative.  Genitourinary: Negative.  Musculoskeletal: Negative.  Neurological: Negative.  Endo/Heme/Allergies: Negative.  Cutaneous: Negative.  Past Medical History:  Diagnosis Date  . Asthma   . Blood transfusion   . Constipation   . Diverticulitis   . Diverticulosis   . Eczema   . Esophageal stricture   . GERD (gastroesophageal reflux disease)   . HBP (high blood pressure)   . Hyperlipemia   . Kidney stones   . Trouble swallowing   . Ulcer     Family History  Problem Relation Age of Onset  . Heart disease Father 36       heart attack  . Colon cancer Neg Hx   . Allergic rhinitis Neg Hx   . Angioedema Neg Hx   . Asthma Neg Hx   . Eczema Neg Hx   . Immunodeficiency Neg Hx   . Urticaria Neg Hx     Social History   Social History  . Marital status: Married    Spouse name: N/A  . Number of children: 2  .  Years of education: N/A   Occupational History  . International aid/development worker [employer]   Social History Main Topics  . Smoking status: Former Smoker    Types: Cigarettes    Quit date: 11/25/1981  . Smokeless tobacco: Never Used  . Alcohol use Yes  . Drug use: No  . Sexual activity: No   Other Topics Concern  . Not on file   Social History Narrative   2 daily caffeine drinks    I appreciate the opportunity to take part in [name] [name]'s care. Please do not hesitate to contact me with questions.  Sincerely,   R. Edgar Frisk, MD

## 2017-06-30 NOTE — Assessment & Plan Note (Signed)
Today's spirometry results, assessed while asymptomatic, suggest under-perception of bronchoconstriction.  A prescription has been provided for montelukast 10 mg daily at bedtime.  Continue albuterol HFA, 1-2 inhalations every 4-6 hours as needed.  Subjective and objective measures of pulmonary function will be followed and the treatment plan will be adjusted accordingly.

## 2017-07-02 ENCOUNTER — Telehealth: Payer: Self-pay | Admitting: Allergy and Immunology

## 2017-07-02 DIAGNOSIS — R062 Wheezing: Secondary | ICD-10-CM

## 2017-07-02 MED ORDER — ALBUTEROL SULFATE HFA 108 (90 BASE) MCG/ACT IN AERS: 2.0000 | INHALATION_SPRAY | RESPIRATORY_TRACT | 1 refills | Status: DC | PRN

## 2017-07-02 NOTE — Telephone Encounter (Signed)
Left message advising albuterol was sent in

## 2017-07-02 NOTE — Telephone Encounter (Signed)
Pt came in the the other day and his albuterol inhaler was not called into [facility].

## 2017-07-02 NOTE — Telephone Encounter (Signed)
rx refill sent in  

## 2017-07-25 DIAGNOSIS — M9901 Segmental and somatic dysfunction of cervical region: Secondary | ICD-10-CM | POA: Diagnosis not present

## 2017-07-25 DIAGNOSIS — E785 Hyperlipidemia, unspecified: Secondary | ICD-10-CM | POA: Diagnosis not present

## 2017-07-25 DIAGNOSIS — I129 Hypertensive chronic kidney disease with stage 1 through stage 4 chronic kidney disease, or unspecified chronic kidney disease: Secondary | ICD-10-CM | POA: Diagnosis not present

## 2017-07-25 DIAGNOSIS — M9903 Segmental and somatic dysfunction of lumbar region: Secondary | ICD-10-CM | POA: Diagnosis not present

## 2017-07-25 DIAGNOSIS — E559 Vitamin D deficiency, unspecified: Secondary | ICD-10-CM | POA: Diagnosis not present

## 2017-07-25 DIAGNOSIS — M5136 Other intervertebral disc degeneration, lumbar region: Secondary | ICD-10-CM | POA: Diagnosis not present

## 2017-07-25 DIAGNOSIS — M50322 Other cervical disc degeneration at C5-C6 level: Secondary | ICD-10-CM | POA: Diagnosis not present

## 2017-08-04 DIAGNOSIS — K219 Gastro-esophageal reflux disease without esophagitis: Secondary | ICD-10-CM | POA: Diagnosis not present

## 2017-08-04 DIAGNOSIS — E559 Vitamin D deficiency, unspecified: Secondary | ICD-10-CM | POA: Diagnosis not present

## 2017-08-04 DIAGNOSIS — I129 Hypertensive chronic kidney disease with stage 1 through stage 4 chronic kidney disease, or unspecified chronic kidney disease: Secondary | ICD-10-CM | POA: Diagnosis not present

## 2017-08-04 DIAGNOSIS — J302 Other seasonal allergic rhinitis: Secondary | ICD-10-CM | POA: Diagnosis not present

## 2017-08-04 DIAGNOSIS — R5383 Other fatigue: Secondary | ICD-10-CM | POA: Diagnosis not present

## 2017-08-04 DIAGNOSIS — N529 Male erectile dysfunction, unspecified: Secondary | ICD-10-CM | POA: Diagnosis not present

## 2017-08-11 DIAGNOSIS — M50322 Other cervical disc degeneration at C5-C6 level: Secondary | ICD-10-CM | POA: Diagnosis not present

## 2017-08-11 DIAGNOSIS — M5136 Other intervertebral disc degeneration, lumbar region: Secondary | ICD-10-CM | POA: Diagnosis not present

## 2017-08-11 DIAGNOSIS — M9903 Segmental and somatic dysfunction of lumbar region: Secondary | ICD-10-CM | POA: Diagnosis not present

## 2017-08-11 DIAGNOSIS — M9901 Segmental and somatic dysfunction of cervical region: Secondary | ICD-10-CM | POA: Diagnosis not present

## 2017-09-01 DIAGNOSIS — M5136 Other intervertebral disc degeneration, lumbar region: Secondary | ICD-10-CM | POA: Diagnosis not present

## 2017-09-01 DIAGNOSIS — M9904 Segmental and somatic dysfunction of sacral region: Secondary | ICD-10-CM | POA: Diagnosis not present

## 2017-09-01 DIAGNOSIS — M9905 Segmental and somatic dysfunction of pelvic region: Secondary | ICD-10-CM | POA: Diagnosis not present

## 2017-09-01 DIAGNOSIS — M9903 Segmental and somatic dysfunction of lumbar region: Secondary | ICD-10-CM | POA: Diagnosis not present

## 2017-09-12 ENCOUNTER — Other Ambulatory Visit: Payer: Self-pay | Admitting: Internal Medicine

## 2017-09-15 DIAGNOSIS — M9903 Segmental and somatic dysfunction of lumbar region: Secondary | ICD-10-CM | POA: Diagnosis not present

## 2017-09-15 DIAGNOSIS — M5136 Other intervertebral disc degeneration, lumbar region: Secondary | ICD-10-CM | POA: Diagnosis not present

## 2017-09-15 DIAGNOSIS — M9905 Segmental and somatic dysfunction of pelvic region: Secondary | ICD-10-CM | POA: Diagnosis not present

## 2017-09-15 DIAGNOSIS — M9904 Segmental and somatic dysfunction of sacral region: Secondary | ICD-10-CM | POA: Diagnosis not present

## 2017-09-22 ENCOUNTER — Ambulatory Visit (HOSPITAL_COMMUNITY)
Admission: EM | Admit: 2017-09-22 | Discharge: 2017-09-22 | Disposition: A | Discharge status: Home / Self Care | Payer: PPO | Attending: Emergency Medicine | Admitting: Emergency Medicine

## 2017-09-22 ENCOUNTER — Encounter (HOSPITAL_COMMUNITY): Payer: Self-pay | Admitting: Emergency Medicine

## 2017-09-22 DIAGNOSIS — N451 Epididymitis: Secondary | ICD-10-CM | POA: Diagnosis not present

## 2017-09-22 DIAGNOSIS — M9903 Segmental and somatic dysfunction of lumbar region: Secondary | ICD-10-CM | POA: Diagnosis not present

## 2017-09-22 DIAGNOSIS — M5136 Other intervertebral disc degeneration, lumbar region: Secondary | ICD-10-CM | POA: Diagnosis not present

## 2017-09-22 DIAGNOSIS — M9904 Segmental and somatic dysfunction of sacral region: Secondary | ICD-10-CM | POA: Diagnosis not present

## 2017-09-22 DIAGNOSIS — M9905 Segmental and somatic dysfunction of pelvic region: Secondary | ICD-10-CM | POA: Diagnosis not present

## 2017-09-22 MED ORDER — LEVOFLOXACIN 500 MG PO TABS: 500.0000 mg | ORAL_TABLET | Freq: Every day | ORAL | 0 refills | Status: DC

## 2017-09-22 NOTE — ED Provider Notes (Addendum)
[place]    CSN: [id number] Arrival date & time: 09/22/17  1505     History   Chief Complaint Chief Complaint  Patient presents with  . Testicle Pain  . Groin Pain    HPI [name] [name] is a 71 y.o. male.   71 year old male complaining of left testicular pain that occasionally radiated into the pelvis/abdomen the past 3 weeks. Gradual onset. Tends to come and go. Sometimes movement such as ambulation makes it worse. He is able to palpate what he describes as "knots" in the left testicle. Denies urinary symptoms.      Past Medical History:  Diagnosis Date  . Asthma   . Blood transfusion   . Constipation   . Diverticulitis   . Diverticulosis   . Eczema   . Esophageal stricture   . GERD (gastroesophageal reflux disease)   . HBP (high blood pressure)   . Hyperlipemia   . Kidney stones   . Trouble swallowing   . Ulcer     Patient Active Problem List   Diagnosis Date Noted  . Perennial allergic rhinitis 02/24/2017  . Mild persistent asthma 02/24/2017  . Coughing 02/24/2017  . H/O: GI bleed 08/14/2012  . Ulcer of esophagus with bleeding 08/14/2012  . S/P Nissen fundoplication (without gastrostomy tube) procedure 11/08/2011    Past Surgical History:  Procedure Laterality Date  . HIATAL HERNIA REPAIR  1992&2012  . LAPAROSCOPIC NISSEN FUNDOPLICATION  0623, 76/28/31       Home Medications    Prior to Admission medications   Medication Sig Start Date End Date Taking? Authorizing Provider  acetaminophen (TYLENOL) 500 MG tablet Take 1,000 mg by mouth at bedtime as needed (pain).   Yes [provider]  albuterol (PROVENTIL HFA;VENTOLIN HFA) 108 (90 Base) MCG/ACT inhaler Inhale 2 puffs into the lungs every 4 (four) hours as needed for wheezing or shortness of breath. 07/02/17  Yes Bobbitt, Sedalia Muta, MD  azelastine (ASTELIN) 0.1 % nasal spray 1-2 sprays per nostril 2 times a day as needed. 02/24/17  Yes Bobbitt, Sedalia Muta, MD    Carbinoxamine Maleate 4 MG TABS Take 1-2 tablets (4-8 mg total) by mouth 3 (three) times daily as needed. 06/30/17  Yes Bobbitt, Sedalia Muta, MD  cetirizine (ZYRTEC) 10 MG tablet Take 10 mg by mouth daily.   Yes [provider]  Cholecalciferol (VITAMIN D PO) Take 1 tablet by mouth daily.   Yes [provider]  diphenhydrAMINE (BENADRYL) 25 MG tablet Take 25 mg by mouth at bedtime as needed for allergies.   Yes [provider]  fluticasone (FLONASE) 50 MCG/ACT nasal spray Place 1 spray into both nostrils 2 (two) times daily as needed for allergies.  07/22/11  Yes [provider]  loratadine (CLARITIN) 10 MG tablet Take 10 mg by mouth daily as needed for allergies.   Yes [provider]  losartan (COZAAR) 50 MG tablet Take 50 mg by mouth at bedtime.  08/17/11  Yes [provider]  montelukast (SINGULAIR) 10 MG tablet Take 1 tablet (10 mg total) by mouth at bedtime. 06/30/17  Yes Bobbitt, Sedalia Muta, MD  pantoprazole (PROTONIX) 40 MG tablet TAKE 1 TABLET BY MOUTH EVERY DAY 09/15/17  Yes Irene Shipper, MD  levofloxacin (LEVAQUIN) 500 MG tablet Take 1 tablet (500 mg total) by mouth daily. 09/22/17   Janne Napoleon, NP    Family History Family History  Problem Relation Age of Onset  . Heart disease Father 49  heart attack  . Colon cancer Neg Hx   . Allergic rhinitis Neg Hx   . Angioedema Neg Hx   . Asthma Neg Hx   . Eczema Neg Hx   . Immunodeficiency Neg Hx   . Urticaria Neg Hx     Social History Social History  Substance Use Topics  . Smoking status: Former Smoker    Types: Cigarettes    Quit date: 11/25/1981  . Smokeless tobacco: Never Used  . Alcohol use Yes     Allergies   Percocet [oxycodone-acetaminophen] and Sulfonamide derivatives   Review of Systems Review of Systems  Constitutional: Negative.   Respiratory: Negative.   Gastrointestinal: Negative.   Genitourinary: Positive for testicular pain. Negative for dysuria,  frequency, penile pain and scrotal swelling.  All other systems reviewed and are negative.    Physical Exam Triage Vital Signs ED Triage Vitals  Enc Vitals Group     BP 09/22/17 1545 (!) 160/81     Pulse Rate 09/22/17 1545 70     Resp 09/22/17 1545 16     Temp 09/22/17 1545 98.1 F (36.7 C)     Temp src --      SpO2 09/22/17 1545 97 %     Weight --      Height --      Head Circumference --      Peak Flow --      Pain Score 09/22/17 1547 3     Pain Loc --      Pain Edu? --      Excl. in [place]? --    No data found.   Updated Vital Signs BP (!) 160/81   Pulse 70   Temp 98.1 F (36.7 C)   Resp 16   SpO2 97%   Visual Acuity Right Eye Distance:   Left Eye Distance:   Bilateral Distance:    Right Eye Near:   Left Eye Near:    Bilateral Near:     Physical Exam  Constitutional: He is oriented to person, place, and time. He appears well-developed and well-nourished. No distress.  Eyes: EOM are normal.  Neck: Normal range of motion. Neck supple.  Cardiovascular: Normal rate.   Pulmonary/Chest: Effort normal. No respiratory distress.  Genitourinary: Penis normal. No penile tenderness.  Genitourinary Comments: Right testicle without tenderness. Oriented vertically. No swelling. Right testicle mildly enlarged. There is tenderness to the posterior and inferior aspect of the left testicle with areas irregular, knotty, lumpy mass. The masses firm to soft. Not hard. Slightly mobile. No other abnormal scrotal contents are palpated. No apparent fluid. No direct tenderness to the testicle.  Musculoskeletal: He exhibits no edema.  Neurological: He is alert and oriented to person, place, and time. He exhibits normal muscle tone.  Skin: Skin is warm and dry.  Psychiatric: He has a normal mood and affect.  Nursing note and vitals reviewed.    UC Treatments / Results  Labs (all labs ordered are listed, but only abnormal results are displayed) Labs Reviewed - No data to  display  EKG  EKG Interpretation None       Radiology No results found.  Procedures Procedures (including critical care time)  Medications Ordered in UC Medications - No data to display   Initial Impression / Assessment and Plan / UC Course  I have reviewed the triage vital signs and the nursing notes.  Pertinent labs & imaging results that were available during my care of the patient were reviewed by  me and considered in my medical decision making (see chart for details).    Read the instructions were better clarification. Take the medication as directed. If you develop diarrhea while taking this medicine stopped taking it as it could be a serious side effect called C. Difficile. Most of the time people do well with this antibiotic. It is recommended antibiotic for this problem in your age group. If not getting better in 4-5 days or for some reason getting worse call the urologist for follow-up appointment. If your getting well in the condition completely goes away and there is no more swelling or not so or pain you may not need to follow-up.    Final Clinical Impressions(s) / UC Diagnoses   Final diagnoses:  Left epididymitis    New Prescriptions New Prescriptions   LEVOFLOXACIN (LEVAQUIN) 500 MG TABLET    Take 1 tablet (500 mg total) by mouth daily.     Controlled Substance Prescriptions [place] Controlled Substance Registry consulted? Not Applicable   Janne Napoleon, NP 09/22/17 1641    Janne Napoleon, NP 09/22/17 1646

## 2017-09-22 NOTE — ED Triage Notes (Signed)
Pt c/o lower groin pain in the left side, pt states its swollen, and feels like theres knots there. Seems to radiate up into stomach. Pt also c/o testicle pain and swelling to L testicle.

## 2017-09-22 NOTE — Discharge Instructions (Signed)
Read the instructions were better clarification. Take the medication as directed. If you develop diarrhea while taking this medicine stopped taking it as it could be a serious side effect called C. Difficile. Most of the time people do well with this antibiotic. It is recommended antibiotic for this problem in your age group. If not getting better in 4-5 days or for some reason getting worse call the urologist for follow-up appointment. If your getting well in the condition completely goes away and there is no more swelling or not so or pain you may not need to follow-up.

## 2017-10-10 DIAGNOSIS — M9905 Segmental and somatic dysfunction of pelvic region: Secondary | ICD-10-CM | POA: Diagnosis not present

## 2017-10-10 DIAGNOSIS — M9904 Segmental and somatic dysfunction of sacral region: Secondary | ICD-10-CM | POA: Diagnosis not present

## 2017-10-10 DIAGNOSIS — M5136 Other intervertebral disc degeneration, lumbar region: Secondary | ICD-10-CM | POA: Diagnosis not present

## 2017-10-10 DIAGNOSIS — M9903 Segmental and somatic dysfunction of lumbar region: Secondary | ICD-10-CM | POA: Diagnosis not present

## 2017-10-15 DIAGNOSIS — M9905 Segmental and somatic dysfunction of pelvic region: Secondary | ICD-10-CM | POA: Diagnosis not present

## 2017-10-15 DIAGNOSIS — M9904 Segmental and somatic dysfunction of sacral region: Secondary | ICD-10-CM | POA: Diagnosis not present

## 2017-10-15 DIAGNOSIS — M5136 Other intervertebral disc degeneration, lumbar region: Secondary | ICD-10-CM | POA: Diagnosis not present

## 2017-10-15 DIAGNOSIS — M9903 Segmental and somatic dysfunction of lumbar region: Secondary | ICD-10-CM | POA: Diagnosis not present

## 2017-10-20 DIAGNOSIS — M5136 Other intervertebral disc degeneration, lumbar region: Secondary | ICD-10-CM | POA: Diagnosis not present

## 2017-10-20 DIAGNOSIS — M9904 Segmental and somatic dysfunction of sacral region: Secondary | ICD-10-CM | POA: Diagnosis not present

## 2017-10-20 DIAGNOSIS — M9905 Segmental and somatic dysfunction of pelvic region: Secondary | ICD-10-CM | POA: Diagnosis not present

## 2017-10-20 DIAGNOSIS — M9903 Segmental and somatic dysfunction of lumbar region: Secondary | ICD-10-CM | POA: Diagnosis not present

## 2017-11-14 DIAGNOSIS — M9904 Segmental and somatic dysfunction of sacral region: Secondary | ICD-10-CM | POA: Diagnosis not present

## 2017-11-14 DIAGNOSIS — M9905 Segmental and somatic dysfunction of pelvic region: Secondary | ICD-10-CM | POA: Diagnosis not present

## 2017-11-14 DIAGNOSIS — M9903 Segmental and somatic dysfunction of lumbar region: Secondary | ICD-10-CM | POA: Diagnosis not present

## 2017-11-14 DIAGNOSIS — M5136 Other intervertebral disc degeneration, lumbar region: Secondary | ICD-10-CM | POA: Diagnosis not present

## 2017-12-08 DIAGNOSIS — M5134 Other intervertebral disc degeneration, thoracic region: Secondary | ICD-10-CM | POA: Diagnosis not present

## 2017-12-08 DIAGNOSIS — M5033 Other cervical disc degeneration, cervicothoracic region: Secondary | ICD-10-CM | POA: Diagnosis not present

## 2017-12-08 DIAGNOSIS — M9902 Segmental and somatic dysfunction of thoracic region: Secondary | ICD-10-CM | POA: Diagnosis not present

## 2017-12-08 DIAGNOSIS — M9901 Segmental and somatic dysfunction of cervical region: Secondary | ICD-10-CM | POA: Diagnosis not present

## 2017-12-11 DIAGNOSIS — R3912 Poor urinary stream: Secondary | ICD-10-CM | POA: Diagnosis not present

## 2017-12-11 DIAGNOSIS — N5201 Erectile dysfunction due to arterial insufficiency: Secondary | ICD-10-CM | POA: Diagnosis not present

## 2017-12-11 DIAGNOSIS — Z125 Encounter for screening for malignant neoplasm of prostate: Secondary | ICD-10-CM | POA: Diagnosis not present

## 2017-12-12 DIAGNOSIS — M5033 Other cervical disc degeneration, cervicothoracic region: Secondary | ICD-10-CM | POA: Diagnosis not present

## 2017-12-12 DIAGNOSIS — M9902 Segmental and somatic dysfunction of thoracic region: Secondary | ICD-10-CM | POA: Diagnosis not present

## 2017-12-12 DIAGNOSIS — M5134 Other intervertebral disc degeneration, thoracic region: Secondary | ICD-10-CM | POA: Diagnosis not present

## 2017-12-12 DIAGNOSIS — M9901 Segmental and somatic dysfunction of cervical region: Secondary | ICD-10-CM | POA: Diagnosis not present

## 2017-12-22 DIAGNOSIS — M9901 Segmental and somatic dysfunction of cervical region: Secondary | ICD-10-CM | POA: Diagnosis not present

## 2017-12-22 DIAGNOSIS — M9902 Segmental and somatic dysfunction of thoracic region: Secondary | ICD-10-CM | POA: Diagnosis not present

## 2017-12-22 DIAGNOSIS — M5033 Other cervical disc degeneration, cervicothoracic region: Secondary | ICD-10-CM | POA: Diagnosis not present

## 2017-12-22 DIAGNOSIS — M5134 Other intervertebral disc degeneration, thoracic region: Secondary | ICD-10-CM | POA: Diagnosis not present

## 2017-12-29 DIAGNOSIS — M5134 Other intervertebral disc degeneration, thoracic region: Secondary | ICD-10-CM | POA: Diagnosis not present

## 2017-12-29 DIAGNOSIS — M5033 Other cervical disc degeneration, cervicothoracic region: Secondary | ICD-10-CM | POA: Diagnosis not present

## 2017-12-29 DIAGNOSIS — M9901 Segmental and somatic dysfunction of cervical region: Secondary | ICD-10-CM | POA: Diagnosis not present

## 2017-12-29 DIAGNOSIS — M9902 Segmental and somatic dysfunction of thoracic region: Secondary | ICD-10-CM | POA: Diagnosis not present

## 2018-01-09 DIAGNOSIS — R3912 Poor urinary stream: Secondary | ICD-10-CM | POA: Diagnosis not present

## 2018-01-09 DIAGNOSIS — N5201 Erectile dysfunction due to arterial insufficiency: Secondary | ICD-10-CM | POA: Diagnosis not present

## 2018-01-16 DIAGNOSIS — M9902 Segmental and somatic dysfunction of thoracic region: Secondary | ICD-10-CM | POA: Diagnosis not present

## 2018-01-16 DIAGNOSIS — M9901 Segmental and somatic dysfunction of cervical region: Secondary | ICD-10-CM | POA: Diagnosis not present

## 2018-01-16 DIAGNOSIS — M5134 Other intervertebral disc degeneration, thoracic region: Secondary | ICD-10-CM | POA: Diagnosis not present

## 2018-01-16 DIAGNOSIS — M5033 Other cervical disc degeneration, cervicothoracic region: Secondary | ICD-10-CM | POA: Diagnosis not present

## 2018-01-23 DIAGNOSIS — M5033 Other cervical disc degeneration, cervicothoracic region: Secondary | ICD-10-CM | POA: Diagnosis not present

## 2018-01-23 DIAGNOSIS — M9902 Segmental and somatic dysfunction of thoracic region: Secondary | ICD-10-CM | POA: Diagnosis not present

## 2018-01-23 DIAGNOSIS — M9901 Segmental and somatic dysfunction of cervical region: Secondary | ICD-10-CM | POA: Diagnosis not present

## 2018-01-23 DIAGNOSIS — M5134 Other intervertebral disc degeneration, thoracic region: Secondary | ICD-10-CM | POA: Diagnosis not present

## 2018-01-26 DIAGNOSIS — E559 Vitamin D deficiency, unspecified: Secondary | ICD-10-CM | POA: Diagnosis not present

## 2018-01-26 DIAGNOSIS — Z125 Encounter for screening for malignant neoplasm of prostate: Secondary | ICD-10-CM | POA: Diagnosis not present

## 2018-01-26 DIAGNOSIS — B379 Candidiasis, unspecified: Secondary | ICD-10-CM | POA: Diagnosis not present

## 2018-01-26 DIAGNOSIS — Z0000 Encounter for general adult medical examination without abnormal findings: Secondary | ICD-10-CM | POA: Diagnosis not present

## 2018-01-26 DIAGNOSIS — I129 Hypertensive chronic kidney disease with stage 1 through stage 4 chronic kidney disease, or unspecified chronic kidney disease: Secondary | ICD-10-CM | POA: Diagnosis not present

## 2018-02-02 DIAGNOSIS — M5033 Other cervical disc degeneration, cervicothoracic region: Secondary | ICD-10-CM | POA: Diagnosis not present

## 2018-02-02 DIAGNOSIS — I129 Hypertensive chronic kidney disease with stage 1 through stage 4 chronic kidney disease, or unspecified chronic kidney disease: Secondary | ICD-10-CM | POA: Diagnosis not present

## 2018-02-02 DIAGNOSIS — R740 Nonspecific elevation of levels of transaminase and lactic acid dehydrogenase [LDH]: Secondary | ICD-10-CM | POA: Diagnosis not present

## 2018-02-02 DIAGNOSIS — E559 Vitamin D deficiency, unspecified: Secondary | ICD-10-CM | POA: Diagnosis not present

## 2018-02-02 DIAGNOSIS — R911 Solitary pulmonary nodule: Secondary | ICD-10-CM | POA: Diagnosis not present

## 2018-02-02 DIAGNOSIS — M5134 Other intervertebral disc degeneration, thoracic region: Secondary | ICD-10-CM | POA: Diagnosis not present

## 2018-02-02 DIAGNOSIS — J302 Other seasonal allergic rhinitis: Secondary | ICD-10-CM | POA: Diagnosis not present

## 2018-02-02 DIAGNOSIS — E785 Hyperlipidemia, unspecified: Secondary | ICD-10-CM | POA: Diagnosis not present

## 2018-02-02 DIAGNOSIS — R5383 Other fatigue: Secondary | ICD-10-CM | POA: Diagnosis not present

## 2018-02-02 DIAGNOSIS — N182 Chronic kidney disease, stage 2 (mild): Secondary | ICD-10-CM | POA: Diagnosis not present

## 2018-02-02 DIAGNOSIS — M9902 Segmental and somatic dysfunction of thoracic region: Secondary | ICD-10-CM | POA: Diagnosis not present

## 2018-02-02 DIAGNOSIS — Z0000 Encounter for general adult medical examination without abnormal findings: Secondary | ICD-10-CM | POA: Diagnosis not present

## 2018-02-02 DIAGNOSIS — M9901 Segmental and somatic dysfunction of cervical region: Secondary | ICD-10-CM | POA: Diagnosis not present

## 2018-02-02 DIAGNOSIS — K219 Gastro-esophageal reflux disease without esophagitis: Secondary | ICD-10-CM | POA: Diagnosis not present

## 2018-02-09 DIAGNOSIS — M9902 Segmental and somatic dysfunction of thoracic region: Secondary | ICD-10-CM | POA: Diagnosis not present

## 2018-02-09 DIAGNOSIS — M5033 Other cervical disc degeneration, cervicothoracic region: Secondary | ICD-10-CM | POA: Diagnosis not present

## 2018-02-09 DIAGNOSIS — L821 Other seborrheic keratosis: Secondary | ICD-10-CM | POA: Diagnosis not present

## 2018-02-09 DIAGNOSIS — Z872 Personal history of diseases of the skin and subcutaneous tissue: Secondary | ICD-10-CM | POA: Diagnosis not present

## 2018-02-09 DIAGNOSIS — M5134 Other intervertebral disc degeneration, thoracic region: Secondary | ICD-10-CM | POA: Diagnosis not present

## 2018-02-09 DIAGNOSIS — D485 Neoplasm of uncertain behavior of skin: Secondary | ICD-10-CM | POA: Diagnosis not present

## 2018-02-09 DIAGNOSIS — D1801 Hemangioma of skin and subcutaneous tissue: Secondary | ICD-10-CM | POA: Diagnosis not present

## 2018-02-09 DIAGNOSIS — D2272 Melanocytic nevi of left lower limb, including hip: Secondary | ICD-10-CM | POA: Diagnosis not present

## 2018-02-09 DIAGNOSIS — L309 Dermatitis, unspecified: Secondary | ICD-10-CM | POA: Diagnosis not present

## 2018-02-09 DIAGNOSIS — L820 Inflamed seborrheic keratosis: Secondary | ICD-10-CM | POA: Diagnosis not present

## 2018-02-09 DIAGNOSIS — M9901 Segmental and somatic dysfunction of cervical region: Secondary | ICD-10-CM | POA: Diagnosis not present

## 2018-02-09 DIAGNOSIS — L814 Other melanin hyperpigmentation: Secondary | ICD-10-CM | POA: Diagnosis not present

## 2018-02-09 DIAGNOSIS — D225 Melanocytic nevi of trunk: Secondary | ICD-10-CM | POA: Diagnosis not present

## 2018-02-09 DIAGNOSIS — Z23 Encounter for immunization: Secondary | ICD-10-CM | POA: Diagnosis not present

## 2018-02-16 DIAGNOSIS — M5134 Other intervertebral disc degeneration, thoracic region: Secondary | ICD-10-CM | POA: Diagnosis not present

## 2018-02-16 DIAGNOSIS — M9902 Segmental and somatic dysfunction of thoracic region: Secondary | ICD-10-CM | POA: Diagnosis not present

## 2018-02-16 DIAGNOSIS — M9901 Segmental and somatic dysfunction of cervical region: Secondary | ICD-10-CM | POA: Diagnosis not present

## 2018-02-16 DIAGNOSIS — M5033 Other cervical disc degeneration, cervicothoracic region: Secondary | ICD-10-CM | POA: Diagnosis not present

## 2018-02-23 DIAGNOSIS — M9902 Segmental and somatic dysfunction of thoracic region: Secondary | ICD-10-CM | POA: Diagnosis not present

## 2018-02-23 DIAGNOSIS — M5033 Other cervical disc degeneration, cervicothoracic region: Secondary | ICD-10-CM | POA: Diagnosis not present

## 2018-02-23 DIAGNOSIS — M9901 Segmental and somatic dysfunction of cervical region: Secondary | ICD-10-CM | POA: Diagnosis not present

## 2018-02-23 DIAGNOSIS — M5134 Other intervertebral disc degeneration, thoracic region: Secondary | ICD-10-CM | POA: Diagnosis not present

## 2018-03-02 DIAGNOSIS — D485 Neoplasm of uncertain behavior of skin: Secondary | ICD-10-CM | POA: Diagnosis not present

## 2018-03-02 DIAGNOSIS — L988 Other specified disorders of the skin and subcutaneous tissue: Secondary | ICD-10-CM | POA: Diagnosis not present

## 2018-03-08 ENCOUNTER — Other Ambulatory Visit: Payer: Self-pay | Admitting: Allergy and Immunology

## 2018-03-08 DIAGNOSIS — J3089 Other allergic rhinitis: Secondary | ICD-10-CM

## 2018-03-08 DIAGNOSIS — J4530 Mild persistent asthma, uncomplicated: Secondary | ICD-10-CM

## 2018-03-09 DIAGNOSIS — M5134 Other intervertebral disc degeneration, thoracic region: Secondary | ICD-10-CM | POA: Diagnosis not present

## 2018-03-09 DIAGNOSIS — M5033 Other cervical disc degeneration, cervicothoracic region: Secondary | ICD-10-CM | POA: Diagnosis not present

## 2018-03-09 DIAGNOSIS — M9901 Segmental and somatic dysfunction of cervical region: Secondary | ICD-10-CM | POA: Diagnosis not present

## 2018-03-09 DIAGNOSIS — M9902 Segmental and somatic dysfunction of thoracic region: Secondary | ICD-10-CM | POA: Diagnosis not present

## 2018-03-09 NOTE — Telephone Encounter (Signed)
Courtesy refill  

## 2018-03-13 ENCOUNTER — Other Ambulatory Visit: Payer: Self-pay | Admitting: Allergy and Immunology

## 2018-03-13 DIAGNOSIS — J3089 Other allergic rhinitis: Secondary | ICD-10-CM

## 2018-03-13 DIAGNOSIS — J4530 Mild persistent asthma, uncomplicated: Secondary | ICD-10-CM

## 2018-03-16 DIAGNOSIS — M9902 Segmental and somatic dysfunction of thoracic region: Secondary | ICD-10-CM | POA: Diagnosis not present

## 2018-03-16 DIAGNOSIS — M5033 Other cervical disc degeneration, cervicothoracic region: Secondary | ICD-10-CM | POA: Diagnosis not present

## 2018-03-16 DIAGNOSIS — M9901 Segmental and somatic dysfunction of cervical region: Secondary | ICD-10-CM | POA: Diagnosis not present

## 2018-03-16 DIAGNOSIS — M5134 Other intervertebral disc degeneration, thoracic region: Secondary | ICD-10-CM | POA: Diagnosis not present

## 2018-03-23 DIAGNOSIS — M9902 Segmental and somatic dysfunction of thoracic region: Secondary | ICD-10-CM | POA: Diagnosis not present

## 2018-03-23 DIAGNOSIS — M9901 Segmental and somatic dysfunction of cervical region: Secondary | ICD-10-CM | POA: Diagnosis not present

## 2018-03-23 DIAGNOSIS — M5033 Other cervical disc degeneration, cervicothoracic region: Secondary | ICD-10-CM | POA: Diagnosis not present

## 2018-03-23 DIAGNOSIS — M5134 Other intervertebral disc degeneration, thoracic region: Secondary | ICD-10-CM | POA: Diagnosis not present

## 2018-03-27 DIAGNOSIS — R3912 Poor urinary stream: Secondary | ICD-10-CM | POA: Diagnosis not present

## 2018-03-27 DIAGNOSIS — N5201 Erectile dysfunction due to arterial insufficiency: Secondary | ICD-10-CM | POA: Diagnosis not present

## 2018-03-30 DIAGNOSIS — M5033 Other cervical disc degeneration, cervicothoracic region: Secondary | ICD-10-CM | POA: Diagnosis not present

## 2018-03-30 DIAGNOSIS — M5134 Other intervertebral disc degeneration, thoracic region: Secondary | ICD-10-CM | POA: Diagnosis not present

## 2018-03-30 DIAGNOSIS — M9902 Segmental and somatic dysfunction of thoracic region: Secondary | ICD-10-CM | POA: Diagnosis not present

## 2018-03-30 DIAGNOSIS — M9901 Segmental and somatic dysfunction of cervical region: Secondary | ICD-10-CM | POA: Diagnosis not present

## 2018-04-10 DIAGNOSIS — M9901 Segmental and somatic dysfunction of cervical region: Secondary | ICD-10-CM | POA: Diagnosis not present

## 2018-04-10 DIAGNOSIS — M5033 Other cervical disc degeneration, cervicothoracic region: Secondary | ICD-10-CM | POA: Diagnosis not present

## 2018-04-10 DIAGNOSIS — M9902 Segmental and somatic dysfunction of thoracic region: Secondary | ICD-10-CM | POA: Diagnosis not present

## 2018-04-10 DIAGNOSIS — M5134 Other intervertebral disc degeneration, thoracic region: Secondary | ICD-10-CM | POA: Diagnosis not present

## 2018-04-13 DIAGNOSIS — M9901 Segmental and somatic dysfunction of cervical region: Secondary | ICD-10-CM | POA: Diagnosis not present

## 2018-04-13 DIAGNOSIS — M5134 Other intervertebral disc degeneration, thoracic region: Secondary | ICD-10-CM | POA: Diagnosis not present

## 2018-04-13 DIAGNOSIS — M9902 Segmental and somatic dysfunction of thoracic region: Secondary | ICD-10-CM | POA: Diagnosis not present

## 2018-04-13 DIAGNOSIS — M5033 Other cervical disc degeneration, cervicothoracic region: Secondary | ICD-10-CM | POA: Diagnosis not present

## 2018-05-11 ENCOUNTER — Other Ambulatory Visit: Payer: Self-pay | Admitting: Allergy and Immunology

## 2018-05-11 DIAGNOSIS — J3089 Other allergic rhinitis: Secondary | ICD-10-CM

## 2018-05-11 DIAGNOSIS — J4530 Mild persistent asthma, uncomplicated: Secondary | ICD-10-CM

## 2018-05-12 ENCOUNTER — Telehealth: Payer: Self-pay | Admitting: Allergy and Immunology

## 2018-05-12 DIAGNOSIS — J4530 Mild persistent asthma, uncomplicated: Secondary | ICD-10-CM

## 2018-05-12 DIAGNOSIS — J3089 Other allergic rhinitis: Secondary | ICD-10-CM

## 2018-05-12 MED ORDER — MONTELUKAST SODIUM 10 MG PO TABS: 10.0000 mg | ORAL_TABLET | Freq: Every day | ORAL | 0 refills | Status: DC

## 2018-05-12 NOTE — Telephone Encounter (Signed)
Denied montelukast, pt needs ov. Courtesy refill already given.

## 2018-05-12 NOTE — Telephone Encounter (Signed)
Pt called and needs to have Mont[facility]ukast called into [facility]. [phone].

## 2018-05-12 NOTE — Telephone Encounter (Signed)
Left message advising patient that I had sent in a final courtesy refill but he would need to be seen in office.

## 2018-05-18 DIAGNOSIS — M9903 Segmental and somatic dysfunction of lumbar region: Secondary | ICD-10-CM | POA: Diagnosis not present

## 2018-05-18 DIAGNOSIS — M5136 Other intervertebral disc degeneration, lumbar region: Secondary | ICD-10-CM | POA: Diagnosis not present

## 2018-05-18 DIAGNOSIS — M5134 Other intervertebral disc degeneration, thoracic region: Secondary | ICD-10-CM | POA: Diagnosis not present

## 2018-05-18 DIAGNOSIS — M9902 Segmental and somatic dysfunction of thoracic region: Secondary | ICD-10-CM | POA: Diagnosis not present

## 2018-06-01 DIAGNOSIS — M9903 Segmental and somatic dysfunction of lumbar region: Secondary | ICD-10-CM | POA: Diagnosis not present

## 2018-06-01 DIAGNOSIS — M9902 Segmental and somatic dysfunction of thoracic region: Secondary | ICD-10-CM | POA: Diagnosis not present

## 2018-06-01 DIAGNOSIS — M5136 Other intervertebral disc degeneration, lumbar region: Secondary | ICD-10-CM | POA: Diagnosis not present

## 2018-06-01 DIAGNOSIS — M5134 Other intervertebral disc degeneration, thoracic region: Secondary | ICD-10-CM | POA: Diagnosis not present

## 2018-06-09 DIAGNOSIS — M5033 Other cervical disc degeneration, cervicothoracic region: Secondary | ICD-10-CM | POA: Diagnosis not present

## 2018-06-09 DIAGNOSIS — M5134 Other intervertebral disc degeneration, thoracic region: Secondary | ICD-10-CM | POA: Diagnosis not present

## 2018-06-09 DIAGNOSIS — M9902 Segmental and somatic dysfunction of thoracic region: Secondary | ICD-10-CM | POA: Diagnosis not present

## 2018-06-09 DIAGNOSIS — M9901 Segmental and somatic dysfunction of cervical region: Secondary | ICD-10-CM | POA: Diagnosis not present

## 2018-06-11 ENCOUNTER — Other Ambulatory Visit: Payer: Self-pay | Admitting: Allergy and Immunology

## 2018-06-11 DIAGNOSIS — J3089 Other allergic rhinitis: Secondary | ICD-10-CM

## 2018-06-11 DIAGNOSIS — J4530 Mild persistent asthma, uncomplicated: Secondary | ICD-10-CM

## 2018-06-12 DIAGNOSIS — M25561 Pain in right knee: Secondary | ICD-10-CM | POA: Diagnosis not present

## 2018-06-15 ENCOUNTER — Ambulatory Visit (INDEPENDENT_AMBULATORY_CARE_PROVIDER_SITE_OTHER): Payer: PPO | Admitting: Allergy and Immunology

## 2018-06-15 ENCOUNTER — Encounter: Payer: Self-pay | Admitting: Allergy and Immunology

## 2018-06-15 VITALS — BP 132/70 | HR 80 | Resp 16 | Ht 70.5 in | Wt 189.8 lb

## 2018-06-15 DIAGNOSIS — M5033 Other cervical disc degeneration, cervicothoracic region: Secondary | ICD-10-CM | POA: Diagnosis not present

## 2018-06-15 DIAGNOSIS — J4530 Mild persistent asthma, uncomplicated: Secondary | ICD-10-CM | POA: Diagnosis not present

## 2018-06-15 DIAGNOSIS — J3089 Other allergic rhinitis: Secondary | ICD-10-CM

## 2018-06-15 DIAGNOSIS — R05 Cough: Secondary | ICD-10-CM | POA: Diagnosis not present

## 2018-06-15 DIAGNOSIS — M9901 Segmental and somatic dysfunction of cervical region: Secondary | ICD-10-CM | POA: Diagnosis not present

## 2018-06-15 DIAGNOSIS — M5134 Other intervertebral disc degeneration, thoracic region: Secondary | ICD-10-CM | POA: Diagnosis not present

## 2018-06-15 DIAGNOSIS — R059 Cough, unspecified: Secondary | ICD-10-CM

## 2018-06-15 DIAGNOSIS — R062 Wheezing: Secondary | ICD-10-CM | POA: Diagnosis not present

## 2018-06-15 DIAGNOSIS — M9902 Segmental and somatic dysfunction of thoracic region: Secondary | ICD-10-CM | POA: Diagnosis not present

## 2018-06-15 MED ORDER — FLUTICASONE PROPIONATE 50 MCG/ACT NA SUSP: 1.0000 | Freq: Two times a day (BID) | NASAL | 5 refills | Status: DC | PRN

## 2018-06-15 MED ORDER — ALBUTEROL SULFATE HFA 108 (90 BASE) MCG/ACT IN AERS: 2.0000 | INHALATION_SPRAY | RESPIRATORY_TRACT | 1 refills | Status: DC | PRN

## 2018-06-15 MED ORDER — MONTELUKAST SODIUM 10 MG PO TABS: ORAL_TABLET | ORAL | 5 refills | Status: DC

## 2018-06-15 MED ORDER — AZELASTINE HCL 0.1 % NA SOLN: NASAL | 5 refills | Status: DC

## 2018-06-15 NOTE — Assessment & Plan Note (Signed)
Currently well controlled.  Continue montelukast 10 mg daily at bedtime and albuterol HFA, 1 to 2 inhalations every 6 hours if needed.  A refill prescription has been provided for montelukast.  Subjective and objective measures of pulmonary function will be followed and the treatment plan will be adjusted accordingly.

## 2018-06-15 NOTE — Progress Notes (Signed)
Follow-up Note  RE: [name] [name] MRN: [id number] DOB: 1946/03/06 Date of Office Visit: 06/15/2018  Primary care provider: Thressa Sheller, MD Referring provider: Thressa Sheller, MD  History of present illness: [name] [name] is a 72 y.o. male with persistent asthma and allergic rhinitis presenting today for follow-up.  He was last seen in this clinic in August 2018.  He reports significant upper and lower respiratory symptom improvement while taking montelukast 10 mg daily.  He rarely requires albuterol rescue, denies limitations in normal daily activities, and denies nocturnal awakenings due to lower respiratory symptoms.  He notices increased symptoms if he misses 1 or 2 doses of montelukast.  He experiences occasional nasal symptoms which he attempts to control with cetirizine daily, montelukast, and fluticasone nasal spray if needed.  Reports that he has been taking cetirizine daily for several months.   Assessment and plan: Mild persistent asthma Currently well controlled.  Continue montelukast 10 mg daily at bedtime and albuterol HFA, 1 to 2 inhalations every 6 hours if needed.  A refill prescription has been provided for montelukast.  Subjective and objective measures of pulmonary function will be followed and the treatment plan will be adjusted accordingly.  Perennial allergic rhinitis  Continue appropriate allergen avoidance measures, montelukast 10 mg daily, and fluticasone/azelastine nasal spray if needed.  Nasal saline spray (i.e., Simply Saline) or nasal saline lavage (i.e., NeilMed) is recommended as needed and prior to medicated nasal sprays.  To avoid diminishing benefit with daily use (tachyphylaxis) of second generation antihistamine, consider alternating every few months between fexofenadine (Allegra) and levocetirizine (Xyzal).   Meds ordered this encounter  Medications  . albuterol (PROVENTIL HFA;VENTOLIN HFA) 108 (90 Base) MCG/ACT inhaler    Sig: Inhale 2  puffs into the lungs every 4 (four) hours as needed for wheezing or shortness of breath.    Dispense:  1 Inhaler    Refill:  1  . azelastine (ASTELIN) 0.1 % nasal spray    Sig: 1-2 sprays per nostril 2 times a day as needed.    Dispense:  30 mL    Refill:  5  . fluticasone (FLONASE) 50 MCG/ACT nasal spray    Sig: Place 1 spray into both nostrils 2 (two) times daily as needed for allergies.    Dispense:  16 g    Refill:  5  . montelukast (SINGULAIR) 10 MG tablet    Sig: TAKE 1 TABLET BY MOUTH EVERYDAY AT BEDTIME    Dispense:  30 tablet    Refill:  5    One refill and make appt    Diagnostics: Spirometry reveals an FVC of 3.39 L and an FEV1 of 2.70 L (80% predicted) and an FEV1 ratio of 108%.  Please see scanned spirometry results for details.    Physical examination: Blood pressure 132/70, pulse 80, resp. rate 16, height 5' 10.5" (1.791 m), weight 189 lb 12.8 oz (86.1 kg).  General: Alert, interactive, in no acute distress. HEENT: TMs pearly gray, turbinates mildly edematous with crusty discharge, post-pharynx mildly erythematous. Neck: Supple without lymphadenopathy. Lungs: Clear to auscultation without wheezing, rhonchi or rales. CV: Normal S1, S2 without murmurs. Skin: Warm and dry, without lesions or rashes.  The following portions of the patient's history were reviewed and updated as appropriate: allergies, current medications, past family history, past medical history, past social history, past surgical history and problem list.  Allergies as of 06/15/2018      Reactions   Percocet [oxycodone-acetaminophen] Nausea And Vomiting  Sulfonamide Derivatives Hives      Medication List        Accurate as of 06/15/18  5:22 PM. Always use your most recent med list.          acetaminophen 500 MG tablet Commonly known as:  TYLENOL Take 1,000 mg by mouth at bedtime as needed (pain).   albuterol 108 (90 Base) MCG/ACT inhaler Commonly known as:  PROVENTIL HFA;VENTOLIN  HFA Inhale 2 puffs into the lungs every 4 (four) hours as needed for wheezing or shortness of breath.   azelastine 0.1 % nasal spray Commonly known as:  ASTELIN 1-2 sprays per nostril 2 times a day as needed.   Carbinoxamine Maleate 4 MG Tabs Take 1-2 tablets (4-8 mg total) by mouth 3 (three) times daily as needed.   cetirizine 10 MG tablet Commonly known as:  ZYRTEC Take 10 mg by mouth daily.   diphenhydrAMINE 25 MG tablet Commonly known as:  BENADRYL Take 25 mg by mouth at bedtime as needed for allergies.   fluticasone 50 MCG/ACT nasal spray Commonly known as:  FLONASE Place 1 spray into both nostrils 2 (two) times daily as needed for allergies.   losartan 50 MG tablet Commonly known as:  COZAAR Take 50 mg by mouth at bedtime.   montelukast 10 MG tablet Commonly known as:  SINGULAIR TAKE 1 TABLET BY MOUTH EVERYDAY AT BEDTIME   pantoprazole 40 MG tablet Commonly known as:  PROTONIX TAKE 1 TABLET BY MOUTH EVERY DAY   VITAMIN D PO Take 1 tablet by mouth daily.       Allergies  Allergen Reactions  . Percocet [Oxycodone-Acetaminophen] Nausea And Vomiting  . Sulfonamide Derivatives Hives   Review of systems: Review of systems negative except as noted in HPI / PMHx or noted below: Constitutional: Negative.  HENT: Negative.   Eyes: Negative.  Respiratory: Negative.   Cardiovascular: Negative.  Gastrointestinal: Negative.  Genitourinary: Negative.  Musculoskeletal: Negative.  Neurological: Negative.  Endo/Heme/Allergies: Negative.  Cutaneous: Negative.  Past Medical History:  Diagnosis Date  . Asthma   . Blood transfusion   . Constipation   . Diverticulitis   . Diverticulosis   . Eczema   . Esophageal stricture   . GERD (gastroesophageal reflux disease)   . HBP (high blood pressure)   . Hyperlipemia   . Kidney stones   . Trouble swallowing   . Ulcer     Family History  Problem Relation Age of Onset  . Heart disease Father 42       heart attack   . Colon cancer Neg Hx   . Allergic rhinitis Neg Hx   . Angioedema Neg Hx   . Asthma Neg Hx   . Eczema Neg Hx   . Immunodeficiency Neg Hx   . Urticaria Neg Hx     Social History   Socioeconomic History  . Marital status: Married    Spouse name: Not on file  . Number of children: 2  . Years of education: Not on file  . Highest education level: Not on file  Occupational History  . Occupation: Scientist, clinical (histocompatibility and immunogenetics): [employer]  Social Needs  . Financial resource strain: Not on file  . Food insecurity:    Worry: Not on file    Inability: Not on file  . Transportation needs:    Medical: Not on file    Non-medical: Not on file  Tobacco Use  . Smoking status: Former Smoker    Types: Cigarettes  Last attempt to quit: 11/25/1981    Years since quitting: 36.5  . Smokeless tobacco: Never Used  Substance and Sexual Activity  . Alcohol use: Yes  . Drug use: No  . Sexual activity: Never  Lifestyle  . Physical activity:    Days per week: Not on file    Minutes per session: Not on file  . Stress: Not on file  Relationships  . Social connections:    Talks on phone: Not on file    Gets together: Not on file    Attends religious service: Not on file    Active member of club or organization: Not on file    Attends meetings of clubs or organizations: Not on file    Relationship status: Not on file  . Intimate partner violence:    Fear of current or ex partner: Not on file    Emotionally abused: Not on file    Physically abused: Not on file    Forced sexual activity: Not on file  Other Topics Concern  . Not on file  Social History Narrative   2 daily caffeine drinks    I appreciate the opportunity to take part in Dontavis's care. Please do not hesitate to contact me with questions.  Sincerely,   R. Edgar Frisk, MD

## 2018-06-15 NOTE — Patient Instructions (Signed)
Mild persistent asthma Currently well controlled.  Continue montelukast 10 mg daily at bedtime and albuterol HFA, 1 to 2 inhalations every 6 hours if needed.  A refill prescription has been provided for montelukast.  Subjective and objective measures of pulmonary function will be followed and the treatment plan will be adjusted accordingly.  Perennial allergic rhinitis  Continue appropriate allergen avoidance measures, montelukast 10 mg daily, and fluticasone/azelastine nasal spray if needed.  Nasal saline spray (i.e., Simply Saline) or nasal saline lavage (i.e., NeilMed) is recommended as needed and prior to medicated nasal sprays.  To avoid diminishing benefit with daily use (tachyphylaxis) of second generation antihistamine, consider alternating every few months between fexofenadine (Allegra) and levocetirizine (Xyzal).   Return in about 6 months (around 12/16/2018), or if symptoms worsen or fail to improve.

## 2018-06-15 NOTE — Assessment & Plan Note (Addendum)
   Continue appropriate allergen avoidance measures, montelukast 10 mg daily, and fluticasone/azelastine nasal spray if needed.  Nasal saline spray (i.e., Simply Saline) or nasal saline lavage (i.e., NeilMed) is recommended as needed and prior to medicated nasal sprays.  To avoid diminishing benefit with daily use (tachyphylaxis) of second generation antihistamine, consider alternating every few months between fexofenadine (Allegra) and levocetirizine (Xyzal).

## 2018-06-18 DIAGNOSIS — J0100 Acute maxillary sinusitis, unspecified: Secondary | ICD-10-CM | POA: Diagnosis not present

## 2018-07-03 DIAGNOSIS — M545 Low back pain, unspecified: Secondary | ICD-10-CM | POA: Insufficient documentation

## 2018-07-03 DIAGNOSIS — M25561 Pain in right knee: Secondary | ICD-10-CM | POA: Insufficient documentation

## 2018-07-06 DIAGNOSIS — R5383 Other fatigue: Secondary | ICD-10-CM | POA: Diagnosis not present

## 2018-07-06 DIAGNOSIS — R351 Nocturia: Secondary | ICD-10-CM | POA: Diagnosis not present

## 2018-08-03 IMAGING — CT CT CHEST W/ CM
1 of 2 series · 14 of 32 positions shown, 18 images · IV contrast (APPLIED)
Comparison: 10/28/2016

CLINICAL DATA: Follow-up lung nodule

EXAM:
CT CHEST WITH CONTRAST
TECHNIQUE: Multidetector CT imaging of the chest was performed during
intravenous contrast administration.
CONTRAST:  75mL [id number] IOPAMIDOL ([id number]) INJECTION 61%

[Series 2: chest w/cm · axial · 0.75mm/px · z∈[-154,+110]mm · 14 of 158 slices shown, 18 images]
[im 13/158  mediastinal]
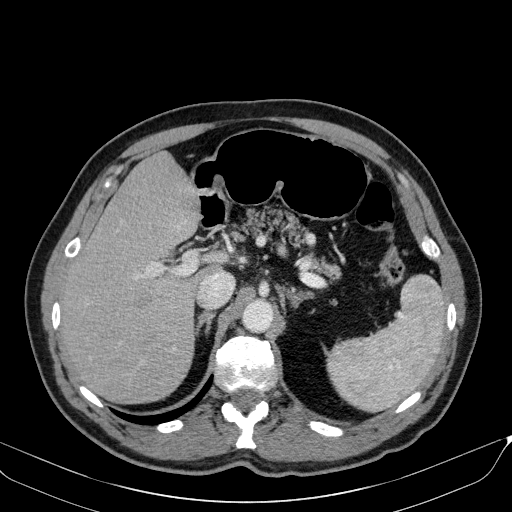
[im 13/158  lung]
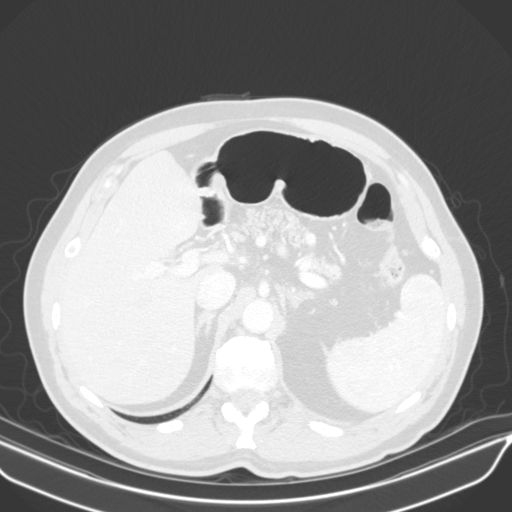
[im 25/158  lung]
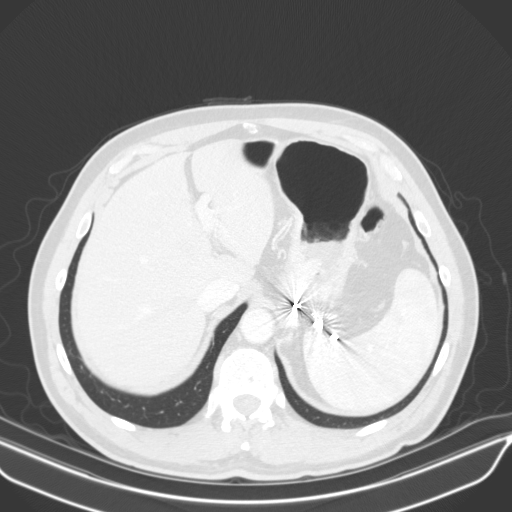
[im 37/158  lung]
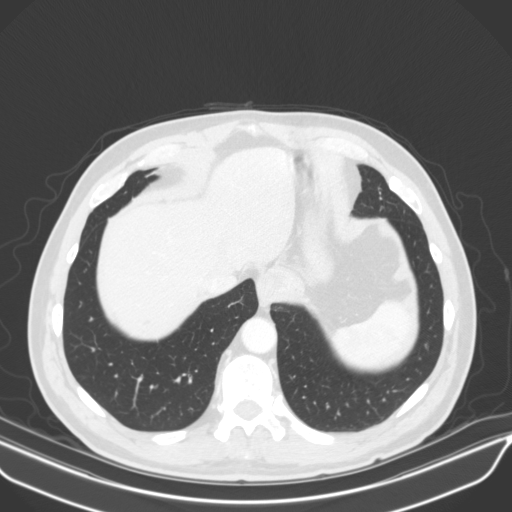
[im 49/158  lung]
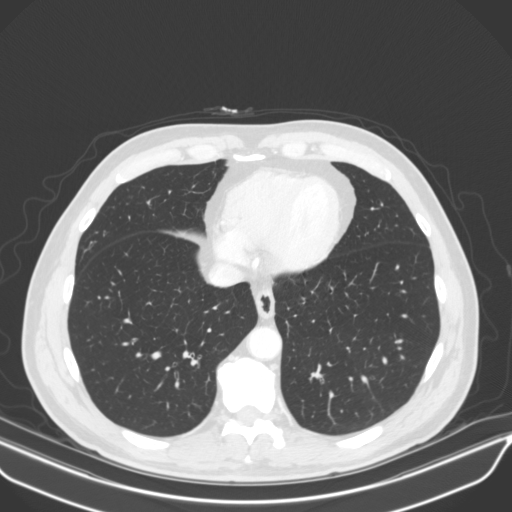
[im 61/158  mediastinal]
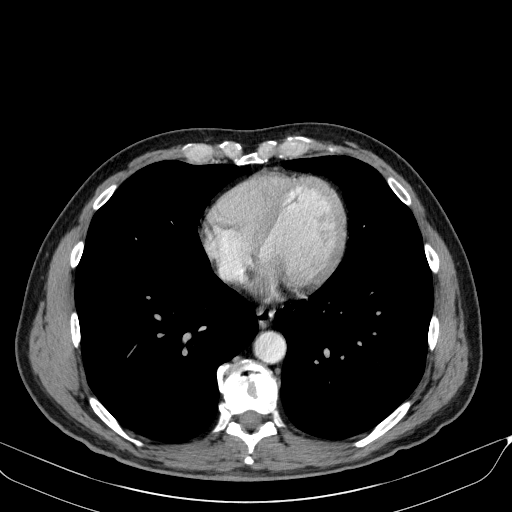
[im 61/158  lung]
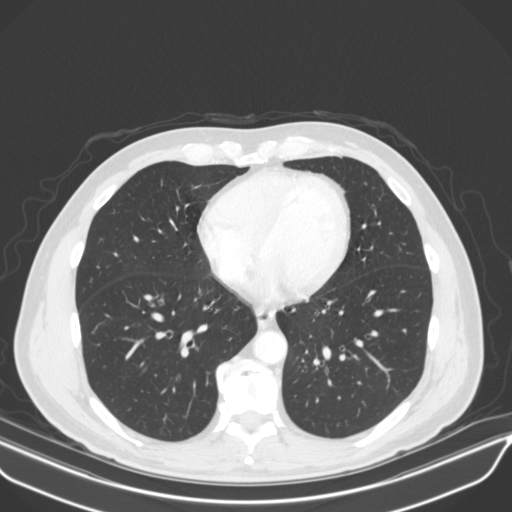
[im 73/158  lung]
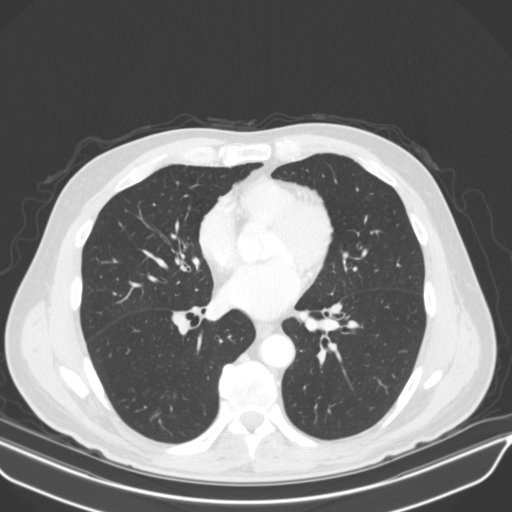
[im 75/158  lung]
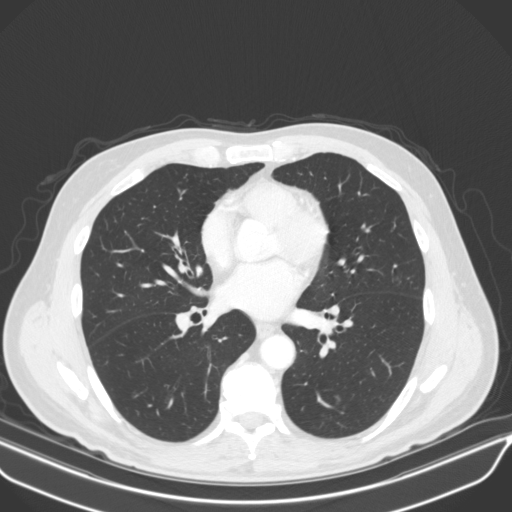
[im 79/158  lung]
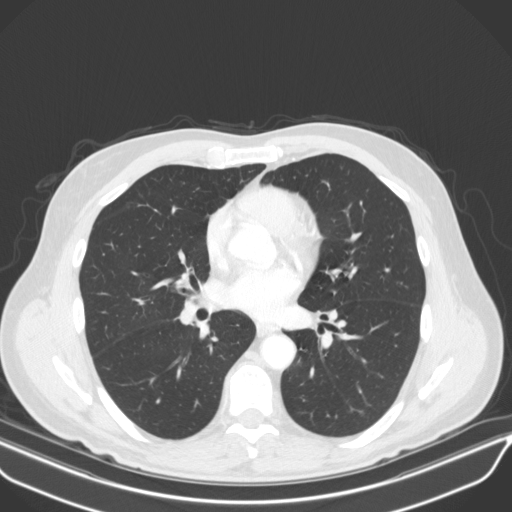
[im 85/158  mediastinal]
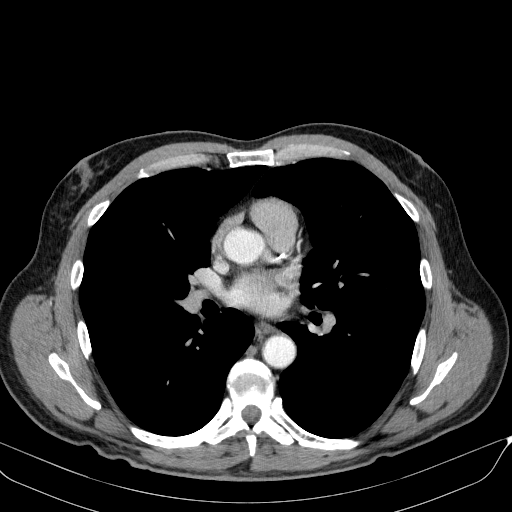
[im 85/158  lung]
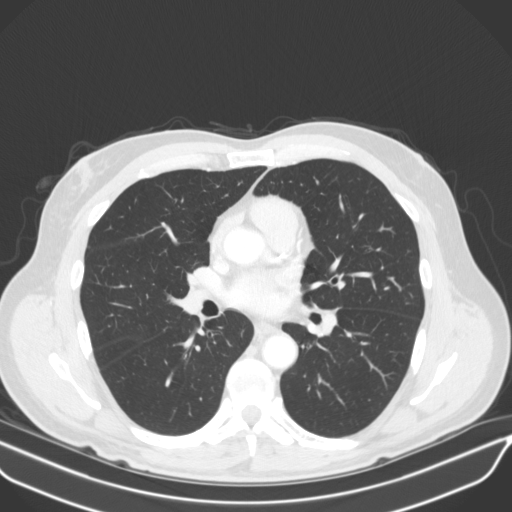
[im 97/158  lung]
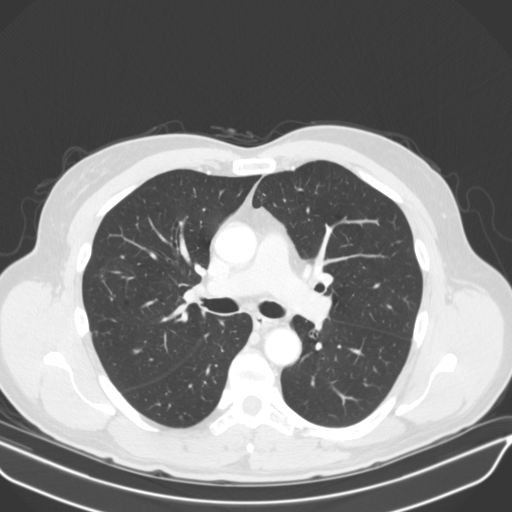
[im 109/158  lung]
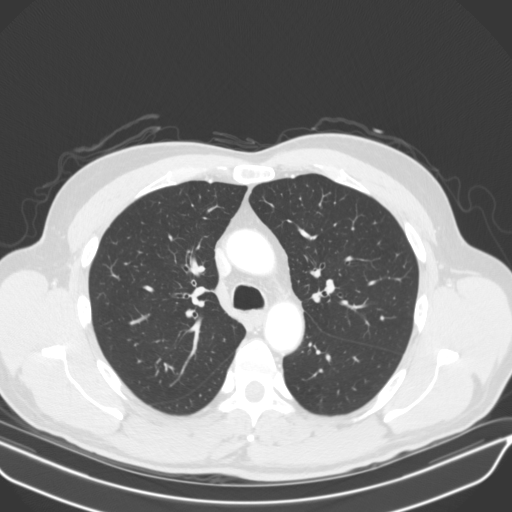
[im 121/158  lung]
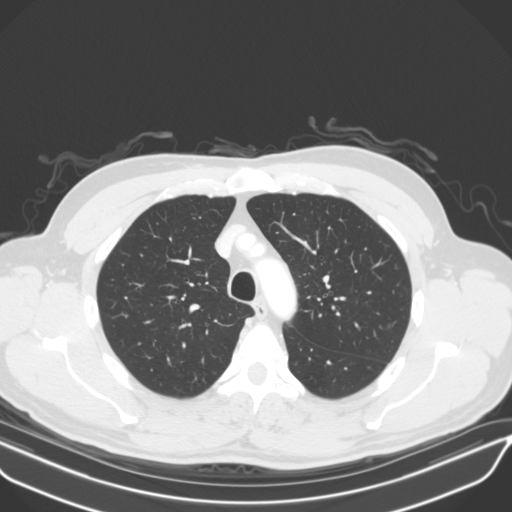
[im 133/158  mediastinal]
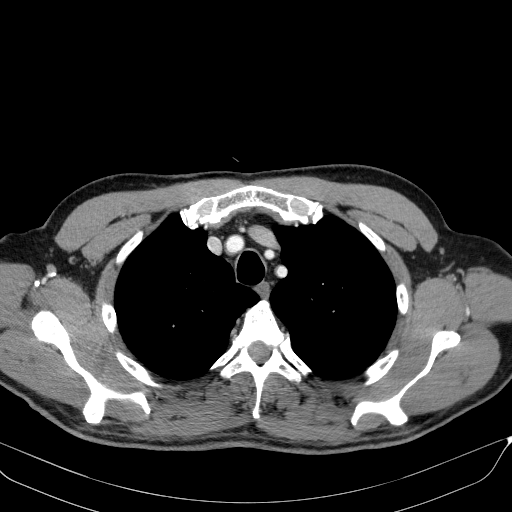
[im 133/158  lung]
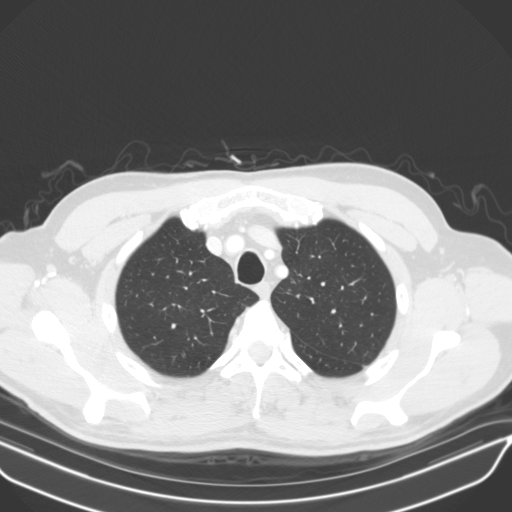
[im 145/158  lung]
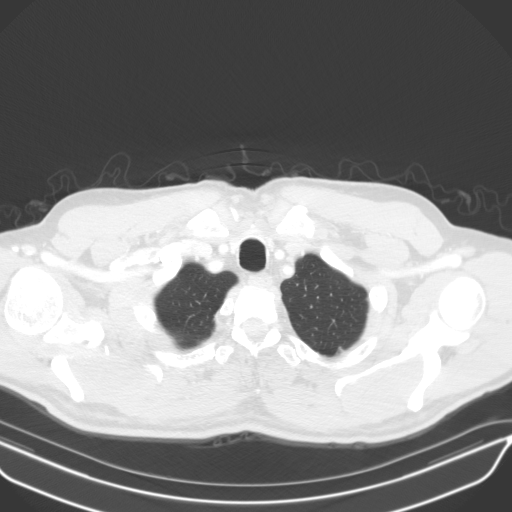

[14 of 32 positions shown; findings below may reference images not displayed]

FINDINGS: Cardiovascular: Heart is normal size. Aorta is normal caliber.
Scattered calcifications in the left anterior descending and
circumflex coronary arteries.

Mediastinum/Nodes: No mediastinal, hilar, or axillary adenopathy.
Small hiatal hernia. Trachea is unremarkable.

Lungs/Pleura: Improvement in the clustered peribronchovascular
nodules since prior study. Slight ground-glass opacities noted in
the lower lobes in the area of prior nodularity. Upper lobe
nodularity also improved. Note new or enlarging pulmonary nodules.
No pleural effusions.

Upper Abdomen: Surgical clips around the proximal stomach. No acute
findings.

Musculoskeletal: Chest wall soft tissues are unremarkable. No acute
bony abnormality.
IMPRESSION: Near complete resolution of the previously seen nodularity in the
lungs. This is compatible with improving inflammatory process. No
new or enlarging pulmonary nodules.

Coronary artery disease.

Hiatal hernia.

## 2018-08-15 ENCOUNTER — Other Ambulatory Visit: Payer: Self-pay | Admitting: Internal Medicine

## 2018-08-17 ENCOUNTER — Other Ambulatory Visit: Payer: Self-pay | Admitting: Allergy and Immunology

## 2018-10-09 DIAGNOSIS — R3912 Poor urinary stream: Secondary | ICD-10-CM | POA: Diagnosis not present

## 2018-10-09 DIAGNOSIS — N5201 Erectile dysfunction due to arterial insufficiency: Secondary | ICD-10-CM | POA: Diagnosis not present

## 2018-10-09 DIAGNOSIS — R351 Nocturia: Secondary | ICD-10-CM | POA: Diagnosis not present

## 2018-12-07 DIAGNOSIS — M9905 Segmental and somatic dysfunction of pelvic region: Secondary | ICD-10-CM | POA: Diagnosis not present

## 2018-12-07 DIAGNOSIS — M9903 Segmental and somatic dysfunction of lumbar region: Secondary | ICD-10-CM | POA: Diagnosis not present

## 2018-12-07 DIAGNOSIS — M9904 Segmental and somatic dysfunction of sacral region: Secondary | ICD-10-CM | POA: Diagnosis not present

## 2018-12-07 DIAGNOSIS — M5136 Other intervertebral disc degeneration, lumbar region: Secondary | ICD-10-CM | POA: Diagnosis not present

## 2018-12-15 ENCOUNTER — Other Ambulatory Visit: Payer: Self-pay | Admitting: Allergy and Immunology

## 2018-12-28 DIAGNOSIS — M5136 Other intervertebral disc degeneration, lumbar region: Secondary | ICD-10-CM | POA: Diagnosis not present

## 2018-12-28 DIAGNOSIS — M9903 Segmental and somatic dysfunction of lumbar region: Secondary | ICD-10-CM | POA: Diagnosis not present

## 2018-12-28 DIAGNOSIS — M9904 Segmental and somatic dysfunction of sacral region: Secondary | ICD-10-CM | POA: Diagnosis not present

## 2018-12-28 DIAGNOSIS — M9905 Segmental and somatic dysfunction of pelvic region: Secondary | ICD-10-CM | POA: Diagnosis not present

## 2019-01-03 ENCOUNTER — Other Ambulatory Visit: Payer: Self-pay | Admitting: Allergy and Immunology

## 2019-01-29 DIAGNOSIS — Z125 Encounter for screening for malignant neoplasm of prostate: Secondary | ICD-10-CM | POA: Diagnosis not present

## 2019-01-29 DIAGNOSIS — E559 Vitamin D deficiency, unspecified: Secondary | ICD-10-CM | POA: Diagnosis not present

## 2019-01-29 DIAGNOSIS — I129 Hypertensive chronic kidney disease with stage 1 through stage 4 chronic kidney disease, or unspecified chronic kidney disease: Secondary | ICD-10-CM | POA: Diagnosis not present

## 2019-02-12 DIAGNOSIS — D485 Neoplasm of uncertain behavior of skin: Secondary | ICD-10-CM | POA: Diagnosis not present

## 2019-02-12 DIAGNOSIS — D2272 Melanocytic nevi of left lower limb, including hip: Secondary | ICD-10-CM | POA: Diagnosis not present

## 2019-02-12 DIAGNOSIS — L7211 Pilar cyst: Secondary | ICD-10-CM | POA: Diagnosis not present

## 2019-02-12 DIAGNOSIS — L309 Dermatitis, unspecified: Secondary | ICD-10-CM | POA: Diagnosis not present

## 2019-02-12 DIAGNOSIS — L821 Other seborrheic keratosis: Secondary | ICD-10-CM | POA: Diagnosis not present

## 2019-02-12 DIAGNOSIS — D225 Melanocytic nevi of trunk: Secondary | ICD-10-CM | POA: Diagnosis not present

## 2019-02-12 DIAGNOSIS — Z23 Encounter for immunization: Secondary | ICD-10-CM | POA: Diagnosis not present

## 2019-02-12 DIAGNOSIS — Z86018 Personal history of other benign neoplasm: Secondary | ICD-10-CM | POA: Diagnosis not present

## 2019-02-19 DIAGNOSIS — Z0000 Encounter for general adult medical examination without abnormal findings: Secondary | ICD-10-CM | POA: Diagnosis not present

## 2019-02-19 DIAGNOSIS — I129 Hypertensive chronic kidney disease with stage 1 through stage 4 chronic kidney disease, or unspecified chronic kidney disease: Secondary | ICD-10-CM | POA: Diagnosis not present

## 2019-02-19 DIAGNOSIS — E559 Vitamin D deficiency, unspecified: Secondary | ICD-10-CM | POA: Diagnosis not present

## 2019-02-19 DIAGNOSIS — R351 Nocturia: Secondary | ICD-10-CM | POA: Diagnosis not present

## 2019-02-19 DIAGNOSIS — R5383 Other fatigue: Secondary | ICD-10-CM | POA: Diagnosis not present

## 2019-02-19 DIAGNOSIS — R05 Cough: Secondary | ICD-10-CM | POA: Diagnosis not present

## 2019-03-16 ENCOUNTER — Other Ambulatory Visit: Payer: Self-pay | Admitting: Allergy and Immunology

## 2019-03-17 ENCOUNTER — Other Ambulatory Visit: Payer: Self-pay | Admitting: Allergy and Immunology

## 2019-03-26 DIAGNOSIS — Z125 Encounter for screening for malignant neoplasm of prostate: Secondary | ICD-10-CM | POA: Diagnosis not present

## 2019-04-09 DIAGNOSIS — R3912 Poor urinary stream: Secondary | ICD-10-CM | POA: Diagnosis not present

## 2019-04-09 DIAGNOSIS — N5201 Erectile dysfunction due to arterial insufficiency: Secondary | ICD-10-CM | POA: Diagnosis not present

## 2019-04-09 DIAGNOSIS — R351 Nocturia: Secondary | ICD-10-CM | POA: Diagnosis not present

## 2019-04-12 DIAGNOSIS — M9904 Segmental and somatic dysfunction of sacral region: Secondary | ICD-10-CM | POA: Diagnosis not present

## 2019-04-12 DIAGNOSIS — M9903 Segmental and somatic dysfunction of lumbar region: Secondary | ICD-10-CM | POA: Diagnosis not present

## 2019-04-12 DIAGNOSIS — M9905 Segmental and somatic dysfunction of pelvic region: Secondary | ICD-10-CM | POA: Diagnosis not present

## 2019-04-12 DIAGNOSIS — M5136 Other intervertebral disc degeneration, lumbar region: Secondary | ICD-10-CM | POA: Diagnosis not present

## 2019-04-15 DIAGNOSIS — M9903 Segmental and somatic dysfunction of lumbar region: Secondary | ICD-10-CM | POA: Diagnosis not present

## 2019-04-15 DIAGNOSIS — M5136 Other intervertebral disc degeneration, lumbar region: Secondary | ICD-10-CM | POA: Diagnosis not present

## 2019-04-15 DIAGNOSIS — M9904 Segmental and somatic dysfunction of sacral region: Secondary | ICD-10-CM | POA: Diagnosis not present

## 2019-04-15 DIAGNOSIS — M9905 Segmental and somatic dysfunction of pelvic region: Secondary | ICD-10-CM | POA: Diagnosis not present

## 2019-04-20 ENCOUNTER — Other Ambulatory Visit: Payer: Self-pay | Admitting: Allergy and Immunology

## 2019-04-20 DIAGNOSIS — M9904 Segmental and somatic dysfunction of sacral region: Secondary | ICD-10-CM | POA: Diagnosis not present

## 2019-04-20 DIAGNOSIS — M5136 Other intervertebral disc degeneration, lumbar region: Secondary | ICD-10-CM | POA: Diagnosis not present

## 2019-04-20 DIAGNOSIS — M9903 Segmental and somatic dysfunction of lumbar region: Secondary | ICD-10-CM | POA: Diagnosis not present

## 2019-04-20 DIAGNOSIS — M9905 Segmental and somatic dysfunction of pelvic region: Secondary | ICD-10-CM | POA: Diagnosis not present

## 2019-04-26 DIAGNOSIS — M5136 Other intervertebral disc degeneration, lumbar region: Secondary | ICD-10-CM | POA: Diagnosis not present

## 2019-04-26 DIAGNOSIS — M9903 Segmental and somatic dysfunction of lumbar region: Secondary | ICD-10-CM | POA: Diagnosis not present

## 2019-04-26 DIAGNOSIS — M9905 Segmental and somatic dysfunction of pelvic region: Secondary | ICD-10-CM | POA: Diagnosis not present

## 2019-04-26 DIAGNOSIS — M9904 Segmental and somatic dysfunction of sacral region: Secondary | ICD-10-CM | POA: Diagnosis not present

## 2019-04-27 ENCOUNTER — Other Ambulatory Visit: Payer: Self-pay | Admitting: *Deleted

## 2019-04-27 MED ORDER — AZELASTINE HCL 0.1 % NA SOLN: NASAL | 0 refills | Status: DC

## 2019-04-29 DIAGNOSIS — H43393 Other vitreous opacities, bilateral: Secondary | ICD-10-CM | POA: Diagnosis not present

## 2019-04-29 DIAGNOSIS — H52223 Regular astigmatism, bilateral: Secondary | ICD-10-CM | POA: Diagnosis not present

## 2019-04-29 DIAGNOSIS — H2513 Age-related nuclear cataract, bilateral: Secondary | ICD-10-CM | POA: Diagnosis not present

## 2019-04-29 DIAGNOSIS — H43813 Vitreous degeneration, bilateral: Secondary | ICD-10-CM | POA: Diagnosis not present

## 2019-04-29 DIAGNOSIS — H5213 Myopia, bilateral: Secondary | ICD-10-CM | POA: Diagnosis not present

## 2019-04-29 DIAGNOSIS — H524 Presbyopia: Secondary | ICD-10-CM | POA: Diagnosis not present

## 2019-05-10 DIAGNOSIS — M9903 Segmental and somatic dysfunction of lumbar region: Secondary | ICD-10-CM | POA: Diagnosis not present

## 2019-05-10 DIAGNOSIS — M9904 Segmental and somatic dysfunction of sacral region: Secondary | ICD-10-CM | POA: Diagnosis not present

## 2019-05-10 DIAGNOSIS — M9905 Segmental and somatic dysfunction of pelvic region: Secondary | ICD-10-CM | POA: Diagnosis not present

## 2019-05-10 DIAGNOSIS — M5136 Other intervertebral disc degeneration, lumbar region: Secondary | ICD-10-CM | POA: Diagnosis not present

## 2019-05-26 ENCOUNTER — Other Ambulatory Visit: Payer: Self-pay | Admitting: Allergy and Immunology

## 2019-06-04 DIAGNOSIS — Z1159 Encounter for screening for other viral diseases: Secondary | ICD-10-CM | POA: Diagnosis not present

## 2019-07-07 ENCOUNTER — Other Ambulatory Visit: Payer: Self-pay | Admitting: Allergy and Immunology

## 2019-07-19 DIAGNOSIS — M9904 Segmental and somatic dysfunction of sacral region: Secondary | ICD-10-CM | POA: Diagnosis not present

## 2019-07-19 DIAGNOSIS — M5136 Other intervertebral disc degeneration, lumbar region: Secondary | ICD-10-CM | POA: Diagnosis not present

## 2019-07-19 DIAGNOSIS — M9905 Segmental and somatic dysfunction of pelvic region: Secondary | ICD-10-CM | POA: Diagnosis not present

## 2019-07-19 DIAGNOSIS — M9903 Segmental and somatic dysfunction of lumbar region: Secondary | ICD-10-CM | POA: Diagnosis not present

## 2019-07-29 NOTE — Progress Notes (Signed)
[place] [address] Dept: [phone]  FOLLOW UP NOTE  Patient ID: [name] [name], male    DOB: 1946-09-10  Age: 73 y.o. MRN: [id number] Date of Office Visit: 07/30/2019  Assessment  Chief Complaint: Asthma  HPI [name] [name] is a 73 year old male who presents to the clinic for a follow up visit. He was last seen in this clinic on 06/15/2018 by Dr. Verlin Fester for evaluation of asthma and allergic rhinitis. He reports that he has been experiencing shortness of breath and some wheezing for the last 3 weeks which is worse while resting and wakes him pu overnight. He reports a cough which is producing clear mucus. He takes montelukast 10 mg once a day and has been out of this medication for a few days. He reports an increase in the frequenty of his albuterol use to 2-3 times a day over the last 3 weeks. Allergic rhinitis is reported as not well controlled with clear rhinorrhea, thick post nasal drainage, and increased thraot clearing. He is currently taking cetirizine, Flonase, and azelastine daily. He does not use a saline rinse at this time. Reflux is reported as well controlled with Protonix. His current medications are listed in the chart.    Drug Allergies:  Allergies  Allergen Reactions  . Percocet [Oxycodone-Acetaminophen] Nausea And Vomiting  . Sulfonamide Derivatives Hives    Physical Exam: BP (!) 140/96   Pulse 79   Temp 98.3 F (36.8 C) (Temporal)   Resp 16   Ht 5' 10.5" (1.791 m)   Wt 185 lb (83.9 kg)   SpO2 95%   BMI 26.17 kg/m    Physical Exam Vitals signs reviewed.  Constitutional:      Appearance: Normal appearance.  HENT:     Head: Normocephalic and atraumatic.     Right Ear: Tympanic membrane normal.     Left Ear: Tympanic membrane normal.     Nose:     Comments: Bilateral nares slightly erythematous with clear nasal drainage noted. Pharynx erythematous with cobblestone apperance. Ears normal. Eyes normal. Eyes:     Conjunctiva/sclera:  Conjunctivae normal.  Neck:     Musculoskeletal: Normal range of motion and neck supple.  Cardiovascular:     Rate and Rhythm: Normal rate and regular rhythm.     Heart sounds: Normal heart sounds. No murmur.  Pulmonary:     Effort: Pulmonary effort is normal.     Comments: Bilateral scattered expiratory wheeze with slight improvement post bronchodilator therapy. Musculoskeletal: Normal range of motion.  Skin:    General: Skin is warm and dry.  Neurological:     Mental Status: He is alert and oriented to person, place, and time.  Psychiatric:        Mood and Affect: Mood normal.        Behavior: Behavior normal.        Thought Content: Thought content normal.        Judgment: Judgment normal.     Diagnostics: FVC 2.80, FEV1 2.34. Predicted FVC 4.54, predicted FEV1 3.33. Spirometry indicates moderate restriction. Past spirometry readings indicate mild restriction.  Post bronchodilator therapy FVC 2.44, FEV1 2.35. Post bronchodilator therapy indicates no significant improvement. Patient unable to stop coughing during testing.  Assessment and Plan: 1. Mild persistent asthma with acute exacerbation   2. Perennial allergic rhinitis   3. Gastroesophageal reflux disease, esophagitis presence not specified     Meds ordered this encounter  Medications  . levocetirizine (XYZAL) 5  MG tablet    Sig: Take 1 tablet by mouth once daily as needed for runny nose    Dispense:  30 tablet    Refill:  5  . azelastine (ASTELIN) 0.1 % nasal spray    Sig: Use in each nostril as directed    Dispense:  30 mL    Refill:  5  . montelukast (SINGULAIR) 10 MG tablet    Sig: Take 1 tablet by mouth once daily    Dispense:  90 tablet    Refill:  1  . albuterol (PROAIR HFA) 108 (90 Base) MCG/ACT inhaler    Sig: Inhale 2 puffs every 4-6 hours as needed for cough, wheeze, shortness of breath or chest tightness    Dispense:  18 g    Refill:  1    Patient Instructions  Asthma Begin Pulmicort Flexhaler  180-2 puffs twice a day for 2 weeks or until you are cough and wheeze free Begin prednisone 10 mg tablets. Take 2 tablets twice a day for 3 days, two tablets once a day for 1 day, 1 tablet on the 5th day, then stop.  Continue montelukast 10 mg once a day to prevent cough and wheeze Continue albuterol 2 puffs every 4 hours as needed for cough or wheeze  Allergic rhinitis Begin Xyzal 5 mg mg once a day as needed for a runny nose. This will replace cetirizine Begin Mucinex [phone] mg once a day to thin mucus. Increase hydration as tolerated.  Continue Flonase 1-2 sprays once a day as needed for a stuffy nose Continue azelastine nasal spray 1-2 sprays in each nostril twice a day as needed for a runny nose or sneezing Consider saline nasal rinses as needed for nasal symptoms. Use this before any medicated nasal sprays for best result  Reflux Continue Protonix as previously prescribed Continue dietary and lifestyle modifications as listed below  Your blood pressure was elevated at today's visit. Follow up with your primary care provider for further evaluation of your blood pressure.  Call the clinic if this treatment plan is not working well for you  Follow up in 2 months or sooner if needed   Return in about 2 months (around 09/29/2019), or if symptoms worsen or fail to improve.    Thank you for the opportunity to care for this patient.  Please do not hesitate to contact me with questions.  Gareth Morgan, FNP [facility]

## 2019-07-30 ENCOUNTER — Encounter: Payer: Self-pay | Admitting: Family Medicine

## 2019-07-30 ENCOUNTER — Other Ambulatory Visit: Payer: Self-pay

## 2019-07-30 ENCOUNTER — Ambulatory Visit (INDEPENDENT_AMBULATORY_CARE_PROVIDER_SITE_OTHER): Payer: PPO | Admitting: Family Medicine

## 2019-07-30 VITALS — BP 140/96 | HR 79 | Temp 98.3°F | Resp 16 | Ht 70.5 in | Wt 185.0 lb

## 2019-07-30 DIAGNOSIS — J3089 Other allergic rhinitis: Secondary | ICD-10-CM

## 2019-07-30 DIAGNOSIS — K219 Gastro-esophageal reflux disease without esophagitis: Secondary | ICD-10-CM | POA: Diagnosis not present

## 2019-07-30 DIAGNOSIS — J4531 Mild persistent asthma with (acute) exacerbation: Secondary | ICD-10-CM | POA: Diagnosis not present

## 2019-07-30 MED ORDER — ALBUTEROL SULFATE HFA 108 (90 BASE) MCG/ACT IN AERS: INHALATION_SPRAY | RESPIRATORY_TRACT | 1 refills | Status: DC

## 2019-07-30 MED ORDER — LEVOCETIRIZINE DIHYDROCHLORIDE 5 MG PO TABS: ORAL_TABLET | ORAL | 5 refills | Status: DC

## 2019-07-30 MED ORDER — MONTELUKAST SODIUM 10 MG PO TABS: ORAL_TABLET | ORAL | 1 refills | Status: DC

## 2019-07-30 MED ORDER — AZELASTINE HCL 0.1 % NA SOLN: NASAL | 5 refills | Status: DC

## 2019-07-30 NOTE — Addendum Note (Signed)
Addended by: Neomia Dear on: 07/30/2019 11:21 AM   Modules accepted: Orders

## 2019-07-30 NOTE — Patient Instructions (Addendum)
Asthma Begin Pulmicort Flexhaler 180-2 puffs twice a day for 2 weeks or until you are cough and wheeze free Begin prednisone 10 mg tablets. Take 2 tablets twice a day for 3 days, two tablets once a day for 1 day, 1 tablet on the 5th day, then stop.  Continue montelukast 10 mg once a day to prevent cough and wheeze Continue albuterol 2 puffs every 4 hours as needed for cough or wheeze  Allergic rhinitis Begin Xyzal 5 mg mg once a day as needed for a runny nose. This will replace cetirizine Begin Mucinex [phone] mg once a day to thin mucus. Increase hydration as tolerated.  Continue Flonase 1-2 sprays once a day as needed for a stuffy nose Continue azelastine nasal spray 1-2 sprays in each nostril twice a day as needed for a runny nose or sneezing Consider saline nasal rinses as needed for nasal symptoms. Use this before any medicated nasal sprays for best result  Reflux Continue Protonix as previously prescribed Continue dietary and lifestyle modifications as listed below  Your blood pressure was elevated at today's visit. Follow up with your primary care provider for further evaluation of your blood pressure.  Call the clinic if this treatment plan is not working well for you  Follow up in 2 months or sooner if needed   Lifestyle Changes for Controlling GERD When you have GERD, stomach acid feels as if it's backing up toward your mouth. Whether or not you take medication to control your GERD, your symptoms can often be improved with lifestyle changes.   Raise Your Head  Reflux is more likely to strike when you're lying down flat, because stomach fluid can  flow backward more easily. Raising the head of your bed 4-6 inches can help. To do this:  Slide blocks or books under the legs at the head of your bed. Or, place a wedge under  the mattress. Many foam stores can make a suitable wedge for you. The wedge  should run from your waist to the top of your head.  Don't just prop  your head on several pillows. This increases pressure on your  stomach. It can make GERD worse.  Watch Your Eating Habits Certain foods may increase the acid in your stomach or relax the lower esophageal sphincter, making GERD more likely. It's best to avoid the following:  Coffee, tea, and carbonated drinks (with and without caffeine)  Fatty, fried, or spicy food  Mint, chocolate, onions, and tomatoes  Any other foods that seem to irritate your stomach or cause you pain  Relieve the Pressure  Eat smaller meals, even if you have to eat more often.  Don't lie down right after you eat. Wait a few hours for your stomach to empty.  Avoid tight belts and tight-fitting clothes.  Lose excess weight.  Tobacco and Alcohol  Avoid smoking tobacco and drinking alcohol. They can make GERD symptoms worse.

## 2019-08-06 DIAGNOSIS — R0989 Other specified symptoms and signs involving the circulatory and respiratory systems: Secondary | ICD-10-CM | POA: Diagnosis not present

## 2019-08-09 DIAGNOSIS — M9901 Segmental and somatic dysfunction of cervical region: Secondary | ICD-10-CM | POA: Diagnosis not present

## 2019-08-09 DIAGNOSIS — M50322 Other cervical disc degeneration at C5-C6 level: Secondary | ICD-10-CM | POA: Diagnosis not present

## 2019-08-09 DIAGNOSIS — M9902 Segmental and somatic dysfunction of thoracic region: Secondary | ICD-10-CM | POA: Diagnosis not present

## 2019-08-09 DIAGNOSIS — M5136 Other intervertebral disc degeneration, lumbar region: Secondary | ICD-10-CM | POA: Diagnosis not present

## 2019-08-16 DIAGNOSIS — M9905 Segmental and somatic dysfunction of pelvic region: Secondary | ICD-10-CM | POA: Diagnosis not present

## 2019-08-16 DIAGNOSIS — M9903 Segmental and somatic dysfunction of lumbar region: Secondary | ICD-10-CM | POA: Diagnosis not present

## 2019-08-16 DIAGNOSIS — M5136 Other intervertebral disc degeneration, lumbar region: Secondary | ICD-10-CM | POA: Diagnosis not present

## 2019-08-16 DIAGNOSIS — M9904 Segmental and somatic dysfunction of sacral region: Secondary | ICD-10-CM | POA: Diagnosis not present

## 2019-08-18 ENCOUNTER — Other Ambulatory Visit (HOSPITAL_COMMUNITY): Payer: Self-pay | Admitting: Internal Medicine

## 2019-08-18 ENCOUNTER — Ambulatory Visit (HOSPITAL_COMMUNITY)
Admission: RE | Admit: 2019-08-18 | Discharge: 2019-08-18 | Disposition: A | Discharge status: Home / Self Care | Payer: PPO | Source: Ambulatory Visit | Attending: Internal Medicine | Admitting: Internal Medicine

## 2019-08-18 ENCOUNTER — Other Ambulatory Visit: Payer: Self-pay | Admitting: Internal Medicine

## 2019-08-18 ENCOUNTER — Other Ambulatory Visit: Payer: Self-pay

## 2019-08-18 DIAGNOSIS — I999 Unspecified disorder of circulatory system: Secondary | ICD-10-CM | POA: Diagnosis not present

## 2019-08-19 ENCOUNTER — Encounter (HOSPITAL_COMMUNITY): Payer: PPO

## 2019-08-23 DIAGNOSIS — M9901 Segmental and somatic dysfunction of cervical region: Secondary | ICD-10-CM | POA: Diagnosis not present

## 2019-08-23 DIAGNOSIS — M50322 Other cervical disc degeneration at C5-C6 level: Secondary | ICD-10-CM | POA: Diagnosis not present

## 2019-08-23 DIAGNOSIS — M5136 Other intervertebral disc degeneration, lumbar region: Secondary | ICD-10-CM | POA: Diagnosis not present

## 2019-08-23 DIAGNOSIS — M9903 Segmental and somatic dysfunction of lumbar region: Secondary | ICD-10-CM | POA: Diagnosis not present

## 2019-08-26 DIAGNOSIS — R42 Dizziness and giddiness: Secondary | ICD-10-CM | POA: Diagnosis not present

## 2019-08-26 DIAGNOSIS — Z20828 Contact with and (suspected) exposure to other viral communicable diseases: Secondary | ICD-10-CM | POA: Diagnosis not present

## 2019-08-26 DIAGNOSIS — R5383 Other fatigue: Secondary | ICD-10-CM | POA: Diagnosis not present

## 2019-08-26 DIAGNOSIS — R110 Nausea: Secondary | ICD-10-CM | POA: Diagnosis not present

## 2019-09-27 ENCOUNTER — Other Ambulatory Visit: Payer: Self-pay | Admitting: Allergy and Immunology

## 2019-09-27 ENCOUNTER — Other Ambulatory Visit: Payer: Self-pay

## 2019-09-27 ENCOUNTER — Ambulatory Visit (INDEPENDENT_AMBULATORY_CARE_PROVIDER_SITE_OTHER): Payer: PPO | Admitting: Allergy and Immunology

## 2019-09-27 ENCOUNTER — Encounter: Payer: Self-pay | Admitting: Allergy and Immunology

## 2019-09-27 VITALS — BP 130/80 | HR 107 | Temp 98.6°F | Resp 18 | Ht 70.0 in

## 2019-09-27 DIAGNOSIS — J4530 Mild persistent asthma, uncomplicated: Secondary | ICD-10-CM

## 2019-09-27 DIAGNOSIS — J3089 Other allergic rhinitis: Secondary | ICD-10-CM | POA: Diagnosis not present

## 2019-09-27 DIAGNOSIS — K219 Gastro-esophageal reflux disease without esophagitis: Secondary | ICD-10-CM | POA: Diagnosis not present

## 2019-09-27 MED ORDER — AZELASTINE HCL 0.1 % NA SOLN: NASAL | 5 refills | Status: DC

## 2019-09-27 MED ORDER — MONTELUKAST SODIUM 10 MG PO TABS: ORAL_TABLET | ORAL | 1 refills | Status: DC

## 2019-09-27 MED ORDER — FAMOTIDINE 20 MG PO TABS: 20.0000 mg | ORAL_TABLET | Freq: Two times a day (BID) | ORAL | 5 refills | Status: DC

## 2019-09-27 MED ORDER — FLUTICASONE PROPIONATE 50 MCG/ACT NA SUSP: 2.0000 | Freq: Every day | NASAL | 0 refills | Status: DC

## 2019-09-27 MED ORDER — PULMICORT FLEXHALER 180 MCG/ACT IN AEPB: 2.0000 | INHALATION_SPRAY | Freq: Two times a day (BID) | RESPIRATORY_TRACT | 5 refills | Status: DC

## 2019-09-27 MED ORDER — ALBUTEROL SULFATE HFA 108 (90 BASE) MCG/ACT IN AERS: INHALATION_SPRAY | RESPIRATORY_TRACT | 2 refills | Status: DC

## 2019-09-27 NOTE — Patient Instructions (Addendum)
Mild persistent asthma Currently well controlled.  Spirometry today reveals a severe restrictive pattern.  The patient has had mild restrictive patterns on spirometry in the past and has had full reversibility in the past.  Today's reading may have been secondary to effort or the new COVID-19 filtered mouthpieces for the spirometer.  Continue montelukast 10 mg daily at bedtime and albuterol HFA, 1 to 2 inhalations every 4-6 hours if needed.  During upper respiratory tract infections or asthma flares, add Pulmicort 180 g, 2 inhalations twice daily until symptoms have returned to baseline.  If the patient develops new symptoms and/or the restrictive pattern progresses, we will refer to pulmonology for further evaluation.  Perennial allergic rhinitis Stable.  Continue appropriate allergen avoidance measures, montelukast 10 mg daily, and fluticasone/azelastine nasal spray if needed.  Nasal saline spray (i.e., Simply Saline) or nasal saline lavage (i.e., NeilMed) is recommended as needed and prior to medicated nasal sprays.  GERD (gastroesophageal reflux disease)  Appropriate reflux lifestyle modifications have been provided.  Famotidine (Pepcid) 20 mg twice daily if needed.   Return in about 4-5 months, or if symptoms worsen or fail to improve.  Lifestyle Changes for Controlling GERD  When you have GERD, stomach acid feels as if it's backing up toward your mouth. Whether or not you take medication to control your GERD, your symptoms can often be improved with lifestyle changes.   Raise Your Head  Reflux is more likely to strike when you're lying down flat, because stomach fluid can  flow backward more easily. Raising the head of your bed 4-6 inches can help. To do this:  Slide blocks or books under the legs at the head of your bed. Or, place a wedge under  the mattress. Many foam stores can make a suitable wedge for you. The wedge  should run from your waist to the top of your  head.  Don't just prop your head on several pillows. This increases pressure on your  stomach. It can make GERD worse.  Watch Your Eating Habits Certain foods may increase the acid in your stomach or relax the lower esophageal sphincter, making GERD more likely. It's best to avoid the following:  Coffee, tea, and carbonated drinks (with and without caffeine)  Fatty, fried, or spicy food  Mint, chocolate, onions, and tomatoes  Any other foods that seem to irritate your stomach or cause you pain  Relieve the Pressure  Eat smaller meals, even if you have to eat more often.  Don't lie down right after you eat. Wait a few hours for your stomach to empty.  Avoid tight belts and tight-fitting clothes.  Lose excess weight.  Tobacco and Alcohol  Avoid smoking tobacco and drinking alcohol. They can make GERD symptoms worse.

## 2019-09-27 NOTE — Assessment & Plan Note (Addendum)
Currently well controlled.  Spirometry today reveals a severe restrictive pattern.  The patient has had mild restrictive patterns on spirometry in the past and has had full reversibility in the past.  Today's reading may have been secondary to effort or the new COVID-19 filtered mouthpieces for the spirometer.  Continue montelukast 10 mg daily at bedtime and albuterol HFA, 1 to 2 inhalations every 4-6 hours if needed.  During upper respiratory tract infections or asthma flares, add Pulmicort 180 g, 2 inhalations twice daily until symptoms have returned to baseline.  If the patient develops new symptoms and/or the restrictive pattern progresses, we will refer to pulmonology for further evaluation.

## 2019-09-27 NOTE — Assessment & Plan Note (Signed)
   Appropriate reflux lifestyle modifications have been provided.  Famotidine (Pepcid) 20 mg twice daily if needed. 

## 2019-09-27 NOTE — Progress Notes (Signed)
Follow-up Note  RE: [name] [name] MRN: [id number] DOB: Dec 25, 1945 Date of Office Visit: 09/27/2019  Primary care provider: Thressa Sheller, MD (Inactive) Referring provider: No ref. provider found  History of present illness: [name] [name] is a 73 y.o. male with persistent asthma, allergic rhinitis, and gastroesophageal reflux presenting today for follow-up.  He was last seen in this clinic on July 30, 2019.  He reports that in the interval since his previous visit his asthma has been well controlled.  He rarely requires albuterol rescue and does not experience limitations in normal daily activities or nocturnal awakenings due to lower respiratory symptoms.  In early September he had had an asthma exacerbation which was treated with a course of prednisone and burst therapy with Pulmicort 180 g, 2 inhalations twice daily for 2 weeks.  He reports that the Pulmicort "helped out tremendously."  He reports his asthma as currently well controlled with montelukast 10 mg daily.  His nasal allergy symptoms are well controlled.  He reports that his acid reflux is "not great."  He is currently not taking an antacid because he is interested in limiting his medications.  Assessment and plan: Mild persistent asthma Currently well controlled.  Spirometry today reveals a severe restrictive pattern.  The patient has had mild restrictive patterns on spirometry in the past and has had full reversibility in the past.  Today's reading may have been secondary to effort or the new COVID-19 filtered mouthpieces for the spirometer.  Continue montelukast 10 mg daily at bedtime and albuterol HFA, 1 to 2 inhalations every 4-6 hours if needed.  During upper respiratory tract infections or asthma flares, add Pulmicort 180 g, 2 inhalations twice daily until symptoms have returned to baseline.  If the patient develops new symptoms and/or the restrictive pattern progresses, we will refer to pulmonology for further  evaluation.  Perennial allergic rhinitis Stable.  Continue appropriate allergen avoidance measures, montelukast 10 mg daily, and fluticasone/azelastine nasal spray if needed.  Nasal saline spray (i.e., Simply Saline) or nasal saline lavage (i.e., NeilMed) is recommended as needed and prior to medicated nasal sprays.  GERD (gastroesophageal reflux disease)  Appropriate reflux lifestyle modifications have been provided.  Famotidine (Pepcid) 20 mg twice daily if needed.   Meds ordered this encounter  Medications  . montelukast (SINGULAIR) 10 MG tablet    Sig: Take 1 tablet daily at bedtime.    Dispense:  90 tablet    Refill:  1  . albuterol (PROAIR HFA) 108 (90 Base) MCG/ACT inhaler    Sig: Inhale 1-2 puffs every 6 hours as needed for cough or wheeze.    Dispense:  18 g    Refill:  2  . budesonide (PULMICORT FLEXHALER) 180 MCG/ACT inhaler    Sig: Inhale 2 puffs into the lungs 2 (two) times daily.    Dispense:  1 each    Refill:  5  . famotidine (PEPCID) 20 MG tablet    Sig: Take 1 tablet (20 mg total) by mouth 2 (two) times daily. As needed.    Dispense:  60 tablet    Refill:  5  . azelastine (ASTELIN) 0.1 % nasal spray    Sig: Use 2 sprays twice daily as needed.    Dispense:  30 mL    Refill:  5  . fluticasone (FLONASE) 50 MCG/ACT nasal spray    Sig: Place 2 sprays into both nostrils daily. As needed.    Dispense:  48 g    Refill:  0    Diagnostics: Spirometry reveals an FVC of 1.91 L (43% predicted) and an FEV1 of 1.69 L (52% predicted) with an FEV1 ratio of 121%.  Restrictive pattern.  Please see scanned spirometry results for details.    Physical examination: Blood pressure 130/80, pulse (!) 107, temperature 98.6 F (37 C), temperature source Temporal, resp. rate 18, height 5\' 10"  (1.778 m), SpO2 97 %.  General: Alert, interactive, in no acute distress. HEENT: TMs pearly gray, turbinates mildly edematous without discharge, post-pharynx mildly erythematous. Neck:  Supple without lymphadenopathy. Lungs: Clear to auscultation without wheezing, rhonchi or rales. CV: Normal S1, S2 without murmurs. Skin: Warm and dry, without lesions or rashes.  The following portions of the patient's history were reviewed and updated as appropriate: allergies, current medications, past family history, past medical history, past social history, past surgical history and problem list.  Current Outpatient Medications  Medication Sig Dispense Refill  . acetaminophen (TYLENOL) 500 MG tablet Take 1,000 mg by mouth at bedtime as needed (pain).    [name] azelastine (ASTELIN) 0.1 % nasal spray Use 2 sprays twice daily as needed. 30 mL 5  . cetirizine (ZYRTEC) 10 MG tablet Take 10 mg by mouth daily.    . Cholecalciferol (VITAMIN D PO) Take 1 tablet by mouth daily.    . diphenhydrAMINE (BENADRYL) 25 MG tablet Take 25 mg by mouth at bedtime as needed for allergies.    . fluticasone (FLONASE) 50 MCG/ACT nasal spray Place 2 sprays into both nostrils daily. As needed. 48 g 0  . levocetirizine (XYZAL) 5 MG tablet Take 1 tablet by mouth once daily as needed for runny nose 30 tablet 5  . losartan (COZAAR) 50 MG tablet Take 50 mg by mouth at bedtime.     . montelukast (SINGULAIR) 10 MG tablet Take 1 tablet daily at bedtime. 90 tablet 1  . pantoprazole (PROTONIX) 40 MG tablet TAKE 1 TABLET BY MOUTH EVERY DAY 30 tablet 6  . albuterol (PROAIR HFA) 108 (90 Base) MCG/ACT inhaler Inhale 1-2 puffs every 6 hours as needed for cough or wheeze. 18 g 2  . budesonide (PULMICORT FLEXHALER) 180 MCG/ACT inhaler Inhale 2 puffs into the lungs 2 (two) times daily. 1 each 5  . famotidine (PEPCID) 20 MG tablet Take 1 tablet (20 mg total) by mouth 2 (two) times daily. As needed. 60 tablet 5   No current facility-administered medications for this visit.     Allergies  Allergen Reactions  . Percocet [Oxycodone-Acetaminophen] Nausea And Vomiting  . Sulfonamide Derivatives Hives   Review of systems: Review of  systems negative except as noted in HPI / PMHx or noted below: Constitutional: Negative.  HENT: Negative.   Eyes: Negative.  Respiratory: Negative.   Cardiovascular: Negative.  Gastrointestinal: Negative.  Genitourinary: Negative.  Musculoskeletal: Negative.  Neurological: Negative.  Endo/Heme/Allergies: Negative.  Cutaneous: Negative.  Past Medical History:  Diagnosis Date  . Asthma   . Blood transfusion   . Constipation   . Diverticulitis   . Diverticulosis   . Eczema   . Esophageal stricture   . GERD (gastroesophageal reflux disease)   . HBP (high blood pressure)   . Hyperlipemia   . Kidney stones   . Trouble swallowing   . Ulcer     Family History  Problem Relation Age of Onset  . Heart disease Father 46       heart attack  . Colon cancer Neg Hx   . Allergic rhinitis Neg Hx   . Angioedema  Neg Hx   . Asthma Neg Hx   . Eczema Neg Hx   . Immunodeficiency Neg Hx   . Urticaria Neg Hx     Social History   Socioeconomic History  . Marital status: Married    Spouse name: Not on file  . Number of children: 2  . Years of education: Not on file  . Highest education level: Not on file  Occupational History  . Occupation: Scientist, clinical (histocompatibility and immunogenetics): [employer]  Social Needs  . Financial resource strain: Not on file  . Food insecurity    Worry: Not on file    Inability: Not on file  . Transportation needs    Medical: Not on file    Non-medical: Not on file  Tobacco Use  . Smoking status: Former Smoker    Types: Cigarettes    Quit date: 11/25/1981    Years since quitting: 37.8  . Smokeless tobacco: Never Used  Substance and Sexual Activity  . Alcohol use: Yes  . Drug use: No  . Sexual activity: Never  Lifestyle  . Physical activity    Days per week: Not on file    Minutes per session: Not on file  . Stress: Not on file  Relationships  . Social Herbalist on phone: Not on file    Gets together: Not on file    Attends religious service:  Not on file    Active member of club or organization: Not on file    Attends meetings of clubs or organizations: Not on file    Relationship status: Not on file  . Intimate partner violence    Fear of current or ex partner: Not on file    Emotionally abused: Not on file    Physically abused: Not on file    Forced sexual activity: Not on file  Other Topics Concern  . Not on file  Social History Narrative   2 daily caffeine drinks     I appreciate the opportunity to take part in [facility]'s care. Please do not hesitate to contact me with questions.  Sincerely,   R. Edgar Frisk, MD

## 2019-09-27 NOTE — Assessment & Plan Note (Signed)
Stable.  Continue appropriate allergen avoidance measures, montelukast 10 mg daily, and fluticasone/azelastine nasal spray if needed.  Nasal saline spray (i.e., Simply Saline) or nasal saline lavage (i.e., NeilMed) is recommended as needed and prior to medicated nasal sprays. 

## 2019-10-07 DIAGNOSIS — R110 Nausea: Secondary | ICD-10-CM | POA: Diagnosis not present

## 2019-10-07 DIAGNOSIS — Z20828 Contact with and (suspected) exposure to other viral communicable diseases: Secondary | ICD-10-CM | POA: Diagnosis not present

## 2019-10-07 DIAGNOSIS — R52 Pain, unspecified: Secondary | ICD-10-CM | POA: Diagnosis not present

## 2019-10-07 DIAGNOSIS — R05 Cough: Secondary | ICD-10-CM | POA: Diagnosis not present

## 2019-10-07 DIAGNOSIS — R5383 Other fatigue: Secondary | ICD-10-CM | POA: Diagnosis not present

## 2019-10-07 DIAGNOSIS — R519 Headache, unspecified: Secondary | ICD-10-CM | POA: Diagnosis not present

## 2019-10-25 DIAGNOSIS — M50322 Other cervical disc degeneration at C5-C6 level: Secondary | ICD-10-CM | POA: Diagnosis not present

## 2019-10-25 DIAGNOSIS — M9901 Segmental and somatic dysfunction of cervical region: Secondary | ICD-10-CM | POA: Diagnosis not present

## 2019-10-25 DIAGNOSIS — M5136 Other intervertebral disc degeneration, lumbar region: Secondary | ICD-10-CM | POA: Diagnosis not present

## 2019-10-25 DIAGNOSIS — M9903 Segmental and somatic dysfunction of lumbar region: Secondary | ICD-10-CM | POA: Diagnosis not present

## 2019-12-27 ENCOUNTER — Other Ambulatory Visit: Payer: Self-pay | Admitting: Allergy and Immunology

## 2020-01-24 DIAGNOSIS — M9901 Segmental and somatic dysfunction of cervical region: Secondary | ICD-10-CM | POA: Diagnosis not present

## 2020-01-24 DIAGNOSIS — M5136 Other intervertebral disc degeneration, lumbar region: Secondary | ICD-10-CM | POA: Diagnosis not present

## 2020-01-24 DIAGNOSIS — M9903 Segmental and somatic dysfunction of lumbar region: Secondary | ICD-10-CM | POA: Diagnosis not present

## 2020-01-24 DIAGNOSIS — M50322 Other cervical disc degeneration at C5-C6 level: Secondary | ICD-10-CM | POA: Diagnosis not present

## 2020-02-14 DIAGNOSIS — N390 Urinary tract infection, site not specified: Secondary | ICD-10-CM | POA: Diagnosis not present

## 2020-02-14 DIAGNOSIS — Z125 Encounter for screening for malignant neoplasm of prostate: Secondary | ICD-10-CM | POA: Diagnosis not present

## 2020-02-14 DIAGNOSIS — E559 Vitamin D deficiency, unspecified: Secondary | ICD-10-CM | POA: Diagnosis not present

## 2020-02-14 DIAGNOSIS — R351 Nocturia: Secondary | ICD-10-CM | POA: Diagnosis not present

## 2020-02-14 DIAGNOSIS — E785 Hyperlipidemia, unspecified: Secondary | ICD-10-CM | POA: Diagnosis not present

## 2020-02-14 DIAGNOSIS — R5383 Other fatigue: Secondary | ICD-10-CM | POA: Diagnosis not present

## 2020-02-14 DIAGNOSIS — I129 Hypertensive chronic kidney disease with stage 1 through stage 4 chronic kidney disease, or unspecified chronic kidney disease: Secondary | ICD-10-CM | POA: Diagnosis not present

## 2020-02-14 DIAGNOSIS — Z0000 Encounter for general adult medical examination without abnormal findings: Secondary | ICD-10-CM | POA: Diagnosis not present

## 2020-02-18 DIAGNOSIS — L821 Other seborrheic keratosis: Secondary | ICD-10-CM | POA: Diagnosis not present

## 2020-02-18 DIAGNOSIS — D485 Neoplasm of uncertain behavior of skin: Secondary | ICD-10-CM | POA: Diagnosis not present

## 2020-02-18 DIAGNOSIS — Z86018 Personal history of other benign neoplasm: Secondary | ICD-10-CM | POA: Diagnosis not present

## 2020-02-18 DIAGNOSIS — D2272 Melanocytic nevi of left lower limb, including hip: Secondary | ICD-10-CM | POA: Diagnosis not present

## 2020-02-18 DIAGNOSIS — D2261 Melanocytic nevi of right upper limb, including shoulder: Secondary | ICD-10-CM | POA: Diagnosis not present

## 2020-02-18 DIAGNOSIS — L309 Dermatitis, unspecified: Secondary | ICD-10-CM | POA: Diagnosis not present

## 2020-02-18 DIAGNOSIS — L7211 Pilar cyst: Secondary | ICD-10-CM | POA: Diagnosis not present

## 2020-02-18 DIAGNOSIS — Z23 Encounter for immunization: Secondary | ICD-10-CM | POA: Diagnosis not present

## 2020-02-18 DIAGNOSIS — D3610 Benign neoplasm of peripheral nerves and autonomic nervous system, unspecified: Secondary | ICD-10-CM | POA: Diagnosis not present

## 2020-02-18 DIAGNOSIS — L738 Other specified follicular disorders: Secondary | ICD-10-CM | POA: Diagnosis not present

## 2020-02-18 DIAGNOSIS — D225 Melanocytic nevi of trunk: Secondary | ICD-10-CM | POA: Diagnosis not present

## 2020-02-18 DIAGNOSIS — L300 Nummular dermatitis: Secondary | ICD-10-CM | POA: Diagnosis not present

## 2020-02-18 DIAGNOSIS — L578 Other skin changes due to chronic exposure to nonionizing radiation: Secondary | ICD-10-CM | POA: Diagnosis not present

## 2020-02-28 ENCOUNTER — Encounter: Payer: Self-pay | Admitting: Allergy and Immunology

## 2020-02-28 ENCOUNTER — Ambulatory Visit: Payer: PPO | Admitting: Allergy and Immunology

## 2020-02-28 ENCOUNTER — Other Ambulatory Visit: Payer: Self-pay

## 2020-02-28 VITALS — BP 124/64 | HR 66 | Temp 97.8°F | Resp 16 | Ht 69.5 in | Wt 185.0 lb

## 2020-02-28 DIAGNOSIS — J302 Other seasonal allergic rhinitis: Secondary | ICD-10-CM | POA: Diagnosis not present

## 2020-02-28 DIAGNOSIS — K219 Gastro-esophageal reflux disease without esophagitis: Secondary | ICD-10-CM | POA: Diagnosis not present

## 2020-02-28 DIAGNOSIS — I129 Hypertensive chronic kidney disease with stage 1 through stage 4 chronic kidney disease, or unspecified chronic kidney disease: Secondary | ICD-10-CM | POA: Diagnosis not present

## 2020-02-28 DIAGNOSIS — J3089 Other allergic rhinitis: Secondary | ICD-10-CM

## 2020-02-28 DIAGNOSIS — N529 Male erectile dysfunction, unspecified: Secondary | ICD-10-CM | POA: Diagnosis not present

## 2020-02-28 DIAGNOSIS — N182 Chronic kidney disease, stage 2 (mild): Secondary | ICD-10-CM | POA: Diagnosis not present

## 2020-02-28 DIAGNOSIS — E559 Vitamin D deficiency, unspecified: Secondary | ICD-10-CM | POA: Diagnosis not present

## 2020-02-28 DIAGNOSIS — Z87442 Personal history of urinary calculi: Secondary | ICD-10-CM | POA: Diagnosis not present

## 2020-02-28 DIAGNOSIS — E785 Hyperlipidemia, unspecified: Secondary | ICD-10-CM | POA: Diagnosis not present

## 2020-02-28 DIAGNOSIS — J4530 Mild persistent asthma, uncomplicated: Secondary | ICD-10-CM

## 2020-02-28 DIAGNOSIS — Z0000 Encounter for general adult medical examination without abnormal findings: Secondary | ICD-10-CM | POA: Diagnosis not present

## 2020-02-28 DIAGNOSIS — Z125 Encounter for screening for malignant neoplasm of prostate: Secondary | ICD-10-CM | POA: Diagnosis not present

## 2020-02-28 DIAGNOSIS — F17211 Nicotine dependence, cigarettes, in remission: Secondary | ICD-10-CM | POA: Diagnosis not present

## 2020-02-28 MED ORDER — FLOVENT HFA 110 MCG/ACT IN AERO: 2.0000 | INHALATION_SPRAY | Freq: Two times a day (BID) | RESPIRATORY_TRACT | 5 refills | Status: DC

## 2020-02-28 MED ORDER — MONTELUKAST SODIUM 10 MG PO TABS: ORAL_TABLET | ORAL | 1 refills | Status: DC

## 2020-02-28 MED ORDER — LEVOCETIRIZINE DIHYDROCHLORIDE 5 MG PO TABS: ORAL_TABLET | ORAL | 5 refills | Status: DC

## 2020-02-28 NOTE — Assessment & Plan Note (Signed)
Currently well controlled.   Continue montelukast 10 mg daily at bedtime and albuterol HFA, 1 to 2 inhalations every 4-6 hours if needed.  During upper respiratory tract infections or asthma flares, add Flovent 110 g, 2 inhalations twice daily until symptoms have returned to baseline.  To maximize pulmonary deposition, a spacer has been provided along with instructions for its proper administration with an HFA inhaler.  Subjective and objective measures of pulmonary function will be followed and the treatment plan will be adjusted accordingly.

## 2020-02-28 NOTE — Patient Instructions (Addendum)
Mild persistent asthma Currently well controlled.   Continue montelukast 10 mg daily at bedtime and albuterol HFA, 1 to 2 inhalations every 4-6 hours if needed.  During upper respiratory tract infections or asthma flares, add Flovent 110 g, 2 inhalations twice daily until symptoms have returned to baseline.  To maximize pulmonary deposition, a spacer has been provided along with instructions for its proper administration with an HFA inhaler.  Subjective and objective measures of pulmonary function will be followed and the treatment plan will be adjusted accordingly.  Perennial allergic rhinitis Stable.  Continue appropriate allergen avoidance measures, montelukast 10 mg daily, and fluticasone/azelastine nasal spray if needed.  Nasal saline spray (i.e., Simply Saline) or nasal saline lavage (i.e., NeilMed) is recommended as needed and prior to medicated nasal sprays.  GERD (gastroesophageal reflux disease)  Appropriate reflux lifestyle modifications have been provided.  Famotidine (Pepcid) 20 mg twice daily if needed.   Return in about 5 months (around 07/30/2020), or if symptoms worsen or fail to improve.

## 2020-02-28 NOTE — Assessment & Plan Note (Signed)
Stable.  Continue appropriate allergen avoidance measures, montelukast 10 mg daily, and fluticasone/azelastine nasal spray if needed.  Nasal saline spray (i.e., Simply Saline) or nasal saline lavage (i.e., NeilMed) is recommended as needed and prior to medicated nasal sprays.

## 2020-02-28 NOTE — Assessment & Plan Note (Signed)
   Appropriate reflux lifestyle modifications have been provided.  Famotidine (Pepcid) 20 mg twice daily if needed.

## 2020-02-28 NOTE — Progress Notes (Signed)
Follow-up Note  RE: [name] [name] MRN: [id number] DOB: 1945-12-23 Date of Office Visit: 02/28/2020  Primary care provider: Vassie Moment, NP Referring provider: No ref. provider found  History of present illness: [name] [name] is a 74 y.o. male with persistent asthma, allergic rhinitis, and esophageal reflux presenting today for follow-up.  He was last seen in this clinic in November 2020.  He reports that his asthma has been well controlled and that he has required albuterol rescue 2 times over the past 4 to 6 weeks.  He has not been experiencing limitations in normal daily activities or nocturnal awakenings due to lower respiratory symptoms.  He is taking montelukast 10 mg daily at bedtime and has Flovent on hand for burst therapy if needed.  He reports that his nasal allergy symptoms have been well controlled with azelastine nasal spray and fluticasone nasal spray as needed, typically a few times per week.  He needs prescription refills.  Assessment and plan: Mild persistent asthma Currently well controlled.   Continue montelukast 10 mg daily at bedtime and albuterol HFA, 1 to 2 inhalations every 4-6 hours if needed.  During upper respiratory tract infections or asthma flares, add Flovent 110 g, 2 inhalations twice daily until symptoms have returned to baseline.  To maximize pulmonary deposition, a spacer has been provided along with instructions for its proper administration with an HFA inhaler.  Subjective and objective measures of pulmonary function will be followed and the treatment plan will be adjusted accordingly.  Perennial allergic rhinitis Stable.  Continue appropriate allergen avoidance measures, montelukast 10 mg daily, and fluticasone/azelastine nasal spray if needed.  Nasal saline spray (i.e., Simply Saline) or nasal saline lavage (i.e., NeilMed) is recommended as needed and prior to medicated nasal sprays.  GERD (gastroesophageal reflux disease)  Appropriate  reflux lifestyle modifications have been provided.  Famotidine (Pepcid) 20 mg twice daily if needed.   Meds ordered this encounter  Medications  . levocetirizine (XYZAL) 5 MG tablet    Sig: Take 1 tablet by mouth once daily as needed for runny nose    Dispense:  30 tablet    Refill:  5  . montelukast (SINGULAIR) 10 MG tablet    Sig: Take 1 tablet daily at bedtime.    Dispense:  90 tablet    Refill:  1  . fluticasone (FLOVENT HFA) 110 MCG/ACT inhaler    Sig: Inhale 2 puffs into the lungs 2 (two) times daily.    Dispense:  1 Inhaler    Refill:  5    Diagnostics: Spirometry reveals an FEV1 of 75% predicted with an FEV1 ratio of 107%.  This study was performed while the patient was asymptomatic.  Please see scanned spirometry results for details.    Physical examination: Blood pressure 124/64, pulse 66, temperature 97.8 F (36.6 C), temperature source Temporal, resp. rate 16, height 5' 9.5" (1.765 m), weight 185 lb (83.9 kg), SpO2 97 %.  General: Alert, interactive, in no acute distress. HEENT: TMs pearly gray, turbinates mildly edematous without discharge, post-pharynx unremarkable. Neck: Supple without lymphadenopathy. Lungs: Clear to auscultation without wheezing, rhonchi or rales. CV: Normal S1, S2 without murmurs. Skin: Warm and dry, without lesions or rashes.  The following portions of the patient's history were reviewed and updated as appropriate: allergies, current medications, past family history, past medical history, past social history, past surgical history and problem list.  Current Outpatient Medications  Medication Sig Dispense Refill  . acetaminophen (TYLENOL) 500 MG tablet Take 1,000 mg  by mouth at bedtime as needed (pain).    [name] albuterol (PROAIR HFA) 108 (90 Base) MCG/ACT inhaler Inhale 1-2 puffs every 6 hours as needed for cough or wheeze. 18 g 2  . azelastine (ASTELIN) 0.1 % nasal spray Use 2 sprays twice daily as needed. 30 mL 5  . cetirizine (ZYRTEC) 10  MG tablet Take 10 mg by mouth daily.    . Cholecalciferol (VITAMIN D PO) Take 1 tablet by mouth daily.    . diphenhydrAMINE (BENADRYL) 25 MG tablet Take 25 mg by mouth at bedtime as needed for allergies.    . famotidine (PEPCID) 20 MG tablet Take 1 tablet (20 mg total) by mouth 2 (two) times daily. As needed. 60 tablet 5  . FLOVENT HFA 110 MCG/ACT inhaler Please specify directions, refills and quantity 1 Inhaler 5  . fluticasone (FLONASE) 50 MCG/ACT nasal spray PLACE 2 SPRAYS INTO BOTH NOSTRILS DAILY. AS NEEDED. 48 mL 0  . levocetirizine (XYZAL) 5 MG tablet Take 1 tablet by mouth once daily as needed for runny nose 30 tablet 5  . losartan (COZAAR) 25 MG tablet Take 50 mg by mouth daily.    [name] losartan (COZAAR) 50 MG tablet Take 50 mg by mouth at bedtime.     . montelukast (SINGULAIR) 10 MG tablet Take 1 tablet daily at bedtime. 90 tablet 1  . pantoprazole (PROTONIX) 40 MG tablet TAKE 1 TABLET BY MOUTH EVERY DAY 30 tablet 6  . triamcinolone cream (KENALOG) 0.1 % SMARTSIG:1 Application Topical 2-3 Times Daily    . fluticasone (FLOVENT HFA) 110 MCG/ACT inhaler Inhale 2 puffs into the lungs 2 (two) times daily. 1 Inhaler 5   No current facility-administered medications for this visit.    Allergies  Allergen Reactions  . Percocet [Oxycodone-Acetaminophen] Nausea And Vomiting  . Sulfonamide Derivatives Hives    I appreciate the opportunity to take part in [facility]'s care. Please do not hesitate to contact me with questions.  Sincerely,   R. Edgar Frisk, MD

## 2020-02-29 ENCOUNTER — Encounter: Payer: Self-pay | Admitting: Allergy and Immunology

## 2020-03-06 DIAGNOSIS — M50322 Other cervical disc degeneration at C5-C6 level: Secondary | ICD-10-CM | POA: Diagnosis not present

## 2020-03-06 DIAGNOSIS — M9901 Segmental and somatic dysfunction of cervical region: Secondary | ICD-10-CM | POA: Diagnosis not present

## 2020-03-06 DIAGNOSIS — M5136 Other intervertebral disc degeneration, lumbar region: Secondary | ICD-10-CM | POA: Diagnosis not present

## 2020-03-06 DIAGNOSIS — M9903 Segmental and somatic dysfunction of lumbar region: Secondary | ICD-10-CM | POA: Diagnosis not present

## 2020-03-13 DIAGNOSIS — M50322 Other cervical disc degeneration at C5-C6 level: Secondary | ICD-10-CM | POA: Diagnosis not present

## 2020-03-13 DIAGNOSIS — M9901 Segmental and somatic dysfunction of cervical region: Secondary | ICD-10-CM | POA: Diagnosis not present

## 2020-03-13 DIAGNOSIS — M9903 Segmental and somatic dysfunction of lumbar region: Secondary | ICD-10-CM | POA: Diagnosis not present

## 2020-03-13 DIAGNOSIS — M5136 Other intervertebral disc degeneration, lumbar region: Secondary | ICD-10-CM | POA: Diagnosis not present

## 2020-03-20 DIAGNOSIS — M9903 Segmental and somatic dysfunction of lumbar region: Secondary | ICD-10-CM | POA: Diagnosis not present

## 2020-03-20 DIAGNOSIS — M9901 Segmental and somatic dysfunction of cervical region: Secondary | ICD-10-CM | POA: Diagnosis not present

## 2020-03-20 DIAGNOSIS — M5136 Other intervertebral disc degeneration, lumbar region: Secondary | ICD-10-CM | POA: Diagnosis not present

## 2020-03-20 DIAGNOSIS — M50322 Other cervical disc degeneration at C5-C6 level: Secondary | ICD-10-CM | POA: Diagnosis not present

## 2020-04-10 DIAGNOSIS — M50322 Other cervical disc degeneration at C5-C6 level: Secondary | ICD-10-CM | POA: Diagnosis not present

## 2020-04-10 DIAGNOSIS — M9901 Segmental and somatic dysfunction of cervical region: Secondary | ICD-10-CM | POA: Diagnosis not present

## 2020-04-10 DIAGNOSIS — M5136 Other intervertebral disc degeneration, lumbar region: Secondary | ICD-10-CM | POA: Diagnosis not present

## 2020-04-10 DIAGNOSIS — M9903 Segmental and somatic dysfunction of lumbar region: Secondary | ICD-10-CM | POA: Diagnosis not present

## 2020-04-14 DIAGNOSIS — D485 Neoplasm of uncertain behavior of skin: Secondary | ICD-10-CM | POA: Diagnosis not present

## 2020-04-14 DIAGNOSIS — L988 Other specified disorders of the skin and subcutaneous tissue: Secondary | ICD-10-CM | POA: Diagnosis not present

## 2020-05-13 ENCOUNTER — Other Ambulatory Visit: Payer: Self-pay | Admitting: Urology

## 2020-05-15 DIAGNOSIS — M9903 Segmental and somatic dysfunction of lumbar region: Secondary | ICD-10-CM | POA: Diagnosis not present

## 2020-05-15 DIAGNOSIS — M9904 Segmental and somatic dysfunction of sacral region: Secondary | ICD-10-CM | POA: Diagnosis not present

## 2020-05-15 DIAGNOSIS — M5136 Other intervertebral disc degeneration, lumbar region: Secondary | ICD-10-CM | POA: Diagnosis not present

## 2020-05-15 DIAGNOSIS — M9905 Segmental and somatic dysfunction of pelvic region: Secondary | ICD-10-CM | POA: Diagnosis not present

## 2020-07-03 DIAGNOSIS — M9904 Segmental and somatic dysfunction of sacral region: Secondary | ICD-10-CM | POA: Diagnosis not present

## 2020-07-03 DIAGNOSIS — M5136 Other intervertebral disc degeneration, lumbar region: Secondary | ICD-10-CM | POA: Diagnosis not present

## 2020-07-03 DIAGNOSIS — M9903 Segmental and somatic dysfunction of lumbar region: Secondary | ICD-10-CM | POA: Diagnosis not present

## 2020-07-03 DIAGNOSIS — M9905 Segmental and somatic dysfunction of pelvic region: Secondary | ICD-10-CM | POA: Diagnosis not present

## 2020-07-10 DIAGNOSIS — M5136 Other intervertebral disc degeneration, lumbar region: Secondary | ICD-10-CM | POA: Diagnosis not present

## 2020-07-10 DIAGNOSIS — M9904 Segmental and somatic dysfunction of sacral region: Secondary | ICD-10-CM | POA: Diagnosis not present

## 2020-07-10 DIAGNOSIS — M9903 Segmental and somatic dysfunction of lumbar region: Secondary | ICD-10-CM | POA: Diagnosis not present

## 2020-07-10 DIAGNOSIS — M9905 Segmental and somatic dysfunction of pelvic region: Secondary | ICD-10-CM | POA: Diagnosis not present

## 2020-08-02 ENCOUNTER — Ambulatory Visit: Payer: PPO | Admitting: Allergy and Immunology

## 2020-08-11 ENCOUNTER — Other Ambulatory Visit: Payer: Self-pay | Admitting: Urology

## 2020-08-14 DIAGNOSIS — M9903 Segmental and somatic dysfunction of lumbar region: Secondary | ICD-10-CM | POA: Diagnosis not present

## 2020-08-14 DIAGNOSIS — M9904 Segmental and somatic dysfunction of sacral region: Secondary | ICD-10-CM | POA: Diagnosis not present

## 2020-08-14 DIAGNOSIS — M5136 Other intervertebral disc degeneration, lumbar region: Secondary | ICD-10-CM | POA: Diagnosis not present

## 2020-08-14 DIAGNOSIS — M9905 Segmental and somatic dysfunction of pelvic region: Secondary | ICD-10-CM | POA: Diagnosis not present

## 2020-08-24 ENCOUNTER — Other Ambulatory Visit: Payer: Self-pay | Admitting: Allergy and Immunology

## 2020-09-14 ENCOUNTER — Other Ambulatory Visit: Payer: Self-pay | Admitting: Allergy and Immunology

## 2020-10-10 ENCOUNTER — Other Ambulatory Visit: Payer: Self-pay | Admitting: Allergy and Immunology

## 2020-10-12 ENCOUNTER — Ambulatory Visit: Payer: PPO | Admitting: Family

## 2020-10-12 ENCOUNTER — Other Ambulatory Visit: Payer: Self-pay

## 2020-10-12 ENCOUNTER — Encounter: Payer: Self-pay | Admitting: Allergy and Immunology

## 2020-10-12 VITALS — BP 118/72 | HR 70 | Temp 98.1°F | Resp 16

## 2020-10-12 DIAGNOSIS — K219 Gastro-esophageal reflux disease without esophagitis: Secondary | ICD-10-CM

## 2020-10-12 DIAGNOSIS — J4530 Mild persistent asthma, uncomplicated: Secondary | ICD-10-CM

## 2020-10-12 DIAGNOSIS — J3089 Other allergic rhinitis: Secondary | ICD-10-CM | POA: Diagnosis not present

## 2020-10-12 MED ORDER — FLOVENT HFA 110 MCG/ACT IN AERO
2.0000 | INHALATION_SPRAY | Freq: Two times a day (BID) | RESPIRATORY_TRACT | 3 refills | Status: DC
Start: 2020-10-12 — End: 2021-10-12

## 2020-10-12 MED ORDER — FLUTICASONE PROPIONATE 50 MCG/ACT NA SUSP: NASAL | 3 refills | Status: DC

## 2020-10-12 MED ORDER — AZELASTINE HCL 0.1 % NA SOLN
NASAL | 5 refills | Status: DC
Start: 2020-10-12 — End: 2022-01-07

## 2020-10-12 MED ORDER — FAMOTIDINE 20 MG PO TABS: 20.0000 mg | ORAL_TABLET | Freq: Two times a day (BID) | ORAL | 3 refills | Status: DC

## 2020-10-12 MED ORDER — MONTELUKAST SODIUM 10 MG PO TABS: ORAL_TABLET | ORAL | 5 refills | Status: DC

## 2020-10-12 MED ORDER — ALBUTEROL SULFATE HFA 108 (90 BASE) MCG/ACT IN AERS
INHALATION_SPRAY | RESPIRATORY_TRACT | 2 refills | Status: DC
Start: 2020-10-12 — End: 2022-01-07

## 2020-10-12 NOTE — Progress Notes (Addendum)
[address] [place] [address] Dept: [phone]  FOLLOW UP NOTE  Patient ID: [name] [name], male    DOB: 01/24/1946  Age: 74 y.o. MRN: [id number] Date of Office Visit: 10/12/2020  Assessment  Chief Complaint: Asthma  HPI [name] [name] is a 74 year old male who presents today for follow-up of mild persistent asthma, perennial allergic rhinitis, and gastroesophageal reflux disease.  He was last seen on February 28, 2020 by Dr. Verlin Fester.  Mild persistent asthma is reported as moderately controlled with montelukast 10 mg once a day, albuterol as needed, and Flovent 110 mcg for respiratory flares. He reports coughing due to drainage and a little bit of wheezing from time to time.  He denies any shortness of breath, tightness in his chest, and nocturnal awakenings. Since his last office visit he has not required any systemic steroids or made any trips to the emergency room or urgent care due to breathing problems.  He uses his albuterol inhaler approximately once a week. His ACT score today is 22.  Perennial allergic rhinitis is reported as moderately controlled with fluticasone nasal spray, azelastine nasal spray, and montelukast 10 mg.  He reports rhinorrhea, nasal congestion postnasal drip for which fluticasone nasal spray and azelastine nasal spray help.  He denies any itchy watery eyes and sinus tenderness.  Gastroesophageal reflux disease is reported as doing well with famotidine 20 mg as needed.   Drug Allergies:  Allergies  Allergen Reactions  . Percocet [Oxycodone-Acetaminophen] Nausea And Vomiting  . Sulfonamide Derivatives Hives    Review of Systems: Review of Systems  Constitutional: Negative for chills and fever.  HENT:       Reports post nasal drip, rhinorrhea, and nasal congestion. Denies sinus tenderness  Eyes:       Denies itchy water eyes  Respiratory: Positive for cough and wheezing. Negative for shortness of breath.        Reports cough from drainage and  wheeze from time to time  Cardiovascular: Negative for chest pain and palpitations.  Gastrointestinal: Negative for abdominal pain and heartburn.  Genitourinary: Negative for dysuria.  Skin: Positive for itching. Negative for rash.       Reports itching due to dry skin  Neurological: Negative for headaches.  Endo/Heme/Allergies: Positive for environmental allergies.    Physical Exam: BP 118/72   Pulse 70   Temp 98.1 F (36.7 C) (Oral)   Resp 16   SpO2 96%    Physical Exam Constitutional:      Appearance: Normal appearance.  HENT:     Head: Normocephalic and atraumatic.     Comments: Pharynx normal. Eyes normal. Ears normal. Nose slightly erythematous with no drainage noted    Right Ear: Tympanic membrane, ear canal and external ear normal.     Left Ear: Tympanic membrane, ear canal and external ear normal.     Mouth/Throat:     Mouth: Mucous membranes are moist.     Pharynx: Oropharynx is clear.  Eyes:     Conjunctiva/sclera: Conjunctivae normal.  Cardiovascular:     Rate and Rhythm: Normal rate and regular rhythm.     Heart sounds: Normal heart sounds.  Pulmonary:     Effort: Pulmonary effort is normal.     Comments: Wheezing heard in right lower lobe. Other lung fields clear to auscultation. Lungs clear throughout after 4 puffs Xopenex given. Musculoskeletal:     Cervical back: Neck supple.  Skin:    General: Skin is warm.  Neurological:  Mental Status: He is alert and oriented to person, place, and time.  Psychiatric:        Mood and Affect: Mood normal.        Behavior: Behavior normal.        Thought Content: Thought content normal.        Judgment: Judgment normal.     Diagnostics: FVC 3.39 L, FEV1 2.68 L.  Predicted FVC 4.36 L, FEV1 3.19 L.  Spirometry indicates mild restriction.  Status post bronchodilator response shows FVC 3.63 L, FEV1 2.71 L.  Spirometry indicates normal ventilatory function with no significant bronchodilator response.  Assessment  and Plan: 1. Mild persistent asthma without complication   2. Perennial allergic rhinitis   3. Gastroesophageal reflux disease, unspecified whether esophagitis present     No orders of the defined types were placed in this encounter.   Patient Instructions  Mild persistent asthma Continue montelukast 10 mg once a day to help prevent cough and wheeze Continue albuterol 2 puffs every 4 hours as needed for cough, wheeze, tightness in chest,or shortness of breath. For asthma flares begin Flovent 110 mcg 2 puffs twice a day until symptoms return to baseline  Perennial allergic rhinitis Continue montelukast as above. Continue fluticasone nasal spray 2 sprays each nostril twice a day as needed for stuffy nose Continue azelastine nasal spray 1-2 sprays each nostril twice a day as needed for runny nose/drainage May use saline nasal spray or saline nasal rinse as needed for nasal symptoms. Use this prior to any medicated nasal sprays  Gastroesophageal reflux Continue dietary and lifestyle modifications Continue famotidine (Pepcid) 20 mg as needed  Please let us know if this treatment plan is not working well for you. Schedule a follow up appointment in 5 months   Return in about 5 months (around 03/12/2021), or if symptoms worsen or fail to improve.    Thank you for the opportunity to care for this patient.  Please do not hesitate to contact me with questions.  Althea Charon, FNP [facility]  ________________________________________________  I have provided oversight concerning Altha Harm [facility]'s evaluation and treatment of this patient's health issues addressed during today's encounter.  I agree with the assessment and therapeutic plan as outlined in the note.   Signed,   R Edgar Frisk, MD

## 2020-10-12 NOTE — Patient Instructions (Addendum)
Mild persistent asthma Continue montelukast 10 mg once a day to help prevent cough and wheeze Continue albuterol 2 puffs every 4 hours as needed for cough, wheeze, tightness in chest,or shortness of breath. For asthma flares begin Flovent 110 mcg 2 puffs twice a day until symptoms return to baseline  Perennial allergic rhinitis Continue montelukast as above. Continue fluticasone nasal spray 2 sprays each nostril twice a day as needed for stuffy nose Continue azelastine nasal spray 1-2 sprays each nostril twice a day as needed for runny nose/drainage May use saline nasal spray or saline nasal rinse as needed for nasal symptoms. Use this prior to any medicated nasal sprays  Gastroesophageal reflux Continue dietary and lifestyle modifications Continue famotidine (Pepcid) 20 mg as needed  Please let us know if this treatment plan is not working well for you. Schedule a follow up appointment in 5 months

## 2020-10-24 DIAGNOSIS — U071 COVID-19: Secondary | ICD-10-CM | POA: Diagnosis not present

## 2020-10-30 ENCOUNTER — Telehealth: Payer: Self-pay | Admitting: Allergy and Immunology

## 2020-10-30 NOTE — Telephone Encounter (Signed)
Pt request refill for Pulmicort.

## 2020-10-30 NOTE — Telephone Encounter (Signed)
I spoke with [name] [name] and let him know that due to the medication not being on his current list or in the discharge instructions I was not able to send a refill in without reaching out to the provider first. Patient told me to disregard and ended the call.

## 2020-10-30 NOTE — Telephone Encounter (Signed)
Left message requesting call back. Pulmicort is not on patient's med list nor is it listed on the discharge instructions from the last visit.

## 2020-11-13 ENCOUNTER — Other Ambulatory Visit: Payer: Self-pay | Admitting: Urology

## 2020-11-22 ENCOUNTER — Other Ambulatory Visit: Payer: Self-pay | Admitting: Allergy and Immunology

## 2020-11-23 DIAGNOSIS — R0602 Shortness of breath: Secondary | ICD-10-CM | POA: Diagnosis not present

## 2020-12-01 DIAGNOSIS — J45909 Unspecified asthma, uncomplicated: Secondary | ICD-10-CM | POA: Diagnosis not present

## 2020-12-01 DIAGNOSIS — R5383 Other fatigue: Secondary | ICD-10-CM | POA: Diagnosis not present

## 2020-12-01 DIAGNOSIS — Z8616 Personal history of COVID-19: Secondary | ICD-10-CM | POA: Diagnosis not present

## 2021-01-01 DIAGNOSIS — M9905 Segmental and somatic dysfunction of pelvic region: Secondary | ICD-10-CM | POA: Diagnosis not present

## 2021-01-01 DIAGNOSIS — M9903 Segmental and somatic dysfunction of lumbar region: Secondary | ICD-10-CM | POA: Diagnosis not present

## 2021-01-01 DIAGNOSIS — M5136 Other intervertebral disc degeneration, lumbar region: Secondary | ICD-10-CM | POA: Diagnosis not present

## 2021-01-01 DIAGNOSIS — M9904 Segmental and somatic dysfunction of sacral region: Secondary | ICD-10-CM | POA: Diagnosis not present

## 2021-01-08 DIAGNOSIS — M9904 Segmental and somatic dysfunction of sacral region: Secondary | ICD-10-CM | POA: Diagnosis not present

## 2021-01-08 DIAGNOSIS — M5136 Other intervertebral disc degeneration, lumbar region: Secondary | ICD-10-CM | POA: Diagnosis not present

## 2021-01-08 DIAGNOSIS — M9903 Segmental and somatic dysfunction of lumbar region: Secondary | ICD-10-CM | POA: Diagnosis not present

## 2021-01-08 DIAGNOSIS — M9905 Segmental and somatic dysfunction of pelvic region: Secondary | ICD-10-CM | POA: Diagnosis not present

## 2021-01-15 DIAGNOSIS — M9904 Segmental and somatic dysfunction of sacral region: Secondary | ICD-10-CM | POA: Diagnosis not present

## 2021-01-15 DIAGNOSIS — M9903 Segmental and somatic dysfunction of lumbar region: Secondary | ICD-10-CM | POA: Diagnosis not present

## 2021-01-15 DIAGNOSIS — M9905 Segmental and somatic dysfunction of pelvic region: Secondary | ICD-10-CM | POA: Diagnosis not present

## 2021-01-15 DIAGNOSIS — M5136 Other intervertebral disc degeneration, lumbar region: Secondary | ICD-10-CM | POA: Diagnosis not present

## 2021-01-22 DIAGNOSIS — M5136 Other intervertebral disc degeneration, lumbar region: Secondary | ICD-10-CM | POA: Diagnosis not present

## 2021-01-22 DIAGNOSIS — M9904 Segmental and somatic dysfunction of sacral region: Secondary | ICD-10-CM | POA: Diagnosis not present

## 2021-01-22 DIAGNOSIS — M9905 Segmental and somatic dysfunction of pelvic region: Secondary | ICD-10-CM | POA: Diagnosis not present

## 2021-01-22 DIAGNOSIS — M9903 Segmental and somatic dysfunction of lumbar region: Secondary | ICD-10-CM | POA: Diagnosis not present

## 2021-02-06 ENCOUNTER — Other Ambulatory Visit: Payer: Self-pay | Admitting: Urology

## 2021-02-22 DIAGNOSIS — I129 Hypertensive chronic kidney disease with stage 1 through stage 4 chronic kidney disease, or unspecified chronic kidney disease: Secondary | ICD-10-CM | POA: Diagnosis not present

## 2021-02-22 DIAGNOSIS — R946 Abnormal results of thyroid function studies: Secondary | ICD-10-CM | POA: Diagnosis not present

## 2021-02-22 DIAGNOSIS — R7309 Other abnormal glucose: Secondary | ICD-10-CM | POA: Diagnosis not present

## 2021-02-22 DIAGNOSIS — E785 Hyperlipidemia, unspecified: Secondary | ICD-10-CM | POA: Diagnosis not present

## 2021-02-22 DIAGNOSIS — E559 Vitamin D deficiency, unspecified: Secondary | ICD-10-CM | POA: Diagnosis not present

## 2021-02-26 DIAGNOSIS — M5136 Other intervertebral disc degeneration, lumbar region: Secondary | ICD-10-CM | POA: Diagnosis not present

## 2021-02-26 DIAGNOSIS — M9903 Segmental and somatic dysfunction of lumbar region: Secondary | ICD-10-CM | POA: Diagnosis not present

## 2021-02-26 DIAGNOSIS — M9905 Segmental and somatic dysfunction of pelvic region: Secondary | ICD-10-CM | POA: Diagnosis not present

## 2021-02-26 DIAGNOSIS — M9904 Segmental and somatic dysfunction of sacral region: Secondary | ICD-10-CM | POA: Diagnosis not present

## 2021-03-01 DIAGNOSIS — E785 Hyperlipidemia, unspecified: Secondary | ICD-10-CM | POA: Diagnosis not present

## 2021-03-01 DIAGNOSIS — Z0000 Encounter for general adult medical examination without abnormal findings: Secondary | ICD-10-CM | POA: Diagnosis not present

## 2021-03-01 DIAGNOSIS — N182 Chronic kidney disease, stage 2 (mild): Secondary | ICD-10-CM | POA: Diagnosis not present

## 2021-03-01 DIAGNOSIS — I129 Hypertensive chronic kidney disease with stage 1 through stage 4 chronic kidney disease, or unspecified chronic kidney disease: Secondary | ICD-10-CM | POA: Diagnosis not present

## 2021-03-01 DIAGNOSIS — N401 Enlarged prostate with lower urinary tract symptoms: Secondary | ICD-10-CM | POA: Diagnosis not present

## 2021-03-01 DIAGNOSIS — K219 Gastro-esophageal reflux disease without esophagitis: Secondary | ICD-10-CM | POA: Diagnosis not present

## 2021-03-01 DIAGNOSIS — Z1211 Encounter for screening for malignant neoplasm of colon: Secondary | ICD-10-CM | POA: Diagnosis not present

## 2021-03-02 DIAGNOSIS — L570 Actinic keratosis: Secondary | ICD-10-CM | POA: Diagnosis not present

## 2021-03-02 DIAGNOSIS — Z86018 Personal history of other benign neoplasm: Secondary | ICD-10-CM | POA: Diagnosis not present

## 2021-03-02 DIAGNOSIS — D2272 Melanocytic nevi of left lower limb, including hip: Secondary | ICD-10-CM | POA: Diagnosis not present

## 2021-03-02 DIAGNOSIS — L738 Other specified follicular disorders: Secondary | ICD-10-CM | POA: Diagnosis not present

## 2021-03-02 DIAGNOSIS — L7211 Pilar cyst: Secondary | ICD-10-CM | POA: Diagnosis not present

## 2021-03-02 DIAGNOSIS — L578 Other skin changes due to chronic exposure to nonionizing radiation: Secondary | ICD-10-CM | POA: Diagnosis not present

## 2021-03-02 DIAGNOSIS — D225 Melanocytic nevi of trunk: Secondary | ICD-10-CM | POA: Diagnosis not present

## 2021-03-02 DIAGNOSIS — L821 Other seborrheic keratosis: Secondary | ICD-10-CM | POA: Diagnosis not present

## 2021-03-05 DIAGNOSIS — M9904 Segmental and somatic dysfunction of sacral region: Secondary | ICD-10-CM | POA: Diagnosis not present

## 2021-03-05 DIAGNOSIS — M9905 Segmental and somatic dysfunction of pelvic region: Secondary | ICD-10-CM | POA: Diagnosis not present

## 2021-03-05 DIAGNOSIS — M5136 Other intervertebral disc degeneration, lumbar region: Secondary | ICD-10-CM | POA: Diagnosis not present

## 2021-03-05 DIAGNOSIS — M9903 Segmental and somatic dysfunction of lumbar region: Secondary | ICD-10-CM | POA: Diagnosis not present

## 2021-03-19 DIAGNOSIS — M9905 Segmental and somatic dysfunction of pelvic region: Secondary | ICD-10-CM | POA: Diagnosis not present

## 2021-03-19 DIAGNOSIS — M9903 Segmental and somatic dysfunction of lumbar region: Secondary | ICD-10-CM | POA: Diagnosis not present

## 2021-03-19 DIAGNOSIS — M5136 Other intervertebral disc degeneration, lumbar region: Secondary | ICD-10-CM | POA: Diagnosis not present

## 2021-03-19 DIAGNOSIS — M9904 Segmental and somatic dysfunction of sacral region: Secondary | ICD-10-CM | POA: Diagnosis not present

## 2021-03-26 DIAGNOSIS — M9904 Segmental and somatic dysfunction of sacral region: Secondary | ICD-10-CM | POA: Diagnosis not present

## 2021-03-26 DIAGNOSIS — M9905 Segmental and somatic dysfunction of pelvic region: Secondary | ICD-10-CM | POA: Diagnosis not present

## 2021-03-26 DIAGNOSIS — M5136 Other intervertebral disc degeneration, lumbar region: Secondary | ICD-10-CM | POA: Diagnosis not present

## 2021-03-26 DIAGNOSIS — M9903 Segmental and somatic dysfunction of lumbar region: Secondary | ICD-10-CM | POA: Diagnosis not present

## 2021-04-16 DIAGNOSIS — M9904 Segmental and somatic dysfunction of sacral region: Secondary | ICD-10-CM | POA: Diagnosis not present

## 2021-04-16 DIAGNOSIS — M9903 Segmental and somatic dysfunction of lumbar region: Secondary | ICD-10-CM | POA: Diagnosis not present

## 2021-04-16 DIAGNOSIS — M5136 Other intervertebral disc degeneration, lumbar region: Secondary | ICD-10-CM | POA: Diagnosis not present

## 2021-04-16 DIAGNOSIS — M9905 Segmental and somatic dysfunction of pelvic region: Secondary | ICD-10-CM | POA: Diagnosis not present

## 2021-05-04 ENCOUNTER — Other Ambulatory Visit: Payer: Self-pay | Admitting: Urology

## 2021-05-07 DIAGNOSIS — M9905 Segmental and somatic dysfunction of pelvic region: Secondary | ICD-10-CM | POA: Diagnosis not present

## 2021-05-07 DIAGNOSIS — M9904 Segmental and somatic dysfunction of sacral region: Secondary | ICD-10-CM | POA: Diagnosis not present

## 2021-05-07 DIAGNOSIS — M5136 Other intervertebral disc degeneration, lumbar region: Secondary | ICD-10-CM | POA: Diagnosis not present

## 2021-05-07 DIAGNOSIS — M9903 Segmental and somatic dysfunction of lumbar region: Secondary | ICD-10-CM | POA: Diagnosis not present

## 2021-06-18 DIAGNOSIS — M5136 Other intervertebral disc degeneration, lumbar region: Secondary | ICD-10-CM | POA: Diagnosis not present

## 2021-06-18 DIAGNOSIS — M9905 Segmental and somatic dysfunction of pelvic region: Secondary | ICD-10-CM | POA: Diagnosis not present

## 2021-06-18 DIAGNOSIS — M9904 Segmental and somatic dysfunction of sacral region: Secondary | ICD-10-CM | POA: Diagnosis not present

## 2021-06-18 DIAGNOSIS — M9903 Segmental and somatic dysfunction of lumbar region: Secondary | ICD-10-CM | POA: Diagnosis not present

## 2021-06-25 DIAGNOSIS — M5136 Other intervertebral disc degeneration, lumbar region: Secondary | ICD-10-CM | POA: Diagnosis not present

## 2021-06-25 DIAGNOSIS — M9903 Segmental and somatic dysfunction of lumbar region: Secondary | ICD-10-CM | POA: Diagnosis not present

## 2021-06-25 DIAGNOSIS — M9905 Segmental and somatic dysfunction of pelvic region: Secondary | ICD-10-CM | POA: Diagnosis not present

## 2021-06-25 DIAGNOSIS — M9904 Segmental and somatic dysfunction of sacral region: Secondary | ICD-10-CM | POA: Diagnosis not present

## 2021-06-26 ENCOUNTER — Encounter: Payer: Self-pay | Admitting: Internal Medicine

## 2021-07-02 DIAGNOSIS — M9904 Segmental and somatic dysfunction of sacral region: Secondary | ICD-10-CM | POA: Diagnosis not present

## 2021-07-02 DIAGNOSIS — M9905 Segmental and somatic dysfunction of pelvic region: Secondary | ICD-10-CM | POA: Diagnosis not present

## 2021-07-02 DIAGNOSIS — M5136 Other intervertebral disc degeneration, lumbar region: Secondary | ICD-10-CM | POA: Diagnosis not present

## 2021-07-02 DIAGNOSIS — M9903 Segmental and somatic dysfunction of lumbar region: Secondary | ICD-10-CM | POA: Diagnosis not present

## 2021-07-16 DIAGNOSIS — M9904 Segmental and somatic dysfunction of sacral region: Secondary | ICD-10-CM | POA: Diagnosis not present

## 2021-07-16 DIAGNOSIS — M5136 Other intervertebral disc degeneration, lumbar region: Secondary | ICD-10-CM | POA: Diagnosis not present

## 2021-07-16 DIAGNOSIS — M9905 Segmental and somatic dysfunction of pelvic region: Secondary | ICD-10-CM | POA: Diagnosis not present

## 2021-07-16 DIAGNOSIS — M9903 Segmental and somatic dysfunction of lumbar region: Secondary | ICD-10-CM | POA: Diagnosis not present

## 2021-07-23 DIAGNOSIS — M9905 Segmental and somatic dysfunction of pelvic region: Secondary | ICD-10-CM | POA: Diagnosis not present

## 2021-07-23 DIAGNOSIS — M9903 Segmental and somatic dysfunction of lumbar region: Secondary | ICD-10-CM | POA: Diagnosis not present

## 2021-07-23 DIAGNOSIS — M5136 Other intervertebral disc degeneration, lumbar region: Secondary | ICD-10-CM | POA: Diagnosis not present

## 2021-07-23 DIAGNOSIS — M9904 Segmental and somatic dysfunction of sacral region: Secondary | ICD-10-CM | POA: Diagnosis not present

## 2021-08-11 ENCOUNTER — Other Ambulatory Visit: Payer: Self-pay | Admitting: Urology

## 2021-08-20 DIAGNOSIS — M5136 Other intervertebral disc degeneration, lumbar region: Secondary | ICD-10-CM | POA: Diagnosis not present

## 2021-08-20 DIAGNOSIS — M9905 Segmental and somatic dysfunction of pelvic region: Secondary | ICD-10-CM | POA: Diagnosis not present

## 2021-08-20 DIAGNOSIS — M9903 Segmental and somatic dysfunction of lumbar region: Secondary | ICD-10-CM | POA: Diagnosis not present

## 2021-08-20 DIAGNOSIS — M9904 Segmental and somatic dysfunction of sacral region: Secondary | ICD-10-CM | POA: Diagnosis not present

## 2021-08-31 DIAGNOSIS — N401 Enlarged prostate with lower urinary tract symptoms: Secondary | ICD-10-CM | POA: Diagnosis not present

## 2021-08-31 DIAGNOSIS — R946 Abnormal results of thyroid function studies: Secondary | ICD-10-CM | POA: Diagnosis not present

## 2021-08-31 DIAGNOSIS — R7309 Other abnormal glucose: Secondary | ICD-10-CM | POA: Diagnosis not present

## 2021-08-31 DIAGNOSIS — Z125 Encounter for screening for malignant neoplasm of prostate: Secondary | ICD-10-CM | POA: Diagnosis not present

## 2021-08-31 DIAGNOSIS — N182 Chronic kidney disease, stage 2 (mild): Secondary | ICD-10-CM | POA: Diagnosis not present

## 2021-08-31 DIAGNOSIS — E785 Hyperlipidemia, unspecified: Secondary | ICD-10-CM | POA: Diagnosis not present

## 2021-08-31 DIAGNOSIS — R5383 Other fatigue: Secondary | ICD-10-CM | POA: Diagnosis not present

## 2021-09-07 DIAGNOSIS — K219 Gastro-esophageal reflux disease without esophagitis: Secondary | ICD-10-CM | POA: Diagnosis not present

## 2021-09-07 DIAGNOSIS — N401 Enlarged prostate with lower urinary tract symptoms: Secondary | ICD-10-CM | POA: Diagnosis not present

## 2021-09-07 DIAGNOSIS — J45909 Unspecified asthma, uncomplicated: Secondary | ICD-10-CM | POA: Diagnosis not present

## 2021-09-07 DIAGNOSIS — E785 Hyperlipidemia, unspecified: Secondary | ICD-10-CM | POA: Diagnosis not present

## 2021-09-07 DIAGNOSIS — I129 Hypertensive chronic kidney disease with stage 1 through stage 4 chronic kidney disease, or unspecified chronic kidney disease: Secondary | ICD-10-CM | POA: Diagnosis not present

## 2021-09-07 DIAGNOSIS — J302 Other seasonal allergic rhinitis: Secondary | ICD-10-CM | POA: Diagnosis not present

## 2021-09-07 DIAGNOSIS — E559 Vitamin D deficiency, unspecified: Secondary | ICD-10-CM | POA: Diagnosis not present

## 2021-09-07 DIAGNOSIS — Z125 Encounter for screening for malignant neoplasm of prostate: Secondary | ICD-10-CM | POA: Diagnosis not present

## 2021-09-27 DIAGNOSIS — H40042 Steroid responder, left eye: Secondary | ICD-10-CM | POA: Diagnosis not present

## 2021-09-27 DIAGNOSIS — H524 Presbyopia: Secondary | ICD-10-CM | POA: Diagnosis not present

## 2021-09-27 DIAGNOSIS — H52223 Regular astigmatism, bilateral: Secondary | ICD-10-CM | POA: Diagnosis not present

## 2021-09-27 DIAGNOSIS — H5213 Myopia, bilateral: Secondary | ICD-10-CM | POA: Diagnosis not present

## 2021-10-08 DIAGNOSIS — M9903 Segmental and somatic dysfunction of lumbar region: Secondary | ICD-10-CM | POA: Diagnosis not present

## 2021-10-08 DIAGNOSIS — M9904 Segmental and somatic dysfunction of sacral region: Secondary | ICD-10-CM | POA: Diagnosis not present

## 2021-10-08 DIAGNOSIS — M9905 Segmental and somatic dysfunction of pelvic region: Secondary | ICD-10-CM | POA: Diagnosis not present

## 2021-10-08 DIAGNOSIS — M5136 Other intervertebral disc degeneration, lumbar region: Secondary | ICD-10-CM | POA: Diagnosis not present

## 2021-10-12 ENCOUNTER — Other Ambulatory Visit: Payer: Self-pay

## 2021-10-12 ENCOUNTER — Ambulatory Visit (AMBULATORY_SURGERY_CENTER): Payer: PPO

## 2021-10-12 VITALS — Ht 71.0 in | Wt 185.0 lb

## 2021-10-12 DIAGNOSIS — Z1211 Encounter for screening for malignant neoplasm of colon: Secondary | ICD-10-CM

## 2021-10-12 MED ORDER — SUTAB 1479-225-188 MG PO TABS: 1.0000 | ORAL_TABLET | ORAL | 0 refills | Status: DC

## 2021-10-12 NOTE — Progress Notes (Signed)
No egg or soy allergy known to patient  No issues known to pt with past sedation with any surgeries or procedures Patient denies ever being told they had issues or difficulty with intubation  No FH of Malignant Hyperthermia Pt is not on diet pills Pt is not on home 02  Pt is not on blood thinners  Pt denies issues with constipation  No A fib or A flutter Pt is fully vaccinated for Covid x 1; Medicaid coupon given to pt in [facility] today and NO PA's for preps discussed with pt in [facility] today  Discussed with pt there will be an out-of-pocket cost for prep and that varies from $0 to 70 + dollars - pt verbalized understanding  Due to the COVID-19 pandemic we are asking patients to follow certain guidelines in [facility] and the [place]   Pt aware of COVID protocols and [facility] guidelines

## 2021-10-15 ENCOUNTER — Encounter: Payer: Self-pay | Admitting: Internal Medicine

## 2021-10-22 DIAGNOSIS — M79605 Pain in left leg: Secondary | ICD-10-CM | POA: Diagnosis not present

## 2021-10-26 ENCOUNTER — Encounter: Payer: Self-pay | Admitting: Internal Medicine

## 2021-10-26 ENCOUNTER — Other Ambulatory Visit: Payer: Self-pay

## 2021-10-26 ENCOUNTER — Ambulatory Visit (AMBULATORY_SURGERY_CENTER): Payer: PPO | Admitting: Internal Medicine

## 2021-10-26 VITALS — BP 113/43 | HR 62 | Temp 98.4°F | Resp 10 | Ht 71.0 in | Wt 185.0 lb

## 2021-10-26 DIAGNOSIS — Z1211 Encounter for screening for malignant neoplasm of colon: Secondary | ICD-10-CM | POA: Diagnosis not present

## 2021-10-26 DIAGNOSIS — D123 Benign neoplasm of transverse colon: Secondary | ICD-10-CM

## 2021-10-26 DIAGNOSIS — D122 Benign neoplasm of ascending colon: Secondary | ICD-10-CM

## 2021-10-26 MED ORDER — SODIUM CHLORIDE 0.9 % IV SOLN: 500.0000 mL | Freq: Once | INTRAVENOUS | Status: DC

## 2021-10-26 NOTE — Progress Notes (Signed)
Pt's states no medical or surgical changes since previsit or office visit. 

## 2021-10-26 NOTE — Patient Instructions (Addendum)
Handout on polyps and diverticulosis.  EGD scheduled for Jan. 16, 2023.   YOU HAD AN ENDOSCOPIC PROCEDURE TODAY AT [facility]:   Refer to the procedure report that was given to you for any specific questions about what was found during the examination.  If the procedure report does not answer your questions, please call your gastroenterologist to clarify.  If you requested that your care partner not be given the details of your procedure findings, then the procedure report has been included in a sealed envelope for you to review at your convenience later.  YOU SHOULD EXPECT: Some feelings of bloating in the abdomen. Passage of more gas than usual.  Walking can help get rid of the air that was put into your GI tract during the procedure and reduce the bloating. If you had a lower endoscopy (such as a colonoscopy or flexible sigmoidoscopy) you may notice spotting of blood in your stool or on the toilet paper. If you underwent a bowel prep for your procedure, you may not have a normal bowel movement for a few days.  Please Note:  You might notice some irritation and congestion in your nose or some drainage.  This is from the oxygen used during your procedure.  There is no need for concern and it should clear up in a day or so.  SYMPTOMS TO REPORT IMMEDIATELY:  Following lower endoscopy (colonoscopy or flexible sigmoidoscopy):  Excessive amounts of blood in the stool  Significant tenderness or worsening of abdominal pains  Swelling of the abdomen that is new, acute  Fever of 100F or higher   For urgent or emergent issues, a gastroenterologist can be reached at any hour by calling [phone]. Do not use MyChart messaging for urgent concerns.    DIET:  We do recommend a small meal at first, but then you may proceed to your regular diet.  Drink plenty of fluids but you should avoid alcoholic beverages for 24 hours.  ACTIVITY:  You should plan to take it easy for the rest of  today and you should NOT DRIVE or use heavy machinery until tomorrow (because of the sedation medicines used during the test).    FOLLOW UP: Our staff will call the number listed on your records 48-72 hours following your procedure to check on you and address any questions or concerns that you may have regarding the information given to you following your procedure. If we do not reach you, we will leave a message.  We will attempt to reach you two times.  During this call, we will ask if you have developed any symptoms of COVID 19. If you develop any symptoms (ie: fever, flu-like symptoms, shortness of breath, cough etc.) before then, please call [phone].  If you test positive for Covid 19 in the 2 weeks post procedure, please call and report this information to [place].    If any biopsies were taken you will be contacted by phone or by letter within the next 1-3 weeks.  Please call us at [phone] if you have not heard about the biopsies in 3 weeks.    SIGNATURES/CONFIDENTIALITY: You and/or your care partner have signed paperwork which will be entered into your electronic medical record.  These signatures attest to the fact that that the information above on your After Visit Summary has been reviewed and is understood.  Full responsibility of the confidentiality of this discharge information lies with you and/or your care-partner.

## 2021-10-26 NOTE — Op Note (Signed)
[place] Patient Name: [name] [name] Procedure Date: 10/26/2021 9:03 AM MRN: [id number] Endoscopist: Docia Chuck. [name] [name] , MD Age: 75 Referring MD:  Date of Birth: [birth year] Gender: Male Account #: [id number] Procedure:                Colonoscopy with cold snare polypectomy x 3 Indications:              Screening for colorectal malignant neoplasm.                            Previous examination 2012 (Dr. Olevia Perches) was negative                            for neoplasia Medicines:                Monitored Anesthesia Care Procedure:                Pre-Anesthesia Assessment:                           - Prior to the procedure, a History and Physical                            was performed, and patient medications and                            allergies were reviewed. The patient's tolerance of                            previous anesthesia was also reviewed. The risks                            and benefits of the procedure and the sedation                            options and risks were discussed with the patient.                            All questions were answered, and informed consent                            was obtained. Prior Anticoagulants: The patient has                            taken no previous anticoagulant or antiplatelet                            agents. ASA Grade Assessment: II - A patient with                            mild systemic disease. After reviewing the risks                            and benefits, the patient was deemed in  satisfactory condition to undergo the procedure.                           After obtaining informed consent, the colonoscope                            was passed under direct vision. Throughout the                            procedure, the patient's blood pressure, pulse, and                            oxygen saturations were monitored continuously. The                            Colonoscope was  introduced through the anus and                            advanced to the the cecum, identified by                            appendiceal orifice and ileocecal valve. The                            ileocecal valve, appendiceal orifice, and rectum                            were photographed. The quality of the bowel                            preparation was excellent. The colonoscopy was                            performed without difficulty. The patient tolerated                            the procedure well. The bowel preparation used was                            SUPREP via split dose instruction. Scope In: 9:26:21 AM Scope Out: 9:50:54 AM Scope Withdrawal Time: 0 hours 16 minutes 33 seconds  Total Procedure Duration: 0 hours 24 minutes 33 seconds  Findings:                 Three polyps were found in the transverse colon and                            ascending colon. The polyps were 3 to 10 mm in                            size. These polyps were removed with a cold snare.                            Resection and retrieval were complete.  Multiple diverticula were found in the left colon                            and right colon.                           Hemorrhoids were found during retroflexion. The                            hemorrhoids were small.                           The exam was otherwise without abnormality on                            direct and retroflexion views. Complications:            No immediate complications. Estimated blood loss:                            None. Estimated Blood Loss:     Estimated blood loss: none. Impression:               - Three 3 to 10 mm polyps in the transverse colon                            and in the ascending colon, removed with a cold                            snare. Resected and retrieved.                           - Diverticulosis in the left colon and in the right                             colon.                           - Hemorrhoids.                           - The examination was otherwise normal on direct                            and retroflexion views. Recommendation:           - Repeat colonoscopy in 3 years for surveillance.                           - Patient has a contact number available for                            emergencies. The signs and symptoms of potential                            delayed complications were discussed with the  patient. Return to normal activities tomorrow.                            Written discharge instructions were provided to the                            patient.                           - Resume previous diet.                           - Continue present medications.                           - Await pathology results.                           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 [name] Pastor, MD 10/26/2021 10:01:40 AM This report has been signed electronically.

## 2021-10-26 NOTE — Progress Notes (Signed)
C.W. vital signs. 

## 2021-10-26 NOTE — Progress Notes (Signed)
Called to room to assist during endoscopic procedure.  Patient ID and intended procedure confirmed with present staff. Received instructions for my participation in the procedure from the performing physician.  

## 2021-10-26 NOTE — Progress Notes (Signed)
Sedate, gd SR, tolerated procedure well, VSS, report to RN 

## 2021-10-26 NOTE — Progress Notes (Signed)
HISTORY OF PRESENT ILLNESS:  [name] [name] is a 75 y.o. male who presents today for screening colonoscopy.  Previous examination 2012 was negative for neoplasia.  REVIEW OF SYSTEMS:  All non-GI ROS negative. Past Medical History:  Diagnosis Date   Asthma    uses inhaler daily and as needed   Blood transfusion 2012   surgery- esophageal bleeding secondary to hiatal hernia   Constipation    Diverticulitis    Diverticulosis    Eczema    Esophageal stricture    GERD (gastroesophageal reflux disease)    HBP (high blood pressure)    on meds   Hyperlipemia    on meds   Kidney stones    hx of   Seasonal allergies    Trouble swallowing    Ulcer     Past Surgical History:  Procedure Laterality Date   COLONOSCOPY  2012   DB-F/V-mira(good)-tics   HIATAL HERNIA REPAIR  1992&2012   LAPAROSCOPIC NISSEN FUNDOPLICATION  0272, 53/66/44   WISDOM TOOTH EXTRACTION      Social History [name] [name]  reports that he quit smoking about 39 years ago. His smoking use included cigarettes. He has never used smokeless tobacco. He reports that he does not currently use alcohol. He reports that he does not use drugs.  family history includes Heart disease (age of onset: 78) in his father.  Allergies  Allergen Reactions   Percocet [Oxycodone-Acetaminophen] Nausea And Vomiting   Sulfacetamide Sodium Hives   Sulfonamide Derivatives Hives       PHYSICAL EXAMINATION:  Vital signs: BP (!) 115/55   Pulse 74   Temp 98.4 F (36.9 C)   Resp 14   Ht 5\' 11"  (1.803 m)   Wt 185 lb (83.9 kg)   SpO2 99%   BMI 25.80 kg/m  General: Well-developed, well-nourished, no acute distress HEENT: Sclerae are anicteric, conjunctiva pink. Oral mucosa intact Lungs: Clear Heart: Regular Abdomen: soft, nontender, nondistended, no obvious ascites, no peritoneal signs, normal bowel sounds. No organomegaly. Extremities: No edema Psychiatric: alert and oriented x3. Cooperative     ASSESSMENT:  1.  Colon  cancer screening.  Average risk   PLAN:  1.  Screening colonoscopy

## 2021-10-30 ENCOUNTER — Telehealth: Payer: Self-pay | Admitting: *Deleted

## 2021-10-30 NOTE — Telephone Encounter (Signed)
  Follow up Call-  Call back number 10/26/2021  Post procedure Call Back phone  # [phone]  Permission to leave phone message Yes  Some recent data might be hidden     Patient questions:  Do you have a fever, pain , or abdominal swelling? No. Pain Score  0 *  Have you tolerated food without any problems? Yes.  Have you been able to return to your normal activities? Yes.    Do you have any questions about your discharge instructions: Diet   No. Medications  No. Follow up visit  No.  Do you have questions or concerns about your Care? No.  Actions: * If pain score is 4 or above: No action needed, pain <4.   Have you developed a fever since your procedure? no  2.   Have you had an respiratory symptoms (SOB or cough) since your procedure? no  3.   Have you tested positive for COVID 19 since your procedure no  4.   Have you had any family members/close contacts diagnosed with the COVID 19 since your procedure?  no   If yes to any of these questions please route to Joylene Whitfield, RN and Joella Prince, RN

## 2021-10-31 ENCOUNTER — Encounter: Payer: Self-pay | Admitting: Internal Medicine

## 2021-11-08 DIAGNOSIS — H524 Presbyopia: Secondary | ICD-10-CM | POA: Diagnosis not present

## 2021-11-08 DIAGNOSIS — H40059 Ocular hypertension, unspecified eye: Secondary | ICD-10-CM | POA: Diagnosis not present

## 2021-11-08 DIAGNOSIS — H52223 Regular astigmatism, bilateral: Secondary | ICD-10-CM | POA: Diagnosis not present

## 2021-11-08 DIAGNOSIS — H40049 Steroid responder, unspecified eye: Secondary | ICD-10-CM | POA: Diagnosis not present

## 2021-11-08 DIAGNOSIS — H5213 Myopia, bilateral: Secondary | ICD-10-CM | POA: Diagnosis not present

## 2021-11-09 ENCOUNTER — Other Ambulatory Visit (HOSPITAL_COMMUNITY): Payer: Self-pay | Admitting: Orthopedic Surgery

## 2021-11-09 ENCOUNTER — Ambulatory Visit (HOSPITAL_COMMUNITY)
Admission: RE | Admit: 2021-11-09 | Discharge: 2021-11-09 | Disposition: A | Discharge status: Home / Self Care | Payer: PPO | Source: Ambulatory Visit | Attending: Cardiovascular Disease | Admitting: Cardiovascular Disease

## 2021-11-09 ENCOUNTER — Other Ambulatory Visit: Payer: Self-pay

## 2021-11-09 DIAGNOSIS — M79662 Pain in left lower leg: Secondary | ICD-10-CM | POA: Diagnosis not present

## 2021-11-09 DIAGNOSIS — M79605 Pain in left leg: Secondary | ICD-10-CM | POA: Diagnosis not present

## 2021-11-09 DIAGNOSIS — M7989 Other specified soft tissue disorders: Secondary | ICD-10-CM | POA: Diagnosis not present

## 2021-11-09 DIAGNOSIS — M79606 Pain in leg, unspecified: Secondary | ICD-10-CM | POA: Insufficient documentation

## 2021-11-13 ENCOUNTER — Other Ambulatory Visit: Payer: Self-pay | Admitting: Urology

## 2021-11-30 ENCOUNTER — Encounter: Payer: Self-pay | Admitting: Urology

## 2021-11-30 ENCOUNTER — Other Ambulatory Visit: Payer: Self-pay

## 2021-11-30 ENCOUNTER — Ambulatory Visit: Payer: PPO | Admitting: Urology

## 2021-11-30 VITALS — BP 150/79 | HR 72 | Ht 71.0 in | Wt 188.0 lb

## 2021-11-30 DIAGNOSIS — N5201 Erectile dysfunction due to arterial insufficiency: Secondary | ICD-10-CM | POA: Diagnosis not present

## 2021-11-30 DIAGNOSIS — R3912 Poor urinary stream: Secondary | ICD-10-CM

## 2021-11-30 DIAGNOSIS — N401 Enlarged prostate with lower urinary tract symptoms: Secondary | ICD-10-CM

## 2021-11-30 DIAGNOSIS — N138 Other obstructive and reflux uropathy: Secondary | ICD-10-CM

## 2021-11-30 DIAGNOSIS — R351 Nocturia: Secondary | ICD-10-CM | POA: Diagnosis not present

## 2021-11-30 LAB — URINALYSIS, ROUTINE W REFLEX MICROSCOPIC
Bilirubin, UA: NEGATIVE
Glucose, UA: NEGATIVE
Ketones, UA: NEGATIVE
Nitrite, UA: NEGATIVE
Protein,UA: NEGATIVE
Specific Gravity, UA: 1.015 (ref 1.005–1.030)
Urobilinogen, Ur: 0.2 mg/dL (ref 0.2–1.0)
pH, UA: 7 (ref 5.0–7.5)

## 2021-11-30 LAB — MICROSCOPIC EXAMINATION
Bacteria, UA: NONE SEEN
Renal Epithel, UA: NONE SEEN /hpf

## 2021-11-30 MED ORDER — SILODOSIN 8 MG PO CAPS: 8.0000 mg | ORAL_CAPSULE | Freq: Every day | ORAL | 11 refills | Status: DC

## 2021-11-30 NOTE — Progress Notes (Signed)
post void residual= 266  Urological Symptom Review  Patient is experiencing the following symptoms: Hard to postpone urination Get up at night to urinate Weak stream Erection problems (male only) Kidney stones   Review of Systems  Gastrointestinal (upper)  : Negative for upper GI symptoms  Gastrointestinal (lower) : Negative for lower GI symptoms  Constitutional : Negative for symptoms  Skin: Negative for skin symptoms  Eyes: Negative for eye symptoms  Ear/Nose/Throat : Negative for Ear/Nose/Throat symptoms  Hematologic/Lymphatic: Negative for Hematologic/Lymphatic symptoms  Cardiovascular : Negative for cardiovascular symptoms  Respiratory : Negative for respiratory symptoms  Endocrine: Negative for endocrine symptoms  Musculoskeletal: Negative for musculoskeletal symptoms  Neurological: Negative for neurological symptoms  Psychologic: Negative for psychiatric symptoms

## 2021-11-30 NOTE — Patient Instructions (Signed)

## 2021-11-30 NOTE — Progress Notes (Signed)
11/30/2021 10:10 AM   [name] [name] 1946/04/29 [id number]  Referring provider: No referring provider defined for this encounter.  BPH and erectile dysfunction   HPI: [name] is a 76yo here for evaluation of difficulty urinating and erectile dysfunction. IPSS 10 QOL 3 on flomax 0.4mg  daily. Nocturia 1x. Urine stream fair. PVR 266cc. He has erectile dysfunction but is currently no interested in therapy. No other complaints today  His records from [facility] are as follows: I have weakness and slowing of my urinary stream. HPI: [name] [name] is a 76 year-old male established patient who is here for a weak and slow urinary stream.??He first noticed the symptom approximately 11/26/2015. He does not have to strain or bear down to start his urinary stream. He does not have to wait a long time to start his urinary stream. ??He does not have an abnormal sensation when needing to urinate. He does not urinate more frequently than once every 4 hours in the daytime. He does not dribble at the end of urination. He does not have a split stream when he urinates. ??He has been treated with Flomax. The patient has never had a surgical procedure for bladder outlet obstruction to his prostate. ??12/11/2017: for the past 2 years he has noted worsening LUTS. He has a weak stream with associated urgency and frequency. NO nocturia 0-1x. No previous BPH meds. No recent PSA. He had an episode of epididymitis 1 month ago. ??01/09/2018: He had a significant improvement in his LUTS since starting flomax 0.4mg  ??03/27/2018: nocturia resolved and weak stream resolved on flomax 0.4mg  daily ??10/09/2018: no issues with weak stream ??04/09/2019: He has stable LUTS on flomax 0.4mg  daily. Weak stream resolved ??CC: I am having trouble with my erections. HPI: He first stated noticing pain on approximately 11/26/2015. His symptoms did begin gradually. His symptoms have been worse over the last year. ??He does have difficulties achieving an erection.  He does have problems maintaining his erections. His erections are straight. He has tried Licensed conveyancer. It did not work. ??He does not have premature ejaculation. He does not have trouble reaching climax. He does not have anxiety because of the symptoms. ??12/11/2017: Over the past several years he has noted worsening difficulty getting and maintaining. He tried levitra without effect. He has low libido. ??01/09/2018: Per patient  primary care his testosterone was low ??03/27/2018: testosterone 480. ??10/09/2018: He has intermittent ED which bothers him ??04/09/2019: He uses sildenafil prn for ED. ??CC: I get up too often at night to urinate. HPI: He first noticed the symptom approximately 09/26/2015. He usually gets up at night to urinate 1 time. He does have nights when he does not get up to urinate at all. He does not have trouble falling back asleep once he has been woken up at night. ??He does not usually have swelling in his hands and feet during the day. He does not take a diuretic. He does not have to strain or bear down to start his urinary stream. ??10/09/2018: He notes nocturia has resolved with flomax and leg elevation ??04/09/2019: Nocturia is stable at 1x on flomax 0.4mg  daily     PMH: Past Medical History:  Diagnosis Date   Asthma    uses inhaler daily and as needed   Blood transfusion 2012   surgery- esophageal bleeding secondary to hiatal hernia   Constipation    Diverticulitis    Diverticulosis    Eczema    Esophageal stricture    GERD (gastroesophageal reflux disease)  HBP (high blood pressure)    on meds   Hyperlipemia    on meds   Kidney stones    hx of   Seasonal allergies    Trouble swallowing    Ulcer     Surgical History: Past Surgical History:  Procedure Laterality Date   COLONOSCOPY  2012   DB-F/V-mira(good)-tics   HIATAL HERNIA REPAIR  1992&2012   LAPAROSCOPIC NISSEN FUNDOPLICATION  7654, 65/03/54   WISDOM TOOTH EXTRACTION      Home Medications:   Allergies as of 11/30/2021       Reactions   Percocet [oxycodone-acetaminophen] Nausea And Vomiting   Sulfacetamide Sodium Hives   Sulfonamide Derivatives Hives        Medication List        Accurate as of November 30, 2021 10:10 AM. If you have any questions, ask your nurse or doctor.          acetaminophen 500 MG tablet Commonly known as: TYLENOL Take 1,000 mg by mouth at bedtime as needed (pain).   albuterol 108 (90 Base) MCG/ACT inhaler Commonly known as: ProAir HFA Inhale 1-2 puffs every  4-6 hours as needed for cough or wheeze.   azelastine 0.1 % nasal spray Commonly known as: ASTELIN Use 1- 2 sprays each nostril twice daily as needed for runny nose/drainage.   diphenhydrAMINE 25 MG tablet Commonly known as: BENADRYL Take 25 mg by mouth at bedtime as needed for allergies.   doxycycline 100 MG capsule Commonly known as: VIBRAMYCIN doxycycline hyclate 100 mg capsule  TAKE 1 CAPSULE BY MOUTH TWICE A DAY   famotidine 20 MG tablet Commonly known as: Pepcid Take 1 tablet (20 mg total) by mouth 2 (two) times daily. As needed. What changed:  when to take this reasons to take this   Flovent HFA 110 MCG/ACT inhaler Generic drug: fluticasone Please specify directions, refills and quantity What changed: See the new instructions.   fluticasone 50 MCG/ACT nasal spray Commonly known as: FLONASE Use 2 sprays each nostril once a day as needed for stuffy nose   guaiFENesin 600 MG 12 hr tablet Commonly known as: MUCINEX 1 tablet as needed   guaiFENesin-codeine 100-10 MG/5ML syrup Commonly known as: ROBITUSSIN AC codeine 10 mg-guaifenesin 100 mg/5 mL oral liquid  TAKE 5 MLS (ONE TEASPOON) AS NEEDED ORALLY EVERY 6 HRS   guaiFENesin-codeine 100-10 MG/5ML syrup Take 5 mLs by mouth every 6 (six) hours as needed.   hydroxychloroquine 200 MG tablet Commonly known as: PLAQUENIL hydroxychloroquine 200 mg tablet  TAKE 1 TABLET BY MOUTH TWICE A DAY   levocetirizine 5  MG tablet Commonly known as: XYZAL TAKE 1 TABLET BY MOUTH ONCE DAILY AS NEEDED FOR RUNNY NOSE   losartan 25 MG tablet Commonly known as: COZAAR Take 50 mg by mouth daily.   montelukast 10 MG tablet Commonly known as: SINGULAIR TAKE 1 TABLET BY MOUTH EVERYDAY AT BEDTIME What changed:  how much to take how to take this when to take this reasons to take this   ondansetron 8 MG disintegrating tablet Commonly known as: ZOFRAN-ODT ondansetron 8 mg disintegrating tablet  DISSOLVE 1 TABLET ON THE TONGUE EVERY 6 HOURS AS NEEDED FOR NAUSEA   pantoprazole 40 MG tablet Commonly known as: PROTONIX TAKE 1 TABLET BY MOUTH EVERY DAY What changed:  when to take this reasons to take this   tamsulosin 0.4 MG Caps capsule Commonly known as: FLOMAX TAKE ONE CAPSULE BY MOUTH DAILY AT BEDTIME   triamcinolone cream 0.1 %  Commonly known as: KENALOG SMARTSIG:1 Application Topical 2-3 Times Daily   VITAMIN D PO Take 1 tablet by mouth daily.        Allergies:  Allergies  Allergen Reactions   Percocet [Oxycodone-Acetaminophen] Nausea And Vomiting   Sulfacetamide Sodium Hives   Sulfonamide Derivatives Hives    Family History: Family History  Problem Relation Age of Onset   Heart disease Father 43       heart attack   Colon cancer Neg Hx    Allergic rhinitis Neg Hx    Angioedema Neg Hx    Asthma Neg Hx    Eczema Neg Hx    Immunodeficiency Neg Hx    Urticaria Neg Hx    Colon polyps Neg Hx    Esophageal cancer Neg Hx    Rectal cancer Neg Hx    Stomach cancer Neg Hx     Social History:  reports that he quit smoking about 40 years ago. His smoking use included cigarettes. He has never used smokeless tobacco. He reports that he does not currently use alcohol. He reports that he does not use drugs.  ROS: All other review of systems were reviewed and are negative except what is noted above in HPI  Physical Exam: BP (!) 150/79    Pulse 72    Ht 5\' 11"  (1.803 m)    Wt 188 lb (85.3  kg)    BMI 26.22 kg/m   Constitutional:  Alert and oriented, No acute distress. HEENT: [place] AT, moist mucus membranes.  Trachea midline, no masses. Cardiovascular: No clubbing, cyanosis, or edema. Respiratory: Normal respiratory effort, no increased work of breathing. GI: Abdomen is soft, nontender, nondistended, no abdominal masses GU: No CVA tenderness.  Lymph: No cervical or inguinal lymphadenopathy. Skin: No rashes, bruises or suspicious lesions. Neurologic: Grossly intact, no focal deficits, moving all 4 extremities. Psychiatric: Normal mood and affect.  Laboratory Data: Lab Results  Component Value Date   WBC 7.6 10/15/2015   HGB 14.0 10/15/2015   HCT 41.5 10/15/2015   MCV 91.8 10/15/2015   PLT 183 10/15/2015    Lab Results  Component Value Date   CREATININE 1.14 10/15/2015    No results found for: PSA  No results found for: TESTOSTERONE  No results found for: HGBA1C  Urinalysis    Component Value Date/Time   COLORURINE YELLOW 10/15/2015 2004   APPEARANCEUR CLEAR 10/15/2015 2004   LABSPEC 1.015 10/15/2015 2004   PHURINE 6.5 10/15/2015 2004   GLUCOSEU NEGATIVE 10/15/2015 2004   HGBUR NEGATIVE 10/15/2015 2004   [place] NEGATIVE 10/15/2015 2004   [place] NEGATIVE 10/15/2015 2004   PROTEINUR NEGATIVE 10/15/2015 2004   UROBILINOGEN 0.2 08/27/2011 1750   NITRITE NEGATIVE 10/15/2015 2004   LEUKOCYTESUR NEGATIVE 10/15/2015 2004    No results found for: LABMICR, [place], RBCUA, LABEPIT, MUCUS, BACTERIA  Pertinent Imaging:  No results found for this or any previous visit.  No results found for this or any previous visit.  No results found for this or any previous visit.  No results found for this or any previous visit.  No results found for this or any previous visit.  No results found for this or any previous visit.  No results found for this or any previous visit.  No results found for this or any previous visit.   Assessment & Plan:    1.  Weak urinary stream -We will start rapaflo 8mg  daily - Urinalysis, Routine w reflex microscopic  2. Nocturia -Rapaflo 8mg  daily -  BLADDER SCAN AMB NON-IMAGING  3. Erectile dysfunction due to arterial insufficiency -patient defers therapy at this time  4. Benign prostatic hyperplasia with urinary obstruction -Rapaflo 8mg  daily   No follow-ups on file.  Nicolette Bang, MD  [facility] [place]

## 2021-12-10 ENCOUNTER — Ambulatory Visit (AMBULATORY_SURGERY_CENTER): Payer: PPO | Admitting: Internal Medicine

## 2021-12-10 ENCOUNTER — Encounter: Payer: Self-pay | Admitting: Internal Medicine

## 2021-12-10 VITALS — BP 142/67 | HR 63 | Temp 98.2°F | Resp 12 | Ht 71.0 in | Wt 185.0 lb

## 2021-12-10 DIAGNOSIS — Z9889 Other specified postprocedural states: Secondary | ICD-10-CM

## 2021-12-10 DIAGNOSIS — K2211 Ulcer of esophagus with bleeding: Secondary | ICD-10-CM

## 2021-12-10 DIAGNOSIS — R1013 Epigastric pain: Secondary | ICD-10-CM

## 2021-12-10 DIAGNOSIS — K219 Gastro-esophageal reflux disease without esophagitis: Secondary | ICD-10-CM

## 2021-12-10 MED ORDER — SODIUM CHLORIDE 0.9 % IV SOLN: 500.0000 mL | Freq: Once | INTRAVENOUS | Status: DC

## 2021-12-10 NOTE — Progress Notes (Signed)
Pt's states no medical or surgical changes since previous procedure visit on 10/26/21.   VS DT

## 2021-12-10 NOTE — Patient Instructions (Signed)
Please read handouts provided. Continue present medications.      YOU HAD AN ENDOSCOPIC PROCEDURE TODAY AT THE [facility]:   Refer to the procedure report that was given to you for any specific questions about what was found during the examination.  If the procedure report does not answer your questions, please call your gastroenterologist to clarify.  If you requested that your care partner not be given the details of your procedure findings, then the procedure report has been included in a sealed envelope for you to review at your convenience later.  YOU SHOULD EXPECT: Some feelings of bloating in the abdomen. Passage of more gas than usual.  Walking can help get rid of the air that was put into your GI tract during the procedure and reduce the bloating. If you had a lower endoscopy (such as a colonoscopy or flexible sigmoidoscopy) you may notice spotting of blood in your stool or on the toilet paper. If you underwent a bowel prep for your procedure, you may not have a normal bowel movement for a few days.  Please Note:  You might notice some irritation and congestion in your nose or some drainage.  This is from the oxygen used during your procedure.  There is no need for concern and it should clear up in a day or so.  SYMPTOMS TO REPORT IMMEDIATELY:    Following upper endoscopy (EGD)  Vomiting of blood or coffee ground material  New chest pain or pain under the shoulder blades  Painful or persistently difficult swallowing  New shortness of breath  Fever of 100F or higher  Black, tarry-looking stools  For urgent or emergent issues, a gastroenterologist can be reached at any hour by calling [phone]. Do not use MyChart messaging for urgent concerns.    DIET:  We do recommend a small meal at first, but then you may proceed to your regular diet.  Drink plenty of fluids but you should avoid alcoholic beverages for 24 hours.  ACTIVITY:  You should plan to take it easy  for the rest of today and you should NOT DRIVE or use heavy machinery until tomorrow (because of the sedation medicines used during the test).    FOLLOW UP: Our staff will call the number listed on your records 48-72 hours following your procedure to check on you and address any questions or concerns that you may have regarding the information given to you following your procedure. If we do not reach you, we will leave a message.  We will attempt to reach you two times.  During this call, we will ask if you have developed any symptoms of COVID 19. If you develop any symptoms (ie: fever, flu-like symptoms, shortness of breath, cough etc.) before then, please call [phone].  If you test positive for Covid 19 in the 2 weeks post procedure, please call and report this information to us.    If any biopsies were taken you will be contacted by phone or by letter within the next 1-3 weeks.  Please call us at [phone] if you have not heard about the biopsies in 3 weeks.    SIGNATURES/CONFIDENTIALITY: You and/or your care partner have signed paperwork which will be entered into your electronic medical record.  These signatures attest to the fact that that the information above on your After Visit Summary has been reviewed and is understood.  Full responsibility of the confidentiality of this discharge information lies with you and/or your care-partner. 

## 2021-12-10 NOTE — Progress Notes (Signed)
HISTORY OF PRESENT ILLNESS:  [name] [name] is a 76 y.o. male who recently underwent colonoscopy October 26, 2021.  At that time he reported dyspeptic symptoms.  He does have GERD and history of redo fundoplication.  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  He is now for EGD to evaluate dyspeptic symptoms, particular given his history  REVIEW OF SYSTEMS:  All non-GI ROS negative. Past Medical History:  Diagnosis Date   Asthma    uses inhaler daily and as needed   Blood transfusion 2012   surgery- esophageal bleeding secondary to hiatal hernia   Constipation    Diverticulitis    Diverticulosis    Eczema    Esophageal stricture    GERD (gastroesophageal reflux disease)    HBP (high blood pressure)    on meds   Hyperlipemia    on meds   Kidney stones    hx of   Seasonal allergies    Trouble swallowing    Ulcer     Past Surgical History:  Procedure Laterality Date   COLONOSCOPY  2012   DB-F/V-mira(good)-tics   HIATAL HERNIA REPAIR  1992&2012   LAPAROSCOPIC NISSEN FUNDOPLICATION  2482, 50/03/70   WISDOM TOOTH EXTRACTION      Social History [name] [name]  reports that he quit smoking about 40 years ago. His smoking use included cigarettes. He has never used smokeless tobacco. He reports that he does not currently use alcohol. He reports that he does not use drugs.  family history includes Heart disease (age of onset: 88) in his father.  Allergies  Allergen Reactions   Percocet [Oxycodone-Acetaminophen] Nausea And Vomiting   Sulfacetamide Sodium Hives       PHYSICAL EXAMINATION:  Vital signs: BP (!) 155/70    Pulse 64    Temp 98.2 F (36.8 C)    Ht 5\' 11"  (1.803 m)    Wt 185 lb (83.9 kg)    SpO2 97%    BMI 25.80 kg/m  General: Well-developed, well-nourished, no acute distress HEENT: Sclerae are anicteric, conjunctiva pink. Oral mucosa intact Lungs: Clear Heart: Regular Abdomen: soft, nontender, nondistended, no obvious ascites, no peritoneal signs, normal  bowel sounds. No organomegaly. Extremities: No edema Psychiatric: alert and oriented x3. Cooperative     ASSESSMENT:  1.  Dyspepsia 2.  GERD 3.  History of fundoplication with redo 4.  History of gastric ulcer with bleeding related to fundoplication   PLAN:   1.  Continue PPI 2.  EGD

## 2021-12-10 NOTE — Op Note (Signed)
[facility] Patient Name: [name] [name] Procedure Date: 12/10/2021 10:38 AM MRN: [id number] Endoscopist: Docia Chuck. Henrene Pastor , MD Age: 76 Referring MD:  Date of Birth: Sep 27, 1946 Gender: Male Account #: [id number] Procedure:                Upper GI endoscopy Indications:              Dyspepsia, Esophageal reflux. Prior fundoplication                            complicated by proximal gastric ulcer. Subsequent                            redo fundoplication. Medicines:                Monitored Anesthesia Care Procedure:                Pre-Anesthesia Assessment:                           - Prior to the procedure, a History and Physical                            was performed, and patient medications and                            allergies were reviewed. The patient's tolerance of                            previous anesthesia was also reviewed. The risks                            and benefits of the procedure and the sedation                            options and risks were discussed with the patient.                            All questions were answered, and informed consent                            was obtained. Prior Anticoagulants: The patient has                            taken no previous anticoagulant or antiplatelet                            agents. ASA Grade Assessment: II - A patient with                            mild systemic disease. After reviewing the risks                            and benefits, the patient was deemed in  satisfactory condition to undergo the procedure.                           After obtaining informed consent, the endoscope was                            passed under direct vision. Throughout the                            procedure, the patient's blood pressure, pulse, and                            oxygen saturations were monitored continuously. The                            Endoscope was introduced through the  mouth, and                            advanced to the second part of duodenum. The upper                            GI endoscopy was accomplished without difficulty.                            The patient tolerated the procedure well. Scope In: Scope Out: Findings:                 The esophagus was normal.                           The stomach was normal. Prior fundoplication intact.                           The examined duodenum was normal.                           The cardia and gastric fundus were normal on                            retroflexion. Complications:            No immediate complications. Estimated Blood Loss:     Estimated blood loss: none. Impression:               - Normal esophagus.                           - Normal stomach. Status post fundoplication.                            Intact.                           - Normal examined duodenum.                           - No specimens collected. Recommendation:           -  Patient has a contact number available for                            emergencies. The signs and symptoms of potential                            delayed complications were discussed with the                            patient. Return to normal activities tomorrow.                            Written discharge instructions were provided to the                            patient.                           - Resume previous diet.                           - Continue present medications. Docia Chuck. Henrene Pastor, MD 12/10/2021 11:02:39 AM This report has been signed electronically.

## 2021-12-10 NOTE — Progress Notes (Signed)
Sedate, gd SR, tolerated procedure well, VSS, report to RN 

## 2021-12-12 ENCOUNTER — Telehealth: Payer: Self-pay | Admitting: *Deleted

## 2021-12-12 NOTE — Telephone Encounter (Signed)
Left message on f/u call 

## 2021-12-17 ENCOUNTER — Other Ambulatory Visit: Payer: Self-pay | Admitting: Family

## 2021-12-24 ENCOUNTER — Telehealth: Payer: Self-pay

## 2021-12-24 NOTE — Telephone Encounter (Signed)
Patient called stating that he stopped the Rapaflo 1 week ago due to skin issues. He states he started back on Flomax which he had no issues with.

## 2022-01-04 NOTE — Patient Instructions (Addendum)
Mild persistent asthma Continue Flovent 110 mcg 2 puffs twice a day with spacer to help prevent cough and wheeze. Lets see if being on a daily inhaler will decrease the need for oral steroids Continue montelukast 10 mg once a day to help prevent cough and wheeze Continue albuterol 2 puffs every 4 hours as needed for cough, wheeze, tightness in chest,or shortness of breath. Continue the prednisone as prescribed by your primary care physician  Perennial allergic rhinitis Continue montelukast as above. Continue fluticasone nasal spray 2 sprays each nostril twice a day as needed for stuffy nose Continue azelastine nasal spray 1-2 sprays each nostril twice a day as needed for runny nose/drainage May use saline nasal spray or saline nasal rinse as needed for nasal symptoms. Use this prior to any medicated nasal sprays  Gastroesophageal reflux Continue dietary and lifestyle modifications Continue famotidine (Pepcid) 20 mg as needed  Please let us know if this treatment plan is not working well for you. Schedule a follow up appointment in 2-3 months or sooner if needed

## 2022-01-07 ENCOUNTER — Other Ambulatory Visit: Payer: Self-pay

## 2022-01-07 ENCOUNTER — Ambulatory Visit: Payer: PPO | Admitting: Family

## 2022-01-07 ENCOUNTER — Encounter: Payer: Self-pay | Admitting: Family

## 2022-01-07 VITALS — BP 140/90 | HR 64 | Temp 97.0°F | Resp 16 | Ht 71.0 in | Wt 191.0 lb

## 2022-01-07 DIAGNOSIS — J3089 Other allergic rhinitis: Secondary | ICD-10-CM

## 2022-01-07 DIAGNOSIS — K219 Gastro-esophageal reflux disease without esophagitis: Secondary | ICD-10-CM | POA: Diagnosis not present

## 2022-01-07 DIAGNOSIS — J4530 Mild persistent asthma, uncomplicated: Secondary | ICD-10-CM

## 2022-01-07 MED ORDER — AZELASTINE HCL 0.1 % NA SOLN: NASAL | 5 refills | Status: DC

## 2022-01-07 MED ORDER — FLUTICASONE PROPIONATE 50 MCG/ACT NA SUSP: NASAL | 3 refills | Status: DC

## 2022-01-07 MED ORDER — FLUTICASONE PROPIONATE HFA 110 MCG/ACT IN AERO: INHALATION_SPRAY | RESPIRATORY_TRACT | 5 refills | Status: DC

## 2022-01-07 MED ORDER — ALBUTEROL SULFATE HFA 108 (90 BASE) MCG/ACT IN AERS: INHALATION_SPRAY | RESPIRATORY_TRACT | 1 refills | Status: DC

## 2022-01-07 NOTE — Progress Notes (Signed)
[facility] [place] [address] Dept: [phone]  FOLLOW UP NOTE  Patient ID: [name] [name], male    DOB: [birth year]  Age: 76 y.o. MRN: [id number] Date of Office Visit: 01/07/2022  Assessment  Chief Complaint: Asthma (Covid x 3; RSV x 1 /)  HPI [name] [name] is a 76 year old male who presents today for follow-up mild persistent asthma and gastroesophageal reflux disease.  He was last seen on October 12, 2020 by Althea Charon, FNP.  Since his last office visit he reports that he has had COVID-19 three times and RSV 1 time.  He has only received 1 COVID-19 vaccine and that was back in December 2021.  Mild persistent asthma is reported as not well controlled with Singulair 10 mg once a day, Flovent 110 mcg 2 puffs twice a day with spacer, and prednisone as prescribed by his primary care physician.  He reports that he went to his primary care physician last Friday due to his asthma flaring.  He  has been having a dry cough, wheezing, tightness in his chest, and shortness of breath.  He denies nocturnal awakenings due to breathing problems, fever, and chills.  He was given 5 days total of prednisone and will finish up tomorrow.  He does mention that his breathing has gotten much better since starting the prednisone and Flovent 110 mcg.  Prior to seeing his primary care physician on Friday he had not started Flovent 110 mcg 2 puffs twice a day for asthma flare/respiratory tract infections.  Since his last office visit he has not made any trips to the emergency room or urgent care due to breathing problems.  He has been on 3-4 different rounds of steroids due to breathing problems since we last saw him.  Discussed the side effects of using steroids versus using a daily inhaled corticosteroid.  Perennial allergic rhinitis is reported as not well controlled with Singulair 10 mg once a day, Xyzal 5 mg once a day, Flonase nasal spray as needed, and Astelin nasal spray as needed.  He reports  that all the time he has clear rhinorrhea and nasal congestion.  He also reports postnasal drip.  He has not had any sinus infections since we last saw him.  He mentions that he does not like to take daily medications.  Reflux is reported as controlled with Pepcid 20 mg twice a day as needed.  He denies any heartburn or reflux symptoms.   Drug Allergies:  Allergies  Allergen Reactions   Percocet [Oxycodone-Acetaminophen] Nausea And Vomiting   Sulfacetamide Sodium Hives    Review of Systems: Review of Systems  Constitutional:  Negative for chills and fever.  HENT:         Reports rhinorrhea that is mostly clear, nasal congestion, and postnasal drip.  Eyes:        Denies itchy watery eyes  Respiratory:  Positive for cough, shortness of breath and wheezing.   Cardiovascular:  Negative for chest pain and palpitations.  Gastrointestinal:        Reports heartburn and reflux are good with famotidine as needed  Genitourinary:  Positive for frequency.       Reports that he is currently on Flomax  Skin:  Negative for itching and rash.  Neurological:  Negative for headaches.  Endo/Heme/Allergies:  Positive for environmental allergies.    Physical Exam: BP 140/90    Pulse 64    Temp (!) 97 F (36.1 C)    Resp 16  Ht 5\' 11"  (1.803 m)    Wt 191 lb (86.6 kg)    SpO2 99%    BMI 26.64 kg/m    Physical Exam Constitutional:      Appearance: Normal appearance.  HENT:     Head: Normocephalic and atraumatic.     Comments: Pharynx normal, eyes normal, ears normal, nose: Bilateral lower turbinates mildly edematous with no drainage noted    Right Ear: Tympanic membrane, ear canal and external ear normal.     Left Ear: Tympanic membrane, ear canal and external ear normal.     Mouth/Throat:     Mouth: Mucous membranes are moist.     Pharynx: Oropharynx is clear.  Eyes:     Conjunctiva/sclera: Conjunctivae normal.  Cardiovascular:     Rate and Rhythm: Normal rate and regular rhythm.     Heart  sounds: Normal heart sounds.  Pulmonary:     Effort: Pulmonary effort is normal.     Breath sounds: Normal breath sounds.     Comments: Lungs clear to auscultation Musculoskeletal:     Cervical back: Neck supple.  Skin:    General: Skin is warm.  Neurological:     Mental Status: He is alert and oriented to person, place, and time.  Psychiatric:        Mood and Affect: Mood normal.        Behavior: Behavior normal.        Thought Content: Thought content normal.        Judgment: Judgment normal.    Diagnostics: FVC 3.21 L, FEV1 2.23 L (70%).  Predicted FVC 4.26 L, predicted FEV1 3.19 spirometry indicates normal respiratory function.  Assessment and Plan: 1. Not well controlled mild persistent asthma   2. Perennial allergic rhinitis   3. Gastroesophageal reflux disease, unspecified whether esophagitis present     No orders of the defined types were placed in this encounter.   Patient Instructions  Mild persistent asthma Continue Flovent 110 mcg 2 puffs twice a day with spacer to help prevent cough and wheeze. Lets see if being on a daily inhaler will decrease the need for oral steroids Continue montelukast 10 mg once a day to help prevent cough and wheeze Continue albuterol 2 puffs every 4 hours as needed for cough, wheeze, tightness in chest,or shortness of breath. Continue the prednisone as prescribed by your primary care physician  Perennial allergic rhinitis Continue montelukast as above. Continue fluticasone nasal spray 2 sprays each nostril twice a day as needed for stuffy nose Continue azelastine nasal spray 1-2 sprays each nostril twice a day as needed for runny nose/drainage May use saline nasal spray or saline nasal rinse as needed for nasal symptoms. Use this prior to any medicated nasal sprays  Gastroesophageal reflux Continue dietary and lifestyle modifications Continue famotidine (Pepcid) 20 mg as needed  Please let us know if this treatment plan is not  working well for you. Schedule a follow up appointment in 2-3 months or sooner if needed  Return in about 2 months (around 03/07/2022), or if symptoms worsen or fail to improve.    Thank you for the opportunity to care for this patient.  Please do not hesitate to contact me with questions.  Althea Charon, FNP [facility] [place]

## 2022-02-10 ENCOUNTER — Other Ambulatory Visit: Payer: Self-pay | Admitting: Physician Assistant

## 2022-03-08 NOTE — Patient Instructions (Addendum)
Mild persistent asthma ?Continue Flovent 110 mcg 2 puffs twice a day with spacer to help prevent cough and wheeze.  ?Continue montelukast 10 mg once a day to help prevent cough and wheeze ?Continue albuterol 2 puffs every 4 hours as needed for cough, wheeze, tightness in chest,or shortness of breath. ? ?Perennial allergic rhinitis ?Continue montelukast as above. ?Continue fluticasone nasal spray 2 sprays each nostril twice a day as needed for stuffy nose ?Continue azelastine nasal spray 1-2 sprays each nostril twice a day as needed for runny nose/drainage ?May use saline nasal spray or saline nasal rinse as needed for nasal symptoms. Use this prior to any medicated nasal sprays ? ?Gastroesophageal reflux ?Continue dietary and lifestyle modifications ?Continue famotidine (Pepcid) 20 mg as needed ? ?Please let us know if this treatment plan is not working well for you. ?Schedule a follow up appointment in 4-6 months or sooner if needed ?

## 2022-03-11 ENCOUNTER — Encounter: Payer: Self-pay | Admitting: Family

## 2022-03-11 ENCOUNTER — Ambulatory Visit: Payer: PPO | Admitting: Family

## 2022-03-11 VITALS — BP 158/78 | HR 62 | Temp 98.3°F | Resp 16 | Ht 71.0 in | Wt 189.6 lb

## 2022-03-11 DIAGNOSIS — K219 Gastro-esophageal reflux disease without esophagitis: Secondary | ICD-10-CM

## 2022-03-11 DIAGNOSIS — J3089 Other allergic rhinitis: Secondary | ICD-10-CM | POA: Diagnosis not present

## 2022-03-11 DIAGNOSIS — J4530 Mild persistent asthma, uncomplicated: Secondary | ICD-10-CM | POA: Diagnosis not present

## 2022-03-11 MED ORDER — LEVOCETIRIZINE DIHYDROCHLORIDE 5 MG PO TABS: ORAL_TABLET | ORAL | 5 refills | Status: DC

## 2022-03-11 NOTE — Progress Notes (Signed)
? ?[place] [id number] ?Dept: [phone] ? ?FOLLOW UP NOTE ? ?Patient ID: [name] [name], male    DOB: 26-Mar-1946  Age: 76 y.o. MRN: [id number] ?Date of Office Visit: 03/11/2022 ? ?Assessment  ?Chief Complaint: Asthma (Doing much better. Hasn't been as bad as it has been.) and Allergic Rhinitis  (Better than normal.Kicks up if he is outside and he doesn't wear a mask. ) ? ?HPI ?[name] [name] is a 76 year old male who presents today for follow-up of not well controlled mild persistent asthma, perennial allergic rhinitis, and gastroesophageal reflux disease.  He was last seen on January 07, 2022 by myself.  Since his last office visit he denies any new diagnosis or surgeries. ? ?Mild persistent asthma is reported as doing better with Flovent 110 mcg 2 puffs twice a day with spacer, montelukast 10 mg once a day, and albuterol as needed.  He reports a little bit of wheezing, but not as bad as what it was last time.  He also reports shortness of breath if he is really exerting himself.  He denies coughing, tightness in chest, and nocturnal awakenings due to breathing problems.  Since his last office visit he has not made any trips to the emergency room or urgent care due to breathing problems and has not received any systemic steroids due to breathing problems.  He has used his albuterol approximately 3 times since his last office visit. ? ?Perennial allergic rhinitis is reported as not as bad as the years past.  He continues to take montelukast 10 mg once a day, fluticasone nasal spray as needed and azelastine nasal spray as needed.  He reports rhinorrhea and nasal congestion at times and postnasal drip all the time.  He has not had any sinus infections since we last saw him. ? ?Gastroesophageal reflux is reported as controlled with famotidine 20 mg as needed.  He denies any heartburn or reflux symptoms. ? ?Discussed how his blood pressure was elevated while in the office today.  Instructed him  to talk with his primary care physician about this on Friday along with his chest pains he has been having at times. ? ? ?Drug Allergies:  ?Allergies  ?Allergen Reactions  ? Percocet [Oxycodone-Acetaminophen] Nausea And Vomiting  ? Sulfacetamide Sodium Hives  ? ? ?Review of Systems: ?Review of Systems  ?Constitutional:  Negative for chills and fever.  ?HENT:    ?     Reports rhinorrhea and nasal congestion at times.  Also reports postnasal drip all the time.  ?Eyes:   ?     Occasional itchy watery eyes.  He does not wish to have an eyedrop to help.  ?Respiratory:  Positive for shortness of breath and wheezing. Negative for cough.   ?     Reports little bit of wheezing but not as bad as it was last time.  Also reports little shortness of breath if really exerting himself.  Denies coughing, tightness in chest, and nocturnal awakenings due to breathing problems  ?Cardiovascular:  Positive for chest pain. Negative for palpitations.  ?     He reports that he has been having some chest pain at times.  He reports sometimes it is from gas and sometimes he feels like it is coming from his heart.  He is supposed to have a physical this morning but it got changed to this coming Friday.  He is going to discuss this with his primary care physician.  ?Gastrointestinal:   ?  Denies heartburn or reflux symptoms  ?Genitourinary:  Negative for frequency.  ?Skin:  Positive for itching. Negative for rash.  ?     Reports itchy skin due to dry skin  ?Neurological:  Negative for headaches.  ?Endo/Heme/Allergies:  Positive for environmental allergies.  ? ? ?Physical Exam: ?BP (!) 158/78   Pulse 62   Temp 98.3 ?F (36.8 ?C) (Temporal)   Resp 16   Ht '5\' 11"'$  (1.803 m)   Wt 189 lb 9.6 oz (86 kg)   SpO2 100%   BMI 26.44 kg/m?   ? ?Physical Exam ?Constitutional:   ?   Appearance: Normal appearance.  ?HENT:  ?   Head: Normocephalic and atraumatic.  ?   Comments: Pharynx normal, eyes normal, ears normal, nose normal ?   Right Ear:  Tympanic membrane, ear canal and external ear normal.  ?   Left Ear: Tympanic membrane, ear canal and external ear normal.  ?   Nose: Nose normal.  ?   Mouth/Throat:  ?   Mouth: Mucous membranes are moist.  ?   Pharynx: Oropharynx is clear.  ?Eyes:  ?   Conjunctiva/sclera: Conjunctivae normal.  ?Cardiovascular:  ?   Rate and Rhythm: Normal rate and regular rhythm.  ?   Heart sounds: Normal heart sounds.  ?Pulmonary:  ?   Effort: Pulmonary effort is normal.  ?   Breath sounds: Normal breath sounds.  ?   Comments: Lungs clear to auscultation ?Musculoskeletal:  ?   Cervical back: Neck supple.  ?Skin: ?   General: Skin is warm.  ?Neurological:  ?   Mental Status: He is alert and oriented to person, place, and time.  ?Psychiatric:     ?   Mood and Affect: Mood normal.     ?   Behavior: Behavior normal.     ?   Thought Content: Thought content normal.     ?   Judgment: Judgment normal.  ? ? ?Diagnostics: ?FVC 3.16 L (74%), FEV1 2.30 L (72%.  Predicted FVC 4.25 L, predicted FEV1 3.18 L.  Spirometry indicates normal respiratory function. ? ?Assessment and Plan: ?1. Mild persistent asthma without complication   ?2. Perennial allergic rhinitis   ?3. Gastroesophageal reflux disease, unspecified whether esophagitis present   ? ? ?Meds ordered this encounter  ?Medications  ? levocetirizine (XYZAL) 5 MG tablet  ?  Sig: TAKE 1 TABLET BY MOUTH ONCE DAILY AS NEEDED FOR RUNNY NOSE  ?  Dispense:  30 tablet  ?  Refill:  5  ? ? ?Patient Instructions  ?Mild persistent asthma ?Continue Flovent 110 mcg 2 puffs twice a day with spacer to help prevent cough and wheeze.  ?Continue montelukast 10 mg once a day to help prevent cough and wheeze ?Continue albuterol 2 puffs every 4 hours as needed for cough, wheeze, tightness in chest,or shortness of breath. ? ?Perennial allergic rhinitis ?Continue montelukast as above. ?Continue fluticasone nasal spray 2 sprays each nostril twice a day as needed for stuffy nose ?Continue azelastine nasal spray  1-2 sprays each nostril twice a day as needed for runny nose/drainage ?May use saline nasal spray or saline nasal rinse as needed for nasal symptoms. Use this prior to any medicated nasal sprays ? ?Gastroesophageal reflux ?Continue dietary and lifestyle modifications ?Continue famotidine (Pepcid) 20 mg as needed ? ?Please let us know if this treatment plan is not working well for you. ?Schedule a follow up appointment in 4-6 months or sooner if needed ?Return in about 6 months (around  09/10/2022), or if symptoms worsen or fail to improve. ?  ? ?Thank you for the opportunity to care for this patient.  Please do not hesitate to contact me with questions. ? ?Althea Charon, FNP ?[facility] ? ? ? ? ?

## 2022-03-25 ENCOUNTER — Other Ambulatory Visit: Payer: Self-pay

## 2022-03-25 DIAGNOSIS — E785 Hyperlipidemia, unspecified: Secondary | ICD-10-CM

## 2022-04-19 ENCOUNTER — Other Ambulatory Visit: Payer: PPO

## 2022-04-26 ENCOUNTER — Ambulatory Visit
Admission: RE | Admit: 2022-04-26 | Discharge: 2022-04-26 | Disposition: A | Discharge status: Home / Self Care | Payer: No Typology Code available for payment source | Source: Ambulatory Visit

## 2022-04-26 DIAGNOSIS — E785 Hyperlipidemia, unspecified: Secondary | ICD-10-CM

## 2022-05-11 ENCOUNTER — Other Ambulatory Visit: Payer: Self-pay | Admitting: Physician Assistant

## 2022-05-14 ENCOUNTER — Other Ambulatory Visit: Payer: Self-pay

## 2022-05-14 MED ORDER — TAMSULOSIN HCL 0.4 MG PO CAPS: ORAL_CAPSULE | ORAL | 3 refills | Status: DC

## 2022-05-31 ENCOUNTER — Ambulatory Visit: Payer: PPO | Admitting: Urology

## 2022-05-31 ENCOUNTER — Encounter: Payer: Self-pay | Admitting: Urology

## 2022-05-31 VITALS — BP 131/79 | HR 68

## 2022-05-31 DIAGNOSIS — N138 Other obstructive and reflux uropathy: Secondary | ICD-10-CM | POA: Diagnosis not present

## 2022-05-31 DIAGNOSIS — R3912 Poor urinary stream: Secondary | ICD-10-CM

## 2022-05-31 DIAGNOSIS — N401 Enlarged prostate with lower urinary tract symptoms: Secondary | ICD-10-CM | POA: Diagnosis not present

## 2022-05-31 DIAGNOSIS — R351 Nocturia: Secondary | ICD-10-CM | POA: Diagnosis not present

## 2022-05-31 LAB — BLADDER SCAN AMB NON-IMAGING: Scan Result: >166

## 2022-05-31 MED ORDER — TAMSULOSIN HCL 0.4 MG PO CAPS: ORAL_CAPSULE | ORAL | 3 refills | Status: DC

## 2022-05-31 NOTE — Progress Notes (Signed)
05/31/2022 9:29 AM   [name] [name] 03-30-1946 [id number]  Referring provider: No referring provider defined for this encounter.  Followup BPH   HPI: Mr [name] [name] is 76yo here for followup for BPH and weak stream. He is back on flomax due to side effects. IPSS 12 QOL 2. Urine stream strong. Nocturia 1-2x. No other complaints.    PMH: Past Medical History:  Diagnosis Date   Asthma    uses inhaler daily and as needed   Blood transfusion 2012   surgery- esophageal bleeding secondary to hiatal hernia   Constipation    Diverticulitis    Diverticulosis    Eczema    Esophageal stricture    GERD (gastroesophageal reflux disease)    HBP (high blood pressure)    on meds   Hyperlipemia    on meds   Kidney stones    hx of   Seasonal allergies    Trouble swallowing    Ulcer     Surgical History: Past Surgical History:  Procedure Laterality Date   COLONOSCOPY  2012   DB-F/V-mira(good)-tics   HIATAL HERNIA REPAIR  1992&2012   LAPAROSCOPIC NISSEN FUNDOPLICATION  3825, 05/39/76   WISDOM TOOTH EXTRACTION      Home Medications:  Allergies as of 05/31/2022       Reactions   Percocet [oxycodone-acetaminophen] Nausea And Vomiting   Sulfacetamide Sodium Hives        Medication List        Accurate as of May 31, 2022  9:29 AM. If you have any questions, ask your nurse or doctor.          acetaminophen 500 MG tablet Commonly known as: TYLENOL Take 1,000 mg by mouth at bedtime as needed (pain).   azelastine 0.1 % nasal spray Commonly known as: ASTELIN Use 1- 2 sprays each nostril twice daily as needed for runny nose/drainage.   diphenhydrAMINE 25 MG tablet Commonly known as: BENADRYL Take 25 mg by mouth at bedtime as needed for allergies.   famotidine 20 MG tablet Commonly known as: Pepcid Take 1 tablet (20 mg total) by mouth 2 (two) times daily. As needed.   fluticasone 110 MCG/ACT inhaler Commonly known as: Flovent HFA 2 puffs twice a day with spacer to  help prevent cough and wheeze.   fluticasone 50 MCG/ACT nasal spray Commonly known as: FLONASE Use 2 sprays each nostril once a day as needed for stuffy nose   guaiFENesin 600 MG 12 hr tablet Commonly known as: MUCINEX 1 tablet as needed   guaiFENesin-codeine 100-10 MG/5ML syrup Commonly known as: ROBITUSSIN AC codeine 10 mg-guaifenesin 100 mg/5 mL oral liquid  TAKE 5 MLS (ONE TEASPOON) AS NEEDED ORALLY EVERY 6 HRS   hydroxychloroquine 200 MG tablet Commonly known as: PLAQUENIL hydroxychloroquine 200 mg tablet  TAKE 1 TABLET BY MOUTH TWICE A DAY   levocetirizine 5 MG tablet Commonly known as: XYZAL TAKE 1 TABLET BY MOUTH ONCE DAILY AS NEEDED FOR RUNNY NOSE   losartan 25 MG tablet Commonly known as: COZAAR Take 2 tablets by mouth daily.   losartan 25 MG tablet Commonly known as: COZAAR Take 50 mg by mouth daily.   montelukast 10 MG tablet Commonly known as: SINGULAIR TAKE 1 TABLET BY MOUTH EVERYDAY AT BEDTIME What changed:  how much to take how to take this when to take this reasons to take this   ondansetron 8 MG disintegrating tablet Commonly known as: ZOFRAN-ODT ondansetron 8 mg disintegrating tablet  DISSOLVE 1 TABLET ON THE  TONGUE EVERY 6 HOURS AS NEEDED FOR NAUSEA   pantoprazole 40 MG tablet Commonly known as: PROTONIX TAKE 1 TABLET BY MOUTH EVERY DAY What changed:  when to take this reasons to take this   predniSONE 20 MG tablet Commonly known as: DELTASONE Take 2 tablets by mouth daily at 12 noon.   tamsulosin 0.4 MG Caps capsule Commonly known as: FLOMAX TAKE ONE CAPSULE BY MOUTH DAILY AT BEDTIME   triamcinolone cream 0.1 % Commonly known as: KENALOG SMARTSIG:1 Application Topical 2-3 Times Daily   Ventolin HFA 108 (90 Base) MCG/ACT inhaler Generic drug: albuterol Inhale 1-2 puffs into the lungs every 6 (six) hours as needed for wheezing or shortness of breath.   VITAMIN D PO Take 1 tablet by mouth daily.        Allergies:   Allergies  Allergen Reactions   Percocet [Oxycodone-Acetaminophen] Nausea And Vomiting   Sulfacetamide Sodium Hives    Family History: Family History  Problem Relation Age of Onset   Heart disease Father 17       heart attack   Colon cancer Neg Hx    Allergic rhinitis Neg Hx    Angioedema Neg Hx    Asthma Neg Hx    Eczema Neg Hx    Immunodeficiency Neg Hx    Urticaria Neg Hx    Colon polyps Neg Hx    Esophageal cancer Neg Hx    Rectal cancer Neg Hx    Stomach cancer Neg Hx     Social History:  reports that he quit smoking about 40 years ago. His smoking use included cigarettes. He has never used smokeless tobacco. He reports that he does not currently use alcohol. He reports that he does not use drugs.  ROS: All other review of systems were reviewed and are negative except what is noted above in HPI  Physical Exam: BP 131/79   Pulse 68   Constitutional:  Alert and oriented, No acute distress. HEENT: [place] AT, moist mucus membranes.  Trachea midline, no masses. Cardiovascular: No clubbing, cyanosis, or edema. Respiratory: Normal respiratory effort, no increased work of breathing. GI: Abdomen is soft, nontender, nondistended, no abdominal masses GU: No CVA tenderness.  Lymph: No cervical or inguinal lymphadenopathy. Skin: No rashes, bruises or suspicious lesions. Neurologic: Grossly intact, no focal deficits, moving all 4 extremities. Psychiatric: Normal mood and affect.  Laboratory Data: Lab Results  Component Value Date   WBC 7.6 10/15/2015   HGB 14.0 10/15/2015   HCT 41.5 10/15/2015   MCV 91.8 10/15/2015   PLT 183 10/15/2015    Lab Results  Component Value Date   CREATININE 1.14 10/15/2015    No results found for: "PSA"  No results found for: "TESTOSTERONE"  No results found for: "HGBA1C"  Urinalysis    Component Value Date/Time   COLORURINE YELLOW 10/15/2015 2004   APPEARANCEUR Clear 11/30/2021 1008   LABSPEC 1.015 10/15/2015 2004   PHURINE 6.5  10/15/2015 2004   GLUCOSEU Negative 11/30/2021 1008   HGBUR NEGATIVE 10/15/2015 2004   BILIRUBINUR Negative 11/30/2021 [place] 10/15/2015 2004   PROTEINUR Negative 11/30/2021 [place] 10/15/2015 2004   UROBILINOGEN 0.2 08/27/2011 1750   NITRITE Negative 11/30/2021 1008   NITRITE NEGATIVE 10/15/2015 2004   LEUKOCYTESUR Trace (A) 11/30/2021 1008    Lab Results  Component Value Date   LABMICR See below: 11/30/2021   WBCUA 0-5 11/30/2021   LABEPIT 0-10 11/30/2021   BACTERIA None seen 11/30/2021  Pertinent Imaging:  No results found for this or any previous visit.  No results found for this or any previous visit.  No results found for this or any previous visit.  No results found for this or any previous visit.  No results found for this or any previous visit.  No results found for this or any previous visit.  No results found for this or any previous visit.  No results found for this or any previous visit.   Assessment & Plan:    1. Benign prostatic hyperplasia with urinary obstruction -Increase flomax to BID - Urinalysis, Routine w reflex microscopic - BLADDER SCAN AMB NON-IMAGING  2. Weak urinary stream -Increase flomax to BID  3. Nocturia -Increase flomax to BID   No follow-ups on file.  Nicolette Bang, MD  [facility] [place]

## 2022-05-31 NOTE — Patient Instructions (Signed)

## 2022-06-30 ENCOUNTER — Other Ambulatory Visit: Payer: Self-pay | Admitting: Family

## 2022-06-30 NOTE — Telephone Encounter (Signed)
Ok to send prescription with 5 refills.

## 2022-07-01 ENCOUNTER — Other Ambulatory Visit: Payer: Self-pay

## 2022-07-01 DIAGNOSIS — J45909 Unspecified asthma, uncomplicated: Secondary | ICD-10-CM | POA: Insufficient documentation

## 2022-07-01 DIAGNOSIS — N400 Enlarged prostate without lower urinary tract symptoms: Secondary | ICD-10-CM | POA: Insufficient documentation

## 2022-07-01 DIAGNOSIS — E785 Hyperlipidemia, unspecified: Secondary | ICD-10-CM | POA: Insufficient documentation

## 2022-07-01 DIAGNOSIS — I129 Hypertensive chronic kidney disease with stage 1 through stage 4 chronic kidney disease, or unspecified chronic kidney disease: Secondary | ICD-10-CM | POA: Insufficient documentation

## 2022-07-01 DIAGNOSIS — Z720 Tobacco use: Secondary | ICD-10-CM | POA: Insufficient documentation

## 2022-07-01 DIAGNOSIS — Z87442 Personal history of urinary calculi: Secondary | ICD-10-CM | POA: Insufficient documentation

## 2022-07-01 DIAGNOSIS — N182 Chronic kidney disease, stage 2 (mild): Secondary | ICD-10-CM | POA: Insufficient documentation

## 2022-07-01 DIAGNOSIS — N529 Male erectile dysfunction, unspecified: Secondary | ICD-10-CM | POA: Insufficient documentation

## 2022-07-01 DIAGNOSIS — E559 Vitamin D deficiency, unspecified: Secondary | ICD-10-CM | POA: Insufficient documentation

## 2022-07-01 DIAGNOSIS — M722 Plantar fascial fibromatosis: Secondary | ICD-10-CM | POA: Insufficient documentation

## 2022-07-01 DIAGNOSIS — Z8616 Personal history of COVID-19: Secondary | ICD-10-CM | POA: Insufficient documentation

## 2022-07-01 DIAGNOSIS — R911 Solitary pulmonary nodule: Secondary | ICD-10-CM | POA: Insufficient documentation

## 2022-08-11 NOTE — Patient Instructions (Incomplete)
Mild persistent asthma Continue Flovent 110 mcg 2 puffs twice a day with spacer to help prevent cough and wheeze.  Continue montelukast 10 mg once a day to help prevent cough and wheeze Continue albuterol 2 puffs every 4 hours as needed for cough, wheeze, tightness in chest,or shortness of breath. Asthma control goals:  Full participation in all desired activities (may need albuterol before activity) Albuterol use two time or less a week on average (not counting use with activity) Cough interfering with sleep two time or less a month Oral steroids no more than once a year No hospitalizations   Perennial allergic rhinitis Continue montelukast as above. Continue fluticasone nasal spray 2 sprays each nostril twice a day as needed for stuffy nose Continue azelastine nasal spray 1-2 sprays each nostril twice a day as needed for runny nose/drainage May use saline nasal spray or saline nasal rinse as needed for nasal symptoms. Use this prior to any medicated nasal sprays  Gastroesophageal reflux Continue dietary and lifestyle modifications Continue famotidine (Pepcid) 20 mg as needed  Please let us know if this treatment plan is not working well for you. Schedule a follow up appointment in  months or sooner if needed

## 2022-08-12 ENCOUNTER — Ambulatory Visit: Payer: PPO | Admitting: Family

## 2022-08-12 ENCOUNTER — Encounter: Payer: Self-pay | Admitting: Family

## 2022-08-12 VITALS — BP 132/74 | HR 71 | Temp 98.1°F | Resp 12 | Wt 183.4 lb

## 2022-08-12 DIAGNOSIS — J4530 Mild persistent asthma, uncomplicated: Secondary | ICD-10-CM

## 2022-08-12 DIAGNOSIS — J3089 Other allergic rhinitis: Secondary | ICD-10-CM

## 2022-08-12 DIAGNOSIS — K219 Gastro-esophageal reflux disease without esophagitis: Secondary | ICD-10-CM | POA: Diagnosis not present

## 2022-08-12 DIAGNOSIS — J019 Acute sinusitis, unspecified: Secondary | ICD-10-CM

## 2022-08-12 MED ORDER — DOXYCYCLINE MONOHYDRATE 100 MG PO TABS: 100.0000 mg | ORAL_TABLET | Freq: Two times a day (BID) | ORAL | 0 refills | Status: DC

## 2022-08-12 NOTE — Progress Notes (Signed)
[place] [address] Dept: [phone]  FOLLOW UP NOTE  Patient ID: [name] [name], male    DOB: 1946-10-04  Age: 76 y.o. MRN: [id number] Date of Office Visit: 08/12/2022  Assessment  Chief Complaint: Follow-up (Asthma has been messed up for the last 3 weeks. Has had a head cold, nasal drainage. Taking his general medication. Has cough in office today.)  HPI [name] [name] is a 76 year old male who presents today for follow-up of mild persistent asthma without complication, perennial allergic rhinitis, and gastroesophageal reflux disease.  He was last seen on 03/11/2022 by myself.  He denies any new diagnosis or surgery since his last office visit.  He does mention that he recently had a cardiac scan that was ordered by his primary care physician and that he has an appointment in the next couple weeks to schedule repeat testing due to abnormal finding in his lungs found on the cardiac scan.  Mild persistent asthma: He is currently taking Flovent 110 mcg 2 puffs twice a day with spacer, Singulair 10 mg once a day, and albuterol as needed.  He reports cough due to drainage.  The cough will wake him up at night.  He also has shortness of breath that is the same as usual.  He denies wheezing, tightness in his chest, fever, and chills.  Since his last office visit he has not required any systemic steroids or made any trips to the emergency room or urgent care due to breathing problems.  He has used his albuterol inhaler 3 times the past week and this has helped with his symptoms.  Perennial allergic rhinitis: He is currently taking montelukast 10 mg once a day, fluticasone nasal spray daily, azelastine nasal spray daily, and saline rinse as needed.  He reports for the past 3 weeks his allergies have been bothering him.  He reports postnasal drip that has been green in color for the past week, nasal congestion, and clear rhinorrhea.  He has not been treated for any sinus infections  since we last saw him, but feels like he might have one now.  Gastroesophageal reflux disease: He reports his reflux symptoms have been good and he has not really had any issues.  He does have famotidine 20 mg to take as needed, but he has not really needed to take this.   Drug Allergies:  Allergies  Allergen Reactions   Percocet [Oxycodone-Acetaminophen] Nausea And Vomiting   Sulfacetamide Sodium Hives    Review of Systems: Review of Systems  Constitutional:  Negative for chills and fever.  HENT:         Reports green postnasal drip for the past week, clear rhinorrhea, and nasal congestion  Eyes:        Reports itchy watery eyes.  Does not wish to use an eyedrop at this time  Respiratory:  Positive for cough and shortness of breath. Negative for wheezing.        Reports cough due to postnasal drip and shortness of breath that is no worse since his last office visit.  Denies wheezing and tightness in his chest.  The cough does keep him up at night  Cardiovascular:  Negative for chest pain and palpitations.       Reports that he has recently had a cardiac scan and has a follow-up in a week or so  Gastrointestinal:        Denies heartburn or reflux symptoms.  Uses famotidine as needed  Genitourinary:  Negative for frequency.       Reports history of benign prostate hypertrophy.  Currently on Flomax  Skin:  Negative for itching and rash.  Neurological:  Negative for headaches.  Endo/Heme/Allergies:  Positive for environmental allergies.     Physical Exam: BP 132/74   Pulse 71   Temp 98.1 F (36.7 C) (Temporal)   Resp 12   Wt 183 lb 6.4 oz (83.2 kg)   SpO2 98%   BMI 25.58 kg/m    Physical Exam Constitutional:      Appearance: Normal appearance.  HENT:     Head: Normocephalic and atraumatic.     Comments: Pharynx normal, eyes normal, ears normal, nose: Bilateral lower turbinates mildly edematous and slightly erythematous with no drainage noted    Right Ear: Tympanic  membrane, ear canal and external ear normal.     Left Ear: Tympanic membrane, ear canal and external ear normal.     Mouth/Throat:     Mouth: Mucous membranes are moist.     Pharynx: Oropharynx is clear.  Eyes:     Conjunctiva/sclera: Conjunctivae normal.  Cardiovascular:     Rate and Rhythm: Normal rate and regular rhythm.     Heart sounds: Normal heart sounds.  Pulmonary:     Effort: Pulmonary effort is normal.     Breath sounds: Normal breath sounds.     Comments: Lungs clear to auscultation Musculoskeletal:     Cervical back: Neck supple.  Skin:    General: Skin is warm.  Neurological:     Mental Status: He is alert and oriented to person, place, and time.  Psychiatric:        Mood and Affect: Mood normal.        Behavior: Behavior normal.        Thought Content: Thought content normal.        Judgment: Judgment normal.     Diagnostics: FVC 3.22 L (75.94%), FEV1 2.26 L (71.29%).  Spirometry indicates normal respiratory function.  CLINICAL DATA:  76 year old Caucasian male with history of hypertension, hyperlipidemia and prior history of smoking. Evaluate for coronary artery disease.   EXAM: CT CARDIAC CORONARY ARTERY CALCIUM SCORE on 04/26/22      "IMPRESSION: 1. Patient's total coronary artery calcium score is 270 which is 56th percentile for patient's of matched age, gender and race/ethnicity. Please note that although the presence of coronary artery calcium documents the presence of coronary artery disease, the severity of this disease and any potential stenosis cannot be assessed on this noncontrast CT examination. Assessment for potential risk factor modification, dietary therapy or pharmacologic therapy may be warranted, if clinically indicated. 2. Nodular appearing area of architectural distortion in the lingula on axial images appears rather linear on coronal and sagittal reformats. This could suggest a benign area of evolving post infectious or  inflammatory scarring, however, this is new compared to the prior examination from 2018. Repeat noncontrast chest CT is recommended in 3 months to ensure the stability or regression of this finding. This recommendation follows the consensus statement: Guidelines for Management of Incidental Pulmonary Nodules Detected on CT Images: From the Fleischner Society 2017; Radiology 2017; 284:228-243. 3.  Aortic Atherosclerosis (ICD10-I70.0)."         Assessment and Plan: 1. Mild persistent asthma without complication   2. Acute non-recurrent sinusitis, unspecified location   3. Perennial allergic rhinitis   4. Gastroesophageal reflux disease, unspecified whether esophagitis present     Meds ordered this encounter  Medications  doxycycline (ADOXA) 100 MG tablet    Sig: Take 1 tablet (100 mg total) by mouth 2 (two) times daily.    Dispense:  14 tablet    Refill:  0    Patient Instructions  Mild persistent asthma Continue Flovent 110 mcg 2 puffs twice a day with spacer to help prevent cough and wheeze.  Continue montelukast 10 mg once a day to help prevent cough and wheeze Continue albuterol 2 puffs every 4 hours as needed for cough, wheeze, tightness in chest,or shortness of breath. Asthma control goals:  Full participation in all desired activities (may need albuterol before activity) Albuterol use two time or less a week on average (not counting use with activity) Cough interfering with sleep two time or less a month Oral steroids no more than once a year No hospitalizations   Perennial allergic rhinitis Continue montelukast as above. Continue fluticasone nasal spray 2 sprays each nostril twice a day as needed for stuffy nose Continue azelastine nasal spray 1-2 sprays each nostril twice a day as needed for runny nose/drainage May use saline nasal spray or saline nasal rinse as needed for nasal symptoms. Use this prior to any medicated nasal sprays  Gastroesophageal  reflux Continue dietary and lifestyle modifications Continue famotidine (Pepcid) 20 mg as needed  Acute sinus infection Start doxycycline 100 mg taking 1 tablet twice a day for 7 days. Discussed that I am not sending in Augmentin due to his history of chronic kidney disease and not having a recent CMP to review. He is ok with taking doxycycline Start prednisone 10 mg taking 1 tablet twice a day for 4 days, then on the 5th day take 1 tablet and stop   Please let us know if this treatment plan is not working well for you. Schedule a follow up appointment in 6 months or sooner if needed Return in about 6 months (around 02/10/2023), or if symptoms worsen or fail to improve.    Thank you for the opportunity to care for this patient.  Please do not hesitate to contact me with questions.  Althea Charon, FNP [facility]

## 2022-08-19 ENCOUNTER — Ambulatory Visit: Payer: PPO | Admitting: Family

## 2022-08-26 ENCOUNTER — Other Ambulatory Visit: Payer: Self-pay

## 2022-08-26 ENCOUNTER — Other Ambulatory Visit (HOSPITAL_COMMUNITY): Payer: Self-pay

## 2022-08-26 DIAGNOSIS — R9389 Abnormal findings on diagnostic imaging of other specified body structures: Secondary | ICD-10-CM

## 2022-09-02 ENCOUNTER — Encounter: Payer: Self-pay | Admitting: Family Medicine

## 2022-09-02 ENCOUNTER — Ambulatory Visit: Payer: PPO | Admitting: Family Medicine

## 2022-09-02 ENCOUNTER — Telehealth: Payer: Self-pay

## 2022-09-02 ENCOUNTER — Ambulatory Visit (HOSPITAL_COMMUNITY)
Admission: RE | Admit: 2022-09-02 | Discharge: 2022-09-02 | Disposition: A | Discharge status: Home / Self Care | Payer: PPO | Source: Ambulatory Visit

## 2022-09-02 VITALS — BP 136/78 | HR 82 | Temp 97.9°F | Resp 16 | Ht 71.0 in | Wt 186.0 lb

## 2022-09-02 DIAGNOSIS — K219 Gastro-esophageal reflux disease without esophagitis: Secondary | ICD-10-CM | POA: Diagnosis not present

## 2022-09-02 DIAGNOSIS — R052 Subacute cough: Secondary | ICD-10-CM | POA: Diagnosis not present

## 2022-09-02 DIAGNOSIS — J3089 Other allergic rhinitis: Secondary | ICD-10-CM | POA: Diagnosis not present

## 2022-09-02 DIAGNOSIS — J4530 Mild persistent asthma, uncomplicated: Secondary | ICD-10-CM

## 2022-09-02 DIAGNOSIS — R9389 Abnormal findings on diagnostic imaging of other specified body structures: Secondary | ICD-10-CM | POA: Diagnosis present

## 2022-09-02 NOTE — Telephone Encounter (Signed)
Appointment has been made with Webb Silversmith today at 11 AM to address this issue.

## 2022-09-02 NOTE — Telephone Encounter (Signed)
Please have him schedule an appointment. I think that [name] [name] has some openings this morning and one this afternoon.

## 2022-09-02 NOTE — Telephone Encounter (Signed)
Patient called stating he is still having issues with coughing and would like more prednisone sent in to alleviate symptoms. Please advise on what you would like the patient to do. Patient sounded clear while talking on the phone.    He would like [place] to call him back on his cell phone: [phone]

## 2022-09-02 NOTE — Patient Instructions (Addendum)
Mild persistent asthma Continue Flovent 110 mcg 2 puffs twice a day with spacer to help prevent cough and wheeze.  Continue montelukast 10 mg once a day to help prevent cough and wheeze Continue albuterol 2 puffs every 4 hours as needed for cough, wheeze, tightness in chest,or shortness of breath. Get the scheduled CT scan of your chest today.  Asthma control goals:  Full participation in all desired activities (may need albuterol before activity) Albuterol use two time or less a week on average (not counting use with activity) Cough interfering with sleep two time or less a month Oral steroids no more than once a year No hospitalizations   Perennial allergic rhinitis Continue montelukast as above. Continue fluticasone nasal spray 2 sprays each nostril twice a day as needed for stuffy nose Prednisone 10 mg tablets. Take 2 tablets once a day for 4 days, then take 1 tablet on the 5th day, then stop.   Begin saline nasal rinses as needed for nasal symptoms. Use this before any medicated nasal sprays for best result For the next few days hold your oral antihistamine as well as azelastine nasal spray.  Begin Mucinex [phone] mg twice a day and increase your fluid intake to thin out mucus Consider updating your allergy skin testing. Remember to stop antihistamines for 3 days before the testing If your symptoms are not well controlled with the treatment plan as listed above, consider allergen immunotherapy  Gastroesophageal reflux Continue dietary and lifestyle modifications Begin famotidine 20 mg twice a day for now  Cough Prednisone taper as listed above Antihistamines for about 1 week and increase fluid intake Begin Mucinex 600 to 1200 mg twice a day Get the chest CT as previously scheduled.  I will call you if you need any further intervention for coughing based on chest CT results Please let us know if this treatment plan is not working well for you.  Follow up in 2 months or sooner  if needed.

## 2022-09-02 NOTE — Progress Notes (Signed)
[place] [address] Dept: [phone]  FOLLOW UP NOTE  Patient ID: [name] [name], male    DOB: 1946-09-26  Age: 76 y.o. MRN: [id number] Date of Office Visit: 09/02/2022  Assessment  Chief Complaint: Cough (Follow up - LOV: 08/12/22  Patient states he still has a lingering cough that is very worrisome during the day - fine at night.)  HPI [name] [name] is a 76 year old male who presents to the clinic for evaluation of cough.  He was last seen in this clinic on 08/12/2022 by Althea Charon, FNP, for evaluation of asthma, allergic rhinitis, reflux, and acute sinusitis requiring doxycycline and prednisone. At today's visit, he reports that since taking prednisone and Doxycycline at his last visit,  He reports that, while his other symptoms have cleared, he continues to experience cough with clear mucus production occurring only during the daytime. Asthma is reported as moderately well controlled with occasional wheeze and cough. He denies shortness of breath with activity and rest. He continues montelukast 10 mg once a day, Flovent 110-2 puffs twice a day with a spacer, and albuterol as needed with mild relief of cough. He denies fever, sweats, chills or sick contacts. Allergic rhinitis is reported as moderately well controlled with symptoms including clear rhinorrhea, sneezing, and postnasal drainage with frequent cough and throat clearing.  He continues a daily antihistamine, daily Flonase, daily azelastine nasal spray.  He is not currently using a nasal saline rinse.  He denies any new exposure to pets, carpeting, new hobby, or new living environment.  His last environmental allergy skin testing was on 02/24/2017 and was positive to mold, cockroach, and dust mite on intradermal testing.  Reflux is reported as well controlled with no symptoms including heartburn or vomiting.  He reports that he occasionally takes famotidine with relief of symptoms.  His current medications are listed in  the chart. Of note, chart review indicates CT exam for cardiac coronary artery calcium score on 04/26/2022 with a possible postinfectious or inflammatory scarring in the lingular area.  [facility] has an appointment today for a follow-up chest CT that we will provide material for comparison.    Drug Allergies:  Allergies  Allergen Reactions   Percocet [Oxycodone-Acetaminophen] Nausea And Vomiting   Sulfacetamide Sodium Hives    Physical Exam: BP 136/78   Pulse 82   Temp 97.9 F (36.6 C)   Resp 16   Ht '5\' 11"'$  (1.803 m)   Wt 186 lb (84.4 kg)   SpO2 98%   BMI 25.94 kg/m    Physical Exam Vitals reviewed.  Constitutional:      Appearance: Normal appearance.  HENT:     Head: Normocephalic and atraumatic.     Right Ear: Tympanic membrane normal.     Left Ear: Tympanic membrane normal.     Nose:     Comments: Bilateral nares slightly erythematous with thin clear nasal drainage noted.  Pharynx erythematous with no exudate.  Ears normal.  Eyes normal. Eyes:     Conjunctiva/sclera: Conjunctivae normal.  Cardiovascular:     Rate and Rhythm: Normal rate and regular rhythm.  Pulmonary:     Effort: Pulmonary effort is normal.     Breath sounds: Normal breath sounds.     Comments: Scattered rhonchi that mostly clear with cough Musculoskeletal:        General: Normal range of motion.     Cervical back: Normal range of motion.  Skin:    General: Skin is warm  and dry.  Neurological:     Mental Status: He is alert and oriented to person, place, and time.  Psychiatric:        Mood and Affect: Mood normal.        Behavior: Behavior normal.        Thought Content: Thought content normal.        Judgment: Judgment normal.    Diagnostics: Perform spirometry due to cough  Assessment and Plan: 1. Mild persistent asthma without complication   2. Gastroesophageal reflux disease, unspecified whether esophagitis present   3. Perennial allergic rhinitis   4. Subacute cough     Patient  Instructions  Mild persistent asthma Continue Flovent 110 mcg 2 puffs twice a day with spacer to help prevent cough and wheeze.  Continue montelukast 10 mg once a day to help prevent cough and wheeze Continue albuterol 2 puffs every 4 hours as needed for cough, wheeze, tightness in chest,or shortness of breath. Get the scheduled CT scan of your chest today.  Asthma control goals:  Full participation in all desired activities (may need albuterol before activity) Albuterol use two time or less a week on average (not counting use with activity) Cough interfering with sleep two time or less a month Oral steroids no more than once a year No hospitalizations   Perennial allergic rhinitis Continue montelukast as above. Continue fluticasone nasal spray 2 sprays each nostril twice a day as needed for stuffy nose Prednisone 10 mg tablets. Take 2 tablets once a day for 4 days, then take 1 tablet on the 5th day, then stop.   Begin saline nasal rinses as needed for nasal symptoms. Use this before any medicated nasal sprays for best result For the next few days hold your oral antihistamine as well as azelastine nasal spray.  Begin Mucinex [phone] mg twice a day and increase your fluid intake to thin out mucus Consider updating your allergy skin testing. Remember to stop antihistamines for 3 days before the testing If your symptoms are not well controlled with the treatment plan as listed above, consider allergen immunotherapy  Gastroesophageal reflux Continue dietary and lifestyle modifications Begin famotidine 20 mg twice a day for now  Cough Prednisone taper as listed above Antihistamines for about 1 week and increase fluid intake Begin Mucinex 600 to 1200 mg twice a day Get the chest CT as previously scheduled.  I will call you if you need any further intervention for coughing based on chest CT results Please let us know if this treatment plan is not working well for you.  Follow up in 2  months or sooner if needed.   Return in about 2 months (around 11/02/2022), or if symptoms worsen or fail to improve.    Thank you for the opportunity to care for this patient.  Please do not hesitate to contact me with questions.  Gareth Morgan, FNP [facility]

## 2022-09-06 ENCOUNTER — Other Ambulatory Visit: Payer: Self-pay | Admitting: Family

## 2022-09-06 ENCOUNTER — Telehealth: Payer: Self-pay | Admitting: *Deleted

## 2022-09-06 NOTE — Telephone Encounter (Signed)
Patient called in regards to the Chest CT results that he obtained this Monday 09/02/2022. He stated that you were going to review them and let him know about the results.

## 2022-09-09 ENCOUNTER — Other Ambulatory Visit: Payer: Self-pay | Admitting: Family Medicine

## 2022-09-09 MED ORDER — AZITHROMYCIN 250 MG PO TABS: ORAL_TABLET | ORAL | 0 refills | Status: DC

## 2022-09-09 MED ORDER — LEVOCETIRIZINE DIHYDROCHLORIDE 5 MG PO TABS: ORAL_TABLET | ORAL | 5 refills | Status: DC

## 2022-09-09 MED ORDER — FLUTICASONE PROPIONATE HFA 110 MCG/ACT IN AERO: INHALATION_SPRAY | RESPIRATORY_TRACT | 5 refills | Status: DC

## 2022-09-09 MED ORDER — AMOXICILLIN-POT CLAVULANATE 875-125 MG PO TABS: ORAL_TABLET | ORAL | 0 refills | Status: DC

## 2022-09-09 MED ORDER — ALBUTEROL SULFATE HFA 108 (90 BASE) MCG/ACT IN AERS: 1.0000 | INHALATION_SPRAY | Freq: Four times a day (QID) | RESPIRATORY_TRACT | 1 refills | Status: DC | PRN

## 2022-09-09 NOTE — Addendum Note (Signed)
Addended by: Larence Penning on: 09/09/2022 10:37 AM   Modules accepted: Orders

## 2022-09-09 NOTE — Telephone Encounter (Signed)
Can you please call in Augmentin 875 mg BID times 7 days and azithromycin 500 mg for 1 day and 250 mg for the following 4 days. Thank you

## 2022-09-09 NOTE — Addendum Note (Signed)
Addended by: Eloy End D on: 09/09/2022 04:53 PM   Modules accepted: Orders

## 2022-11-01 ENCOUNTER — Ambulatory Visit: Payer: PPO | Admitting: Cardiology

## 2022-11-29 ENCOUNTER — Ambulatory Visit (INDEPENDENT_AMBULATORY_CARE_PROVIDER_SITE_OTHER): Payer: PPO | Admitting: Urology

## 2022-11-29 ENCOUNTER — Encounter: Payer: Self-pay | Admitting: Urology

## 2022-11-29 VITALS — BP 163/65 | HR 76

## 2022-11-29 DIAGNOSIS — N138 Other obstructive and reflux uropathy: Secondary | ICD-10-CM | POA: Diagnosis not present

## 2022-11-29 DIAGNOSIS — R3912 Poor urinary stream: Secondary | ICD-10-CM

## 2022-11-29 DIAGNOSIS — N5201 Erectile dysfunction due to arterial insufficiency: Secondary | ICD-10-CM | POA: Diagnosis not present

## 2022-11-29 DIAGNOSIS — N401 Enlarged prostate with lower urinary tract symptoms: Secondary | ICD-10-CM | POA: Diagnosis not present

## 2022-11-29 LAB — URINALYSIS, ROUTINE W REFLEX MICROSCOPIC
Bilirubin, UA: NEGATIVE
Glucose, UA: NEGATIVE
Ketones, UA: NEGATIVE
Nitrite, UA: NEGATIVE
Protein,UA: NEGATIVE
Specific Gravity, UA: 1.01 (ref 1.005–1.030)
Urobilinogen, Ur: 0.2 mg/dL (ref 0.2–1.0)
pH, UA: 6 (ref 5.0–7.5)

## 2022-11-29 LAB — MICROSCOPIC EXAMINATION: Bacteria, UA: NONE SEEN

## 2022-11-29 MED ORDER — TAMSULOSIN HCL 0.4 MG PO CAPS: ORAL_CAPSULE | ORAL | 3 refills | Status: DC

## 2022-11-29 NOTE — Progress Notes (Unsigned)
11/29/2022 8:55 AM   [name] [name] 27-Dec-1945 [id number]  Referring provider: Deon Pilling, NP [address] [place],  [place] [address]  Followup BPH   HPI: [name] [name] is a 77yo here for followup for BPH and weak urinary stream. IPSS 7 QOl 1 on flomax 0.'4mg'$  daily. He occasionally takes flomax BID. Urine stream strong. No straining to urinate. Nocturia 2-3x. No issues with erections currently   PMH: Past Medical History:  Diagnosis Date   Asthma    uses inhaler daily and as needed   Blood transfusion 2012   surgery- esophageal bleeding secondary to hiatal hernia   CKD (chronic kidney disease)    Constipation    Diverticulitis    Diverticulosis    Eczema    Esophageal stricture    GERD (gastroesophageal reflux disease)    HBP (high blood pressure)    on meds   HTN (hypertension)    Hyperlipemia    on meds   Kidney stones    hx of   Seasonal allergies    Trouble swallowing    Ulcer     Surgical History: Past Surgical History:  Procedure Laterality Date   COLONOSCOPY  2012   DB-F/V-mira(good)-tics   HIATAL HERNIA REPAIR  1992&2012   LAPAROSCOPIC NISSEN FUNDOPLICATION  0109, 32/35/57   WISDOM TOOTH EXTRACTION      Home Medications:  Allergies as of 11/29/2022       Reactions   Percocet [oxycodone-acetaminophen] Nausea And Vomiting   Sulfacetamide Sodium Hives        Medication List        Accurate as of November 29, 2022  8:55 AM. If you have any questions, ask your nurse or doctor.          acetaminophen 500 MG tablet Commonly known as: TYLENOL Take 1,000 mg by mouth at bedtime as needed (pain).   albuterol 108 (90 Base) MCG/ACT inhaler Commonly known as: Ventolin HFA Inhale 1-2 puffs into the lungs every 6 (six) hours as needed for wheezing or shortness of breath.   amoxicillin-clavulanate 875-125 MG tablet Commonly known as: AUGMENTIN 2 times a day for  7 days   Azelastine HCl 137 MCG/SPRAY Soln USE 1 OR 2 SPRAYS INTO EACH  NOSTRIL TWICE DAILY AS NEEDED FOR RUNNY NOSE/DRAINAGE.   azithromycin 250 MG tablet Commonly known as: ZITHROMAX 500 mg for 1 day and 250 mg for the following 4 days   diphenhydrAMINE 25 MG tablet Commonly known as: BENADRYL Take 25 mg by mouth at bedtime as needed for allergies.   famotidine 20 MG tablet Commonly known as: Pepcid Take 1 tablet (20 mg total) by mouth 2 (two) times daily. As needed.   fluticasone 110 MCG/ACT inhaler Commonly known as: Flovent HFA 2 puffs twice a day with spacer to help prevent cough and wheeze.   fluticasone 50 MCG/ACT nasal spray Commonly known as: FLONASE Use 2 sprays each nostril once a day as needed for stuffy nose   guaiFENesin-codeine 100-10 MG/5ML syrup Commonly known as: ROBITUSSIN AC codeine 10 mg-guaifenesin 100 mg/5 mL oral liquid  TAKE 5 MLS (ONE TEASPOON) AS NEEDED ORALLY EVERY 6 HRS   hydroxychloroquine 200 MG tablet Commonly known as: PLAQUENIL   levocetirizine 5 MG tablet Commonly known as: XYZAL TAKE 1 TABLET BY MOUTH ONCE DAILY AS NEEDED FOR RUNNY NOSE   levocetirizine 5 MG tablet Commonly known as: XYZAL TAKE 1 TABLET BY MOUTH ONCE DAILY AS NEEDED FOR RUNNY NOSE   losartan 50  MG tablet Commonly known as: COZAAR Take 50 mg by mouth daily.   montelukast 10 MG tablet Commonly known as: SINGULAIR TAKE 1 TABLET BY MOUTH EVERYDAY AT BEDTIME What changed:  how much to take how to take this when to take this reasons to take this   pantoprazole 40 MG tablet Commonly known as: PROTONIX TAKE 1 TABLET BY MOUTH EVERY DAY What changed:  when to take this reasons to take this   tamsulosin 0.4 MG Caps capsule Commonly known as: FLOMAX TAKE ONE CAPSULE BY MOUTH IN THE MORNING AND AT BEDTIME   triamcinolone cream 0.1 % Commonly known as: KENALOG SMARTSIG:1 Application Topical 2-3 Times Daily   VITAMIN D PO Take 1 tablet by mouth daily.        Allergies:  Allergies  Allergen Reactions   Percocet  [Oxycodone-Acetaminophen] Nausea And Vomiting   Sulfacetamide Sodium Hives    Family History: Family History  Problem Relation Age of Onset   Heart disease Father 40       heart attack   Colon cancer Neg Hx    Allergic rhinitis Neg Hx    Angioedema Neg Hx    Asthma Neg Hx    Eczema Neg Hx    Immunodeficiency Neg Hx    Urticaria Neg Hx    Colon polyps Neg Hx    Esophageal cancer Neg Hx    Rectal cancer Neg Hx    Stomach cancer Neg Hx     Social History:  reports that he quit smoking about 41 years ago. His smoking use included cigarettes. He has never used smokeless tobacco. He reports that he does not currently use alcohol. He reports that he does not use drugs.  ROS: All other review of systems were reviewed and are negative except what is noted above in HPI  Physical Exam: BP (!) 163/65   Pulse 76   Constitutional:  Alert and oriented, No acute distress. HEENT: [place] AT, moist mucus membranes.  Trachea midline, no masses. Cardiovascular: No clubbing, cyanosis, or edema. Respiratory: Normal respiratory effort, no increased work of breathing. GI: Abdomen is soft, nontender, nondistended, no abdominal masses GU: No CVA tenderness.  Lymph: No cervical or inguinal lymphadenopathy. Skin: No rashes, bruises or suspicious lesions. Neurologic: Grossly intact, no focal deficits, moving all 4 extremities. Psychiatric: Normal mood and affect.  Laboratory Data: Lab Results  Component Value Date   WBC 7.6 10/15/2015   HGB 14.0 10/15/2015   HCT 41.5 10/15/2015   MCV 91.8 10/15/2015   PLT 183 10/15/2015    Lab Results  Component Value Date   CREATININE 1.14 10/15/2015    No results found for: "PSA"  No results found for: "TESTOSTERONE"  No results found for: "HGBA1C"  Urinalysis    Component Value Date/Time   COLORURINE YELLOW 10/15/2015 2004   APPEARANCEUR Clear 11/30/2021 1008   LABSPEC 1.015 10/15/2015 2004   PHURINE 6.5 10/15/2015 2004   GLUCOSEU Negative  11/30/2021 1008   HGBUR NEGATIVE 10/15/2015 2004   BILIRUBINUR Negative 11/30/2021 [place] 10/15/2015 2004   PROTEINUR Negative 11/30/2021 [place] 10/15/2015 2004   UROBILINOGEN 0.2 08/27/2011 1750   NITRITE Negative 11/30/2021 1008   NITRITE NEGATIVE 10/15/2015 2004   LEUKOCYTESUR Trace (A) 11/30/2021 1008    Lab Results  Component Value Date   LABMICR See below: 11/30/2021   WBCUA 0-5 11/30/2021   LABEPIT 0-10 11/30/2021   BACTERIA None seen 11/30/2021    Pertinent Imaging:  No results found for this or any previous visit.  No results found for this or any previous visit.  No results found for this or any previous visit.  No results found for this or any previous visit.  No results found for this or any previous visit.  No valid procedures specified. No results found for this or any previous visit.  No results found for this or any previous visit.   Assessment & Plan:    1. Benign prostatic hyperplasia with urinary obstruction -flomax 0.,'4mg'$  daily - Urinalysis, Routine w reflex microscopic - BLADDER SCAN AMB NON-IMAGING  2. Weak urinary stream -Continue flomax 0.'4mg'$  daily  3. Erectile dysfunction due to arterial insufficiency -patient defers therapy at this time   No follow-ups on file.  Nicolette Bang, MD  [facility] [place]

## 2022-11-29 NOTE — Patient Instructions (Signed)

## 2022-11-29 NOTE — Progress Notes (Unsigned)
post void residual=132

## 2022-12-05 ENCOUNTER — Ambulatory Visit: Payer: PPO | Attending: Cardiology | Admitting: Cardiology

## 2022-12-05 ENCOUNTER — Encounter: Payer: Self-pay | Admitting: Cardiology

## 2022-12-05 VITALS — BP 164/90 | HR 64 | Ht 71.0 in | Wt 185.6 lb

## 2022-12-05 DIAGNOSIS — E7800 Pure hypercholesterolemia, unspecified: Secondary | ICD-10-CM | POA: Diagnosis not present

## 2022-12-05 DIAGNOSIS — Z01812 Encounter for preprocedural laboratory examination: Secondary | ICD-10-CM

## 2022-12-05 DIAGNOSIS — R072 Precordial pain: Secondary | ICD-10-CM

## 2022-12-05 DIAGNOSIS — I25119 Atherosclerotic heart disease of native coronary artery with unspecified angina pectoris: Secondary | ICD-10-CM

## 2022-12-05 LAB — BASIC METABOLIC PANEL
BUN/Creatinine Ratio: 10 (ref 10–24)
BUN: 10 mg/dL (ref 8–27)
CO2: 23 mmol/L (ref 20–29)
Calcium: 9.1 mg/dL (ref 8.6–10.2)
Chloride: 106 mmol/L (ref 96–106)
Creatinine, Ser: 1.01 mg/dL (ref 0.76–1.27)
Glucose: 94 mg/dL (ref 70–99)
Potassium: 4.6 mmol/L (ref 3.5–5.2)
Sodium: 141 mmol/L (ref 134–144)
eGFR: 78 mL/min/{1.73_m2} (ref >59)

## 2022-12-05 MED ORDER — METOPROLOL TARTRATE 50 MG PO TABS: 50.0000 mg | ORAL_TABLET | ORAL | 0 refills | Status: DC

## 2022-12-05 NOTE — Patient Instructions (Addendum)
Medication Instructions:  The current medical regimen is effective;  continue present plan and medications.  *If you need a refill on your cardiac medications before your next appointment, please call your pharmacy*   Lab Work: Please have blood work today (BMP)  If you have labs (blood work) drawn today and your tests are completely normal, you will receive your results only by: MyChart Message (if you have MyChart) OR A paper copy in the mail If you have any lab test that is abnormal or we need to change your treatment, we will call you to review the results.   Testing/Procedures:   Your cardiac CT will be scheduled at:   [facility] [address] [place], [place] [address] [phone]  Please arrive at the [facility] and Children's Entrance (Entrance C2) of [facility] 30 minutes prior to test start time. You can use the FREE valet parking offered at entrance C (encouraged to control the heart rate for the test)  Proceed to the [facility] Radiology Department (first floor) to check-in and test prep.  All radiology patients and guests should use entrance C2 at [facility], accessed from [facility], even though the hospital's physical address listed is [address].     Please follow these instructions carefully (unless otherwise directed):  Hold all erectile dysfunction medications at least 3 days (72 hrs) prior to test. (Ie viagra, cialis, sildenafil, tadalafil, etc) We will administer nitroglycerin during this exam.   On the Night Before the Test: Be sure to Drink plenty of water. Do not consume any caffeinated/decaffeinated beverages or chocolate 12 hours prior to your test. Do not take any antihistamines 12 hours prior to your test.  On the Day of the Test: Drink plenty of water until 1 hour prior to the test. Do not eat any food 1 hour prior to test. You may take your regular medications prior to the test.  Take  metoprolol (Lopressor) two hours prior to test. HOLD Furosemide/Hydrochlorothiazide morning of the test.  After the Test: Drink plenty of water. After receiving IV contrast, you may experience a mild flushed feeling. This is normal. On occasion, you may experience a mild rash up to 24 hours after the test. This is not dangerous. If this occurs, you can take Benadryl 25 mg and increase your fluid intake. If you experience trouble breathing, this can be serious. If it is severe call 911 IMMEDIATELY. If it is mild, please call our office. If you take any of these medications: Glipizide/Metformin, Avandament, Glucavance, please do not take 48 hours after completing test unless otherwise instructed.  We will call to schedule your test 2-4 weeks out understanding that some insurance companies will need an authorization prior to the service being performed.   For non-scheduling related questions, please contact the cardiac imaging nurse navigator should you have any questions/concerns: Marchia Bond, Cardiac Imaging Nurse Navigator Gordy Clement, Cardiac Imaging Nurse Navigator [facility] Direct Office Dial: [phone]   For scheduling needs, including cancellations and rescheduling, please call [place], [phone].   Follow-Up: At [facility], you and your health needs are our priority.  As part of our continuing mission to provide you with exceptional heart care, we have created designated Provider Care Teams.  These Care Teams include your primary Cardiologist (physician) and Advanced Practice Providers (APPs -  Physician Assistants and Nurse Practitioners) who all work together to provide you with the care you need, when  you need it.  We recommend signing up for the patient portal called "MyChart".  Sign up information is provided on this After Visit Summary.  MyChart is used to connect with patients for Virtual Visits (Telemedicine).  Patients are able  to view lab/test results, encounter notes, upcoming appointments, etc.  Non-urgent messages can be sent to your provider as well.   To learn more about what you can do with MyChart, go to [email or web address].    Your next appointment:   1 year(s)  Provider:   Dr Candee Furbish

## 2022-12-05 NOTE — Progress Notes (Signed)
Cardiology Office Note:    Date:  12/05/2022   ID:  [name] [name], DOB 05/05/1946, MRN [id number]  PCP:  Deon Pilling, NP   [facility] Cardiologist:  Candee Furbish, MD     Referring MD: Deon Pilling, NP    History of Present Illness:    [name] [name] is a 77 y.o. male here for the evaluation of elevated coronary calcium score, coronary atherosclerosis at the request of of Deon Pilling, NP of [employer].  His father had myocardial infarction in his 4s that resulted in death.  He has hypertension, hypertension as well as a heart murmur.  On losartan 50 mg.  He has declined statin in the past.  He is taking red yeast rice.  Sees an allergist for asthma.  Has had shortness of breath past associated with asthma. Has occasional CP, thought to be gas, dull pain few min. Chiropractor helps.  Takes ASA from time to time.   Retired 2016 then went back to work 5 days later for [employer]. 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  Enjoys [place] athletics.  We discussed Zerita Boers retirement.   Hemoglobin A1c 5.9 total cholesterol 158 triglycerides 35 HDL 51 LDL 100, creatinine 1.1 sodium 141 potassium 4.9 ALT 0.6 hemoglobin 13.4  Echocardiogram report from 08/15/2022 performed in [facility] demonstrates left ventricle with mild to moderate wall thickness  Wall motion with EF of 55% trace mitral regurgitation trace to mild tricuspid regurgitation no pulmonary hypertension.  Coronary calcium score was 270, 56 percentile.  A repeat noncontrast CT was recommended in 3 months secondary to lingular findings.  This was on 04/28/2022.    Past Medical History:  Diagnosis Date   Asthma    uses inhaler daily and as needed   Blood transfusion 2012   surgery- esophageal bleeding secondary to hiatal hernia   CKD (chronic kidney disease)    Constipation    Diverticulitis     Diverticulosis    Eczema    Esophageal stricture    GERD (gastroesophageal reflux disease)    HBP (high blood pressure)    on meds   HTN (hypertension)    Hyperlipemia    on meds   Kidney stones    hx of   Seasonal allergies    Trouble swallowing    Ulcer     Past Surgical History:  Procedure Laterality Date   COLONOSCOPY  2012   DB-F/V-mira(good)-tics   HIATAL HERNIA REPAIR  1992&2012   LAPAROSCOPIC NISSEN FUNDOPLICATION  9767, 34/19/37   WISDOM TOOTH EXTRACTION      Current Medications: Current Meds  Medication Sig   acetaminophen (TYLENOL) 500 MG tablet Take 1,000 mg by mouth at bedtime as needed (pain).   albuterol (VENTOLIN HFA) 108 (90 Base) MCG/ACT inhaler Inhale 1-2 puffs into the lungs every 6 (six) hours as needed for wheezing or shortness of breath.   Azelastine HCl 137 MCG/SPRAY SOLN USE 1 OR 2 SPRAYS INTO EACH NOSTRIL TWICE DAILY AS NEEDED FOR RUNNY NOSE/DRAINAGE.   Cholecalciferol (VITAMIN D PO) Take 1 tablet by mouth daily.   diphenhydrAMINE (BENADRYL) 25 MG tablet Take 25 mg by mouth at bedtime as needed for allergies.   famotidine (PEPCID) 20 MG tablet Take 1 tablet (20 mg total) by mouth 2 (two) times daily. As needed.   fluticasone (FLONASE) 50 MCG/ACT nasal spray Use 2 sprays each nostril once a  day as needed for stuffy nose   fluticasone (FLOVENT HFA) 110 MCG/ACT inhaler 2 puffs twice a day with spacer to help prevent cough and wheeze.   levocetirizine (XYZAL) 5 MG tablet TAKE 1 TABLET BY MOUTH ONCE DAILY AS NEEDED FOR RUNNY NOSE   losartan (COZAAR) 50 MG tablet Take 50 mg by mouth daily.   metoprolol tartrate (LOPRESSOR) 50 MG tablet Take 1 tablet (50 mg total) by mouth as directed. Take one tablet (2) hours before your CT scan   montelukast (SINGULAIR) 10 MG tablet TAKE 1 TABLET BY MOUTH EVERYDAY AT BEDTIME   pantoprazole (PROTONIX) 40 MG tablet TAKE 1 TABLET BY MOUTH EVERY DAY   tamsulosin (FLOMAX) 0.4 MG CAPS capsule TAKE ONE CAPSULE BY MOUTH IN THE  MORNING AND AT BEDTIME   triamcinolone cream (KENALOG) 0.1 % SMARTSIG:1 Application Topical 2-3 Times Daily     Allergies:   Percocet [oxycodone-acetaminophen] and Sulfacetamide sodium   Social History   Socioeconomic History   Marital status: Married    Spouse name: Not on file   Number of children: 2   Years of education: Not on file   Highest education level: Not on file  Occupational History   Occupation: Scientist, clinical (histocompatibility and immunogenetics): [employer]  Tobacco Use   Smoking status: Former    Types: Cigarettes    Quit date: 11/25/1981    Years since quitting: 41.0   Smokeless tobacco: Never  Vaping Use   Vaping Use: Never used  Substance and Sexual Activity   Alcohol use: Not Currently    Alcohol/week: 0.0 - 3.0 standard drinks of alcohol   Drug use: No   Sexual activity: Never  Other Topics Concern   Not on file  Social History Narrative   2 daily caffeine drinks   Social Determinants of Health   Financial Resource Strain: Not on file  Food Insecurity: Not on file  Transportation Needs: Not on file  Physical Activity: Not on file  Stress: Not on file  Social Connections: Not on file     Family History: The patient's family history includes Heart disease (age of onset: 27) in his father. There is no history of Colon cancer, Allergic rhinitis, Angioedema, Asthma, Eczema, Immunodeficiency, Urticaria, Colon polyps, Esophageal cancer, Rectal cancer, or Stomach cancer.  ROS:   Please see the history of present illness.     All other systems reviewed and are negative.  EKGs/Labs/Other Studies Reviewed:    The following studies were reviewed today: As above, office notes lab work CT scan  EKG:  The ekg ordered today demonstrates 12/05/2022 shows normal sinus rhythm 64 with no other abnormalities.  Recent Labs: No results found for requested labs within last 365 days.  Recent Lipid Panel No results found for: "CHOL", "TRIG", "HDL", "CHOLHDL", "VLDL", "LDLCALC",  "LDLDIRECT"   Risk Assessment/Calculations:              Physical Exam:    VS:  BP (!) 164/90   Pulse 64   Ht '5\' 11"'$  (1.803 m)   Wt 185 lb 9.6 oz (84.2 kg)   SpO2 99%   BMI 25.89 kg/m     Wt Readings from Last 3 Encounters:  12/05/22 185 lb 9.6 oz (84.2 kg)  09/02/22 186 lb (84.4 kg)  08/12/22 183 lb 6.4 oz (83.2 kg)     GEN:  Well nourished, well developed in no acute distress HEENT: Normal NECK: No JVD; No carotid bruits LYMPHATICS: No lymphadenopathy CARDIAC: RRR, no  murmurs, rubs, gallops RESPIRATORY:  Clear to auscultation without rales, wheezing or rhonchi  ABDOMEN: Soft, non-tender, non-distended MUSCULOSKELETAL:  No edema; No deformity  SKIN: Warm and dry NEUROLOGIC:  Alert and oriented x 3 PSYCHIATRIC:  Normal affect   ASSESSMENT:    1. Precordial chest pain   2. Pre-procedure lab exam   3. Pure hypercholesterolemia   4. Atherosclerosis of native coronary artery of native heart with angina pectoris ([place])    PLAN:    In order of problems listed above:  Precordial chest pain - Dull left-sided chest discomfort periodically.  Father had massive heart attack died in his early 17s.  Also has occasional shortness of breath.  We will go ahead and check a coronary CT scan for further analysis.  We want to see the lumen of his arteries and understand if there is any significant stenosis present.  Coronary atherosclerosis - Seen on previous coronary calcium score.  After coronary CT scan we will formulate a game plan for him.  In the past he has had some statin intolerance.  He remembers trying several different types and having body aches as well as potential memory impairment.  I discussed with him that often we will try a low-dose with decreased frequency throughout the week at first.  I have also discussed with him that we have a lipid clinic here that will be able to come up with alternative therapies if necessary. - The important thing is to do provide  secondary prevention for him and help reduce his risk of stroke myocardial infarction etc.  I explained to him that statins and alternatives can minimize inflammation and result in plaque stabilization thus resulting in decreased MIs.  Heart murmur - Heard in the right upper sternal border.  No significant aortic valvular dysfunction was noted on his echocardiogram as above.  He had mild mitral/tricuspid regurgitation noted.  Blood pressure elevation - Continue to monitor home.  Per primary team as well.  We will follow-up with results of study and come up with secondary prevention strategy.          Medication Adjustments/Labs and Tests Ordered: Current medicines are reviewed at length with the patient today.  Concerns regarding medicines are outlined above.  Orders Placed This Encounter  Procedures   CT CORONARY MORPH W/CTA COR W/SCORE W/CA W/CM &/OR WO/CM   Basic metabolic panel   EKG 37-CHYI   Meds ordered this encounter  Medications   metoprolol tartrate (LOPRESSOR) 50 MG tablet    Sig: Take 1 tablet (50 mg total) by mouth as directed. Take one tablet (2) hours before your CT scan    Dispense:  1 tablet    Refill:  0    Patient Instructions  Medication Instructions:  The current medical regimen is effective;  continue present plan and medications.  *If you need a refill on your cardiac medications before your next appointment, please call your pharmacy*   Lab Work: Please have blood work today (BMP)  If you have labs (blood work) drawn today and your tests are completely normal, you will receive your results only by: MyChart Message (if you have MyChart) OR A paper copy in the mail If you have any lab test that is abnormal or we need to change your treatment, we will call you to review the results.   Testing/Procedures:   Your cardiac CT will be scheduled at:   [facility] [address] [place], [place] [address] [phone]  Please arrive  at the [facility] Entrance (Entrance C2) of [facility] 30 minutes prior to test start time. You can use the FREE valet parking offered at entrance C (encouraged to control the heart rate for the test)  Proceed to the [facility] Radiology Department (first floor) to check-in and test prep.  All radiology patients and guests should use entrance C2 at [facility], accessed from [facility], even though the hospital's physical address listed is [address].     Please follow these instructions carefully (unless otherwise directed):  Hold all erectile dysfunction medications at least 3 days (72 hrs) prior to test. (Ie viagra, cialis, sildenafil, tadalafil, etc) We will administer nitroglycerin during this exam.   On the Night Before the Test: Be sure to Drink plenty of water. Do not consume any caffeinated/decaffeinated beverages or chocolate 12 hours prior to your test. Do not take any antihistamines 12 hours prior to your test.  On the Day of the Test: Drink plenty of water until 1 hour prior to the test. Do not eat any food 1 hour prior to test. You may take your regular medications prior to the test.  Take metoprolol (Lopressor) two hours prior to test. HOLD Furosemide/Hydrochlorothiazide morning of the test.  After the Test: Drink plenty of water. After receiving IV contrast, you may experience a mild flushed feeling. This is normal. On occasion, you may experience a mild rash up to 24 hours after the test. This is not dangerous. If this occurs, you can take Benadryl 25 mg and increase your fluid intake. If you experience trouble breathing, this can be serious. If it is severe call 911 IMMEDIATELY. If it is mild, please call our office. If you take any of these medications: Glipizide/Metformin, Avandament, Glucavance, please do not take 48 hours after completing test unless otherwise instructed.  We will call to schedule your test  2-4 weeks out understanding that some insurance companies will need an authorization prior to the service being performed.   For non-scheduling related questions, please contact the cardiac imaging nurse navigator should you have any questions/concerns: Marchia Bond, Cardiac Imaging Nurse Navigator Gordy Clement, Cardiac Imaging Nurse Navigator [facility] Direct Office Dial: [phone]   For scheduling needs, including cancellations and rescheduling, please call [place], [phone].   Follow-Up: At [facility], you and your health needs are our priority.  As part of our continuing mission to provide you with exceptional heart care, we have created designated Provider Care Teams.  These Care Teams include your primary Cardiologist (physician) and Advanced Practice Providers (APPs -  Physician Assistants and Nurse Practitioners) who all work together to provide you with the care you need, when you need it.  We recommend signing up for the patient portal called "MyChart".  Sign up information is provided on this After Visit Summary.  MyChart is used to connect with patients for Virtual Visits (Telemedicine).  Patients are able to view lab/test results, encounter notes, upcoming appointments, etc.  Non-urgent messages can be sent to your provider as well.   To learn more about what you can do with MyChart, go to [email or web address].    Your next appointment:   1 year(s)  Provider:   Dr Candee Furbish        Signed, Candee Furbish, MD  12/05/2022 12:46 PM    [place]

## 2022-12-11 ENCOUNTER — Telehealth (HOSPITAL_COMMUNITY): Payer: Self-pay | Admitting: *Deleted

## 2022-12-11 NOTE — Telephone Encounter (Signed)
Patient returning call regarding upcoming cardiac imaging study; pt verbalizes understanding of appt date/time, parking situation and where to check in, pre-test NPO status and medications ordered, and verified current allergies; name and call back number provided for further questions should they arise  [name] Clement RN Navigator Cardiac Imaging [facility] [phone] office [phone] cell  Patient to take '50mg'$  metoprolol tartrate two hours prior to his cardiac CT scan.  He is aware to arrive at 11:30am.

## 2022-12-11 NOTE — Telephone Encounter (Signed)
Attempted to call patient regarding upcoming cardiac CT appointment. °Left message on voicemail with name and callback number ° °Ladislao Cohenour RN Navigator Cardiac Imaging °[facility] °[phone] Office °[phone] Cell ° °

## 2022-12-12 ENCOUNTER — Ambulatory Visit (HOSPITAL_COMMUNITY)
Admission: RE | Admit: 2022-12-12 | Discharge: 2022-12-12 | Disposition: A | Discharge status: Home / Self Care | Payer: PPO | Source: Ambulatory Visit | Attending: Cardiology | Admitting: Cardiology

## 2022-12-12 ENCOUNTER — Other Ambulatory Visit: Payer: Self-pay | Admitting: Internal Medicine

## 2022-12-12 DIAGNOSIS — R931 Abnormal findings on diagnostic imaging of heart and coronary circulation: Secondary | ICD-10-CM | POA: Insufficient documentation

## 2022-12-12 DIAGNOSIS — R072 Precordial pain: Secondary | ICD-10-CM | POA: Diagnosis present

## 2022-12-12 MED ORDER — IOHEXOL 350 MG/ML SOLN
100.0000 mL | Freq: Once | INTRAVENOUS | Status: AC | PRN
  Administered 2022-12-12: 100 mL via INTRAVENOUS

## 2022-12-12 MED ORDER — NITROGLYCERIN 0.4 MG SL SUBL
0.8000 mg | SUBLINGUAL_TABLET | Freq: Once | SUBLINGUAL | Status: AC
  Administered 2022-12-12: 0.8 mg via SUBLINGUAL

## 2022-12-12 MED ORDER — NITROGLYCERIN 0.4 MG SL SUBL
SUBLINGUAL_TABLET | SUBLINGUAL | Status: AC
  Filled 2022-12-12: qty 2

## 2022-12-13 ENCOUNTER — Ambulatory Visit (HOSPITAL_BASED_OUTPATIENT_CLINIC_OR_DEPARTMENT_OTHER)
Admission: RE | Admit: 2022-12-13 | Discharge: 2022-12-13 | Disposition: A | Discharge status: Home / Self Care | Payer: PPO | Source: Ambulatory Visit | Attending: Internal Medicine | Admitting: Internal Medicine

## 2022-12-13 DIAGNOSIS — R072 Precordial pain: Secondary | ICD-10-CM | POA: Diagnosis not present

## 2022-12-13 DIAGNOSIS — R931 Abnormal findings on diagnostic imaging of heart and coronary circulation: Secondary | ICD-10-CM | POA: Diagnosis not present

## 2022-12-16 ENCOUNTER — Encounter: Payer: Self-pay | Admitting: Cardiology

## 2022-12-16 ENCOUNTER — Telehealth: Payer: Self-pay | Admitting: Cardiology

## 2022-12-16 DIAGNOSIS — E7800 Pure hypercholesterolemia, unspecified: Secondary | ICD-10-CM

## 2022-12-16 MED ORDER — ISOSORBIDE MONONITRATE ER 30 MG PO TB24: 30.0000 mg | ORAL_TABLET | Freq: Every day | ORAL | 3 refills | Status: DC

## 2022-12-16 MED ORDER — ASPIRIN 81 MG PO TBEC: 81.0000 mg | DELAYED_RELEASE_TABLET | Freq: Every day | ORAL | 3 refills | Status: DC

## 2022-12-16 MED ORDER — METOPROLOL SUCCINATE ER 25 MG PO TB24: 50.0000 mg | ORAL_TABLET | Freq: Every day | ORAL | 3 refills | Status: DC

## 2022-12-16 MED ORDER — ROSUVASTATIN CALCIUM 10 MG PO TABS: 10.0000 mg | ORAL_TABLET | Freq: Every day | ORAL | 3 refills | Status: DC

## 2022-12-16 NOTE — Telephone Encounter (Signed)
Follow Up:     Patient is returning Pam's call from Friday, concerning his test results.

## 2022-12-16 NOTE — Telephone Encounter (Signed)
Multivessel coronary artery disease.  Flow analysis was performed and distal left circumflex was hemodynamically significant.  Other vessels including right coronary artery, left anterior descending artery did not appear to be flow limiting.   In these situations, we will first trial medical management.  I would like to start isosorbide 30 mg a day, Toprol-XL 25 mg a day.  Start aspirin 81 mg a day.  I would also like for him to start Crestor 10 mg a day.  He has declined statin therapy in the past but given the amount of coronary disease present this would be highly beneficial.   Recheck lipid panel in 2 months after starting.   If he continues to have chest discomfort despite medical management as above, we will proceed with cardiac catheterization.   Lets have him follow-up in 2 months.  [name] Furbish, [name] [name]   Pt is aware of the above results and orders.  Lipid ordered and schedule in 2 months.   F/u appt with [name] [name] scheduled as well.  Pt had no questions or concerns at the time of the call but was urged to c/b should any arise.

## 2023-01-09 ENCOUNTER — Ambulatory Visit: Payer: PPO | Admitting: Podiatry

## 2023-01-09 DIAGNOSIS — D2372 Other benign neoplasm of skin of left lower limb, including hip: Secondary | ICD-10-CM | POA: Diagnosis not present

## 2023-01-09 DIAGNOSIS — M216X2 Other acquired deformities of left foot: Secondary | ICD-10-CM

## 2023-01-09 NOTE — Progress Notes (Signed)
Subjective:   Patient ID: [name] [name], male   DOB: 77 y.o.   MRN: [id number]   HPI Chief Complaint  Patient presents with   Callouses    Left foot, ball of foot, center, and near 5th digit    77 year old male presents the office today with the above concerns.  Over the last 6 months he is not having clinical lesions to the ball of his foot.  May have started after certain pair shoes that he was wearing.  No openings or drainage.  No injuries.  No other concerns.  No treatment.   Review of Systems  All other systems reviewed and are negative.  Past Medical History:  Diagnosis Date   Asthma    uses inhaler daily and as needed   Blood transfusion 2012   surgery- esophageal bleeding secondary to hiatal hernia   CKD (chronic kidney disease)    Constipation    Diverticulitis    Diverticulosis    Eczema    Esophageal stricture    GERD (gastroesophageal reflux disease)    HBP (high blood pressure)    on meds   HTN (hypertension)    Hyperlipemia    on meds   Kidney stones    hx of   Seasonal allergies    Trouble swallowing    Ulcer     Past Surgical History:  Procedure Laterality Date   COLONOSCOPY  2012   DB-F/V-mira(good)-tics   HIATAL HERNIA REPAIR  1992&2012   LAPAROSCOPIC NISSEN FUNDOPLICATION  [id number], [id number]   WISDOM TOOTH EXTRACTION       Current Outpatient Medications:    acetaminophen (TYLENOL) 500 MG tablet, Take 1,000 mg by mouth at bedtime as needed (pain)., Disp: , Rfl:    albuterol (VENTOLIN HFA) 108 (90 Base) MCG/ACT inhaler, Inhale 1-2 puffs into the lungs every 6 (six) hours as needed for wheezing or shortness of breath., Disp: 18 g, Rfl: 1   aspirin EC 81 MG tablet, Take 1 tablet (81 mg total) by mouth daily. Swallow whole., Disp: 90 tablet, Rfl: 3   Azelastine HCl 137 MCG/SPRAY SOLN, USE 1 OR 2 SPRAYS INTO EACH NOSTRIL TWICE DAILY AS NEEDED FOR RUNNY NOSE/DRAINAGE., Disp: 30 mL, Rfl: 5   Cholecalciferol (VITAMIN D PO), Take 1 tablet by mouth daily.,  Disp: , Rfl:    diphenhydrAMINE (BENADRYL) 25 MG tablet, Take 25 mg by mouth at bedtime as needed for allergies., Disp: , Rfl:    famotidine (PEPCID) 20 MG tablet, Take 1 tablet (20 mg total) by mouth 2 (two) times daily. As needed., Disp: 60 tablet, Rfl: 3   fluticasone (FLONASE) 50 MCG/ACT nasal spray, Use 2 sprays each nostril once a day as needed for stuffy nose, Disp: 48 mL, Rfl: 3   fluticasone (FLOVENT HFA) 110 MCG/ACT inhaler, 2 puffs twice a day with spacer to help prevent cough and wheeze., Disp: 1 each, Rfl: 5   guaiFENesin-codeine (ROBITUSSIN AC) 100-10 MG/5ML syrup, , Disp: , Rfl:    hydroxychloroquine (PLAQUENIL) 200 MG tablet, , Disp: , Rfl:    isosorbide mononitrate (IMDUR) 30 MG 24 hr tablet, Take 1 tablet (30 mg total) by mouth daily., Disp: 90 tablet, Rfl: 3   levocetirizine (XYZAL) 5 MG tablet, TAKE 1 TABLET BY MOUTH ONCE DAILY AS NEEDED FOR RUNNY NOSE, Disp: 90 tablet, Rfl: 1   losartan (COZAAR) 50 MG tablet, Take 50 mg by mouth daily., Disp: , Rfl:    metoprolol succinate (TOPROL-XL) 25 MG 24 hr tablet, Take 2  tablets (50 mg total) by mouth daily. Take with or immediately following a meal., Disp: 90 tablet, Rfl: 3   montelukast (SINGULAIR) 10 MG tablet, TAKE 1 TABLET BY MOUTH EVERYDAY AT BEDTIME, Disp: 30 tablet, Rfl: 5   pantoprazole (PROTONIX) 40 MG tablet, TAKE 1 TABLET BY MOUTH EVERY DAY, Disp: 30 tablet, Rfl: 6   rosuvastatin (CRESTOR) 10 MG tablet, Take 1 tablet (10 mg total) by mouth daily., Disp: 90 tablet, Rfl: 3   tamsulosin (FLOMAX) 0.4 MG CAPS capsule, TAKE ONE CAPSULE BY MOUTH IN THE MORNING AND AT BEDTIME, Disp: 180 capsule, Rfl: 3   triamcinolone cream (KENALOG) 0.1 %, SMARTSIG:1 Application Topical 2-3 Times Daily, Disp: , Rfl:   Allergies  Allergen Reactions   Percocet [Oxycodone-Acetaminophen] Nausea And Vomiting   Sulfacetamide Sodium Hives           Objective:  Physical Exam  General: AAO x3, NAD  Dermatological: Hyperkeratotic lesions left  foot submetatarsal 3 and 5 without any underlying ulceration drainage or signs of infection.  There are no open lesions.  There are other smaller punctate lesions which are superficial without any drainage or pus.  There is no capillary budding or evidence of verruca.  Vascular: Dorsalis Pedis artery and Posterior Tibial artery pedal pulses are 2/4 bilateral with immedate capillary fill time. There is no pain with calf compression, swelling, warmth, erythema.   Neruologic: Grossly intact via light touch bilateral.   Musculoskeletal: Prominence of the metatarsal head plantarly with atrophy of the fat pad. Gait: Unassisted, Nonantalgic.       Assessment:   Hyperkeratotic lesions, prominent metatarsal heads     Plan:  -Treatment options discussed including all alternatives, risks, and complications -Etiology of symptoms were discussed -Sharply debrided lesions x 2 without any complications or bleeding.  He was cleaned alcohol and callus placed followed by salicylic acid and a bandage.  Postprocedure instructions discussed.  Monitor for any signs or symptoms of infection.  -Metatarsal offloading pads. Trula Slade DPM

## 2023-01-09 NOTE — Patient Instructions (Signed)
Keep the bandage on for 24 hours. At that time, remove and clean with soap and water. If it hurts or burns before 24 hours go ahead and remove the bandage and wash with soap and water. Keep the area clean. If there is any blistering cover with antibiotic ointment and a bandage. Monitor for any redness, drainage, or other signs of infection. Call the office if any are to occur. If you have any questions, please call the office at [phone].  

## 2023-01-13 ENCOUNTER — Encounter: Payer: Self-pay | Admitting: Family Medicine

## 2023-01-13 ENCOUNTER — Ambulatory Visit (INDEPENDENT_AMBULATORY_CARE_PROVIDER_SITE_OTHER): Payer: PPO | Admitting: Family Medicine

## 2023-01-13 ENCOUNTER — Other Ambulatory Visit: Payer: Self-pay

## 2023-01-13 VITALS — BP 140/70 | Temp 98.4°F | Resp 14 | Ht 71.0 in | Wt 187.1 lb

## 2023-01-13 DIAGNOSIS — K219 Gastro-esophageal reflux disease without esophagitis: Secondary | ICD-10-CM

## 2023-01-13 DIAGNOSIS — J4540 Moderate persistent asthma, uncomplicated: Secondary | ICD-10-CM

## 2023-01-13 DIAGNOSIS — J3089 Other allergic rhinitis: Secondary | ICD-10-CM | POA: Diagnosis not present

## 2023-01-13 MED ORDER — MONTELUKAST SODIUM 10 MG PO TABS: ORAL_TABLET | ORAL | 5 refills | Status: DC

## 2023-01-13 MED ORDER — FLUTICASONE PROPIONATE HFA 110 MCG/ACT IN AERO: INHALATION_SPRAY | RESPIRATORY_TRACT | 5 refills | Status: DC

## 2023-01-13 MED ORDER — ALBUTEROL SULFATE HFA 108 (90 BASE) MCG/ACT IN AERS: 1.0000 | INHALATION_SPRAY | Freq: Four times a day (QID) | RESPIRATORY_TRACT | 1 refills | Status: DC | PRN

## 2023-01-13 MED ORDER — MONTELUKAST SODIUM 10 MG PO TABS: 10.0000 mg | ORAL_TABLET | Freq: Every day | ORAL | 5 refills | Status: DC

## 2023-01-13 NOTE — Patient Instructions (Addendum)
Mild persistent asthma Continue Flovent 110 mcg 2 puffs twice a day with spacer to help prevent cough and wheeze. Try to remember to take this inhaler twice a day every day Continue montelukast 10 mg once a day to help prevent cough and wheeze Continue albuterol 2 puffs every 4 hours as needed for cough, wheeze, tightness in chest,or shortness of breath. Asthma control goals:  Full participation in all desired activities (may need albuterol before activity) Albuterol use two time or less a week on average (not counting use with activity) Cough interfering with sleep two time or less a month Oral steroids no more than once a year No hospitalizations  Perennial allergic rhinitis Continue montelukast as above. Continue fluticasone nasal spray 2 sprays each nostril twice a day as needed for stuffy nose Begin saline nasal rinses as needed for nasal symptoms. Use this before any medicated nasal sprays for best result Continue azelastine nasal spray 2 sprays in each nostril twie a day as needed for a runny nose Consider updating your allergy skin testing. Remember to stop antihistamines for 3 days before the testing If your symptoms are not well controlled with the treatment plan as listed above, consider allergen immunotherapy  Gastroesophageal reflux Continue dietary and lifestyle modifications  Please let us know if this treatment plan is not working well for you.  Follow up in 6 months or sooner if needed.   Lifestyle Changes for Controlling GERD When you have GERD, stomach acid feels as if it's backing up toward your mouth. Whether or not you take medication to control your GERD, your symptoms can often be improved with lifestyle changes.   Raise Your Head Reflux is more likely to strike when you're lying down flat, because stomach fluid can flow backward more easily. Raising the head of your bed 4-6 inches can help. To do this: Slide blocks or books under the legs at the head of  your bed. Or, place a wedge under the mattress. Many foam stores can make a suitable wedge for you. The wedge should run from your waist to the top of your head. Don't just prop your head on several pillows. This increases pressure on your stomach. It can make GERD worse.  Watch Your Eating Habits Certain foods may increase the acid in your stomach or relax the lower esophageal sphincter, making GERD more likely. It's best to avoid the following: Coffee, tea, and carbonated drinks (with and without caffeine) Fatty, fried, or spicy food Mint, chocolate, onions, and tomatoes Any other foods that seem to irritate your stomach or cause you pain  Relieve the Pressure Eat smaller meals, even if you have to eat more often. Don't lie down right after you eat. Wait a few hours for your stomach to empty. Avoid tight belts and tight-fitting clothes. Lose excess weight.  Tobacco and Alcohol Avoid smoking tobacco and drinking alcohol. They can make GERD symptoms worse.

## 2023-01-13 NOTE — Progress Notes (Signed)
[name] [name] [id number] Dept: [phone]  FOLLOW UP NOTE  Patient ID: [name] [name], male    DOB: 06-20-1946  Age: 77 y.o. MRN: [id number] Date of Office Visit: 01/13/2023  Assessment  Chief Complaint: Mild persistent asthma without complication and Follow-up  HPI [name] [name] is a 77 year old male who presents to the clinic for follow-up visit.  He was last seen in this clinic on 09/02/2022 by Gareth Morgan, FNP, for evaluation of asthma, allergic rhinitis, reflux, and cough.  In the interim, he had a CT scan indicating multi vessel coronary disease on 12/11/2022. He also reports that he was exposed to influenza while caring for his grandson who did test positive for influenza. [name] [name] took Tamidul at that time for influenza post exposure prophylaxis.  At today's visit, he reports his asthma has been moderately well contorlled with symptoms including occasional shortness of breath with activity, infrequent wheeze, and occasional cough producing mucus occurring mainly in the morning. He continues montelukast 10 mg once a day and uses Flovent 110-2 puffs about half of the days of the week. He rarely uses albuterol for relief of symptoms.  Allergic rhinitis is reported as moderately well controlled with symptoms including clear rhinorrhea in the morning, occasional nasal congestion, infrequent sneezing, and copious post nasal drainage with frequent throat clearing. He continues Flonase and azelastine as needed and is not currently using a nasal saline rinse.   Reflux is reported as well controlled with no symptoms including heartburn or vomiting. He reports that he is not currently talking any medications to control reflux.   His current medications are listed in the chart.  Drug Allergies:  Allergies  Allergen Reactions   Percocet [Oxycodone-Acetaminophen] Nausea And Vomiting   Sulfacetamide Sodium Hives    Physical Exam: BP (!) 140/70 (BP Location: Left Arm, Cuff Size: Normal)    Temp 98.4 F (36.9 C) (Temporal)   Resp 14   Ht 5' 11"$  (1.803 m)   Wt 187 lb 1.6 oz (84.9 kg)   SpO2 96%   BMI 26.10 kg/m    Physical Exam Vitals reviewed.  Constitutional:      Appearance: Normal appearance.  HENT:     Head: Normocephalic and atraumatic.     Right Ear: Tympanic membrane normal.     Left Ear: Tympanic membrane normal.     Nose:     Comments: Bilateral nares slightly erythematous with thin clear nasal drainage noted. Pharynx normal. Ears normal. Eyes normal    Mouth/Throat:     Pharynx: Oropharynx is clear.  Eyes:     Conjunctiva/sclera: Conjunctivae normal.  Cardiovascular:     Rate and Rhythm: Normal rate and regular rhythm.     Heart sounds: Normal heart sounds. No murmur heard. Pulmonary:     Effort: Pulmonary effort is normal.     Breath sounds: Normal breath sounds.     Comments: Lungs clear to auscultation Musculoskeletal:        General: Normal range of motion.     Cervical back: Normal range of motion and neck supple.  Skin:    General: Skin is warm and dry.  Neurological:     Mental Status: He is alert and oriented to person, place, and time.  Psychiatric:        Mood and Affect: Mood normal.        Behavior: Behavior normal.        Thought Content: Thought content normal.  Judgment: Judgment normal.     Diagnostics: FVC 3.08, FEV1 2.31. Predicted FVC 4.42, predicted FEV1 3.22. Spirometry indicates mild restriction. This is consistent with previous spirometry readings  Assessment and Plan: 1. Moderate persistent asthma without complication   2. Perennial allergic rhinitis   3. Gastroesophageal reflux disease, unspecified whether esophagitis present     Meds ordered this encounter  Medications   albuterol (VENTOLIN HFA) 108 (90 Base) MCG/ACT inhaler    Sig: Inhale 1-2 puffs into the lungs every 6 (six) hours as needed for wheezing or shortness of breath.    Dispense:  18 g    Refill:  1   DISCONTD: montelukast (SINGULAIR) 10  MG tablet    Sig: TAKE 1 TABLET BY MOUTH EVERYDAY AT BEDTIME    Dispense:  30 tablet    Refill:  5   fluticasone (FLOVENT HFA) 110 MCG/ACT inhaler    Sig: 2 puffs twice a day with spacer to help prevent cough and wheeze.    Dispense:  1 each    Refill:  5   montelukast (SINGULAIR) 10 MG tablet    Sig: TAKE 1 TABLET BY MOUTH EVERYDAY AT BEDTIME    Dispense:  30 tablet    Refill:  5    Patient Instructions  Mild persistent asthma Continue Flovent 110 mcg 2 puffs twice a day with spacer to help prevent cough and wheeze. Try to remember to take this inhaler twice a day every day Continue montelukast 10 mg once a day to help prevent cough and wheeze Continue albuterol 2 puffs every 4 hours as needed for cough, wheeze, tightness in chest,or shortness of breath. Asthma control goals:  Full participation in all desired activities (may need albuterol before activity) Albuterol use two time or less a week on average (not counting use with activity) Cough interfering with sleep two time or less a month Oral steroids no more than once a year No hospitalizations  Perennial allergic rhinitis Continue montelukast as above. Continue fluticasone nasal spray 2 sprays each nostril twice a day as needed for stuffy nose Begin saline nasal rinses as needed for nasal symptoms. Use this before any medicated nasal sprays for best result Continue azelastine nasal spray 2 sprays in each nostril twie a day as needed for a runny nose Consider updating your allergy skin testing. Remember to stop antihistamines for 3 days before the testing If your symptoms are not well controlled with the treatment plan as listed above, consider allergen immunotherapy  Gastroesophageal reflux Continue dietary and lifestyle modifications  Please let us know if this treatment plan is not working well for you.  Follow up in 6 months or sooner if needed.   Return in about 6 months (around 07/14/2023), or if symptoms worsen or  fail to improve.    Thank you for the opportunity to care for this patient.  Please do not hesitate to contact me with questions.  Gareth Morgan, FNP [facility]

## 2023-01-13 NOTE — Addendum Note (Signed)
Addended by: Brandt Loosen, Noelani Harbach E on: 01/13/2023 01:58 PM   Modules accepted: Orders

## 2023-01-27 ENCOUNTER — Other Ambulatory Visit: Payer: Self-pay | Admitting: Family

## 2023-02-19 ENCOUNTER — Ambulatory Visit: Payer: PPO | Attending: Cardiology

## 2023-02-19 DIAGNOSIS — E7800 Pure hypercholesterolemia, unspecified: Secondary | ICD-10-CM

## 2023-02-21 LAB — LIPID PANEL
Chol/HDL Ratio: 2.3 ratio (ref 0.0–5.0)
Cholesterol, Total: 113 mg/dL (ref 100–199)
HDL: 50 mg/dL (ref >39)
LDL Chol Calc (NIH): 53 mg/dL (ref 0–99)
Triglycerides: 40 mg/dL (ref 0–149)
VLDL Cholesterol Cal: 10 mg/dL (ref 5–40)

## 2023-02-25 ENCOUNTER — Encounter: Payer: Self-pay | Admitting: Cardiology

## 2023-02-25 ENCOUNTER — Ambulatory Visit: Payer: PPO | Attending: Cardiology | Admitting: Cardiology

## 2023-02-25 VITALS — BP 158/70 | HR 58 | Ht 71.0 in | Wt 183.0 lb

## 2023-02-25 DIAGNOSIS — E7800 Pure hypercholesterolemia, unspecified: Secondary | ICD-10-CM | POA: Diagnosis not present

## 2023-02-25 DIAGNOSIS — I10 Essential (primary) hypertension: Secondary | ICD-10-CM

## 2023-02-25 DIAGNOSIS — I2510 Atherosclerotic heart disease of native coronary artery without angina pectoris: Secondary | ICD-10-CM | POA: Diagnosis not present

## 2023-02-25 MED ORDER — IRBESARTAN-HYDROCHLOROTHIAZIDE 150-12.5 MG PO TABS: 1.0000 | ORAL_TABLET | Freq: Every day | ORAL | 3 refills | Status: DC

## 2023-02-25 NOTE — Progress Notes (Signed)
Cardiology Office Note:    Date:  02/25/2023   ID:  [name] [name], DOB Mar 31, 1946, MRN [id number]  PCP:  Deon Pilling, NP   [facility] Prov[facility] Cardiologist:  Candee Furbish, MD     Referring MD: Deon Pilling, NP    History of Present Illness:    [name] [name] is a 77 y.o. male here for follow-up elevated coronary calcium score, hypertension, coronary atherosclerosis as seen on coronary CT scan as described below.   His father had myocardial infarction in his 57s that resulted in death.  He has hypertension, hypertension as well as a heart murmur.   On losartan 50 mg.  Blood pressure has still been challenging control.  He is also on low-dose Toprol 25 as well as isosorbide after the results of the CT scan.  He has declined statin in the past but he was willing to try Crestor 10 mg which has helped his LDL significantly down to 53.  He is doing well with this..  Was previously taking red yeast rice.  Sees an allergist for asthma.  Has had shortness of breath past associated with asthma. Has occasional CP, thought to be gas, dull pain few min. Chiropractor helps.  Takes ASA.    Retired 2016 then went back to work 5 days later for [employer]. 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  Enjoys [place] athletics.  We discussed Zerita Boers retirement.     Hemoglobin A1c 5.9    Echocardiogram report from 08/15/2022 performed in [facility] demonstrates left ventricle with mild to moderate wall thickness   Wall motion with EF of 55% trace mitral regurgitation trace to mild tricuspid regurgitation no pulmonary hypertension.   Coronary calcium score was 270, 56 percentile.  A repeat noncontrast CT was recommended in 3 months secondary to lingular findings.  This was on 04/28/2022.   Toprol initially caused him some fatigue but he is doing better with this.  He is exercising more.  Blood pressures at home are  still in the 148-137 range.  LDL 53 on Crestor 10.  Denies any chest pain fevers chills nausea vomiting.  Tolerating his exercise well.     Past Medical History:  Diagnosis Date   Asthma    uses inhaler daily and as needed   Blood transfusion 2012   surgery- esophageal bleeding secondary to hiatal hernia   CKD (chronic kidney disease)    Constipation    Diverticulitis    Diverticulosis    Eczema    Esophageal stricture    GERD (gastroesophageal reflux disease)    HBP (high blood pressure)    on meds   HTN (hypertension)    Hyperlipemia    on meds   Kidney stones    hx of   Seasonal allergies    Trouble swallowing    Ulcer     Past Surgical History:  Procedure Laterality Date   COLONOSCOPY  2012   DB-F/V-mira(good)-tics   HIATAL HERNIA REPAIR  1992&2012   LAPAROSCOPIC NISSEN FUNDOPLICATION  99991111, [id number]   WISDOM TOOTH EXTRACTION      Current Medications: Current Meds  Medication Sig   acetaminophen (TYLENOL) 500 MG tablet Take 1,000 mg by mouth at bedtime as needed (pain).   albuterol (VENTOLIN HFA) 108 (90 Base) MCG/ACT inhaler Inhale 1-2 puffs into the lungs every 6 (six) hours as needed for wheezing or shortness of breath.  aspirin EC 81 MG tablet Take 1 tablet (81 mg total) by mouth daily. Swallow whole.   Azelastine HCl 137 MCG/SPRAY SOLN USE 1 OR 2 SPRAYS INTO EACH NOSTRIL TWICE DAILY AS NEEDED FOR RUNNY NOSE/DRAINAGE.   Cholecalciferol (VITAMIN D PO) Take 1 tablet by mouth daily.   diphenhydrAMINE (BENADRYL) 25 MG tablet Take 25 mg by mouth at bedtime as needed for allergies.   famotidine (PEPCID) 20 MG tablet Take 1 tablet (20 mg total) by mouth 2 (two) times daily. As needed.   fluticasone (FLONASE) 50 MCG/ACT nasal spray USE 2 SPRAYS INTO EACH NOSTRIL ONCE A DAY AS NEEDED FOR STUFFY NOSE   fluticasone (FLOVENT HFA) 110 MCG/ACT inhaler 2 puffs twice a day with spacer to help prevent cough and wheeze.   guaiFENesin-codeine (ROBITUSSIN AC) 100-10 MG/5ML  syrup    hydroxychloroquine (PLAQUENIL) 200 MG tablet    irbesartan-hydrochlorothiazide (AVALIDE) 150-12.5 MG tablet Take 1 tablet by mouth daily.   isosorbide mononitrate (IMDUR) 30 MG 24 hr tablet Take 1 tablet (30 mg total) by mouth daily.   levocetirizine (XYZAL) 5 MG tablet TAKE 1 TABLET BY MOUTH ONCE DAILY AS NEEDED FOR RUNNY NOSE   metoprolol succinate (TOPROL-XL) 25 MG 24 hr tablet Take 2 tablets (50 mg total) by mouth daily. Take with or immediately following a meal.   montelukast (SINGULAIR) 10 MG tablet Take 1 tablet (10 mg total) by mouth at bedtime.   pantoprazole (PROTONIX) 40 MG tablet TAKE 1 TABLET BY MOUTH EVERY DAY   rosuvastatin (CRESTOR) 10 MG tablet Take 1 tablet (10 mg total) by mouth daily.   TAMIFLU 75 MG capsule Take 75 mg by mouth as needed.   tamsulosin (FLOMAX) 0.4 MG CAPS capsule TAKE ONE CAPSULE BY MOUTH IN THE MORNING AND AT BEDTIME   triamcinolone cream (KENALOG) 0.1 % SMARTSIG:1 Application Topical 2-3 Times Daily   [DISCONTINUED] losartan (COZAAR) 50 MG tablet Take 50 mg by mouth daily.     Allergies:   Percocet [oxycodone-acetaminophen] and Sulfacetamide sodium   Social History   Socioeconomic History   Marital status: Married    Spouse name: Not on file   Number of children: 2   Years of education: Not on file   Highest education level: Not on file  Occupational History   Occupation: Scientist, clinical (histocompatibility and immunogenetics): [employer]  Tobacco Use   Smoking status: Former    Types: Cigarettes    Quit date: 11/25/1981    Years since quitting: 41.2   Smokeless tobacco: Never  Vaping Use   Vaping Use: Never used  Substance and Sexual Activity   Alcohol use: Not Currently    Alcohol/week: 0.0 - 3.0 standard drinks of alcohol   Drug use: No   Sexual activity: Never  Other Topics Concern   Not on file  Social History Narrative   2 daily caffeine drinks   Social Determinants of Health   Financial Resource Strain: Not on file  Food Insecurity: Not on  file  Transportation Needs: Not on file  Physical Activity: Not on file  Stress: Not on file  Social Connections: Not on file     Family History: The patient's family history includes Heart disease (age of onset: 60) in his father. There is no history of Colon cancer, Allergic rhinitis, Angioedema, Asthma, Eczema, Immunodeficiency, Urticaria, Colon polyps, Esophageal cancer, Rectal cancer, or Stomach cancer.  ROS:   Please see the history of present illness.     All other systems reviewed and  are negative.  EKGs/Labs/Other Studies Reviewed:    The following studies were reviewed today: Cardiac Studies & Procedures          CT SCANS  CT CORONARY MORPH W/CTA COR W/SCORE 12/12/2022  Addendum 12/12/2022  3:59 PM ADDENDUM REPORT: 12/12/2022 15:57  EXAM: OVER-READ INTERPRETATION CARDIAC CT CHEST  The following report is an over-read performed by radiologist Dr. Van Clines of [facility], [place] on 12/12/2022. This over-read does not include interpretation of cardiac or coronary anatomy or pathology. The cardiac interpretation by the cardiologist is attached or will be attached.  COMPARISON:  CT chest 09/02/2022  FINDINGS: Extracardiac Vascular: Unremarkable  Mediastinum: Small type 1 hiatal hernia.  Lung: Airway thickening is present, suggesting bronchitis or reactive airways disease.  Included Upper Abdomen: Small hypodense lesion in the dome of segment 7 of the liver, image 52 series 11, also present on 02/03/2017, technically nonspecific although statistically highly likely to be benign and incidental based on long-term stability.  Musculoskeletal: Mild thoracic spondylosis.  IMPRESSION: 1. Small type 1 hiatal hernia. 2. Airway thickening is present, suggesting bronchitis or reactive airways disease. 3. Small hypodense lesion in the dome of segment 7 of the liver, also present on 02/03/2017, technically nonspecific although statistically highly likely  to be benign and incidental based on long-term stability.   Electronically Signed By: Van Clines M.D. On: 12/12/2022 15:57  Narrative HISTORY: Chest pain/anginal equiv, intermediate CAD risk, not treadmill candidate  EXAM: Cardiac/Coronary  CT  TECHNIQUE: The patient was scanned on a [employer].  PROTOCOL: A 120 kV prospective scan was triggered in the descending thoracic aorta at 111 HU's. Axial non-contrast 3 mm slices were carried out through the heart. The data set was analyzed on a dedicated work station and scored using the Agatston method. Gantry rotation speed was 250 msecs and collimation was .6 mm. Beta blockade and 0.8 mg of sl NTG was given. The 3D data set was reconstructed in 5% intervals of the 35-75 % of the R-R cycle. Systolic and diastolic phases were analyzed on a dedicated work station using MPR, MIP and VRT modes. The patient received contrast: 15mL OMNIPAQUE IOHEXOL 350 MG/ML SOLN.  FINDINGS: Image quality: Good  Noise artifact is: Limited  Coronary calcium score is 219, which places the patient in the 49th percentile for age and sex matched control.  Coronary arteries: Normal coronary origins.  Right dominance.  Right Coronary Artery: Severe atherosclerotic plaque in the proximal-mid RCA, 70-99% stenosis. Mild mixed atherosclerotic plaque in the mid-distal and distal RCA, 25-49% stenosis.  Left Main Coronary Artery: Minimal atherosclerotic plaque in the mid-distal LM, <25% stenosis.  Left Anterior Descending Coronary Artery: Moderate proximal and mid mixed atherosclerotic plaque, 50-69% stenosis. Mild plaque in the proximal first diagonal, 25-49% stenosis.  Left Circumflex Artery: Mild mixed atherosclerotic plaque in the proximal LCx, 25-49% stenosis. Severe atherosclerotic plaque in the distal LCx, 70-99% stenosis.  Aorta: Normal size, 30 mm at the mid ascending aorta (level of the PA bifurcation) measured double  oblique. Moderate aortic atherosclerosis in the descending thoracic aorta.  Aortic Valve: Mild calcifications. AV calcium score 73  Other findings:  Normal pulmonary vein drainage into the left atrium.  Normal left atrial appendage without thrombus, chicken wing morphology.  Normal size of the pulmonary artery.  Please see separate report from [facility] for non-cardiac findings.  IMPRESSION: 1. Severe CAD in the proximal-mid RCA and distal LCx, 70-99% stenosis, CADRADS 4. CT FFR will be performed and reported separately.  2. Coronary calcium score is 219, which places the patient in the 49th percentile for age and sex matched control.  3. Normal coronary origins with right dominance.  RECOMMENDATIONS: CAD-RADS 4 Severe stenosis. (70-99% or > 50% left main). Cardiac catheterization or CT FFR is recommended. Consider symptom-guided anti-ischemic pharmacotherapy as well as risk factor modification per guideline directed care.  Electronically Signed: By: Cherlynn Kaiser M.D. On: 12/12/2022 15:12         1. CT FFR analysis showed high likelihood of hemodynamic significance in the distal LCX lesion, FFR 0.69. Vessel measures 2.5 mm proximal to lesion.   2. CT FFR analysis showed borderline likelihood of hemodynamic significance in the proximal-mid RCA lesion, FFR 0.80.   RECOMMENDATIONS If symptoms are refractory to medical therapy, consider cardiac catheterization for distal LCx lesion.  Multivessel coronary artery disease.  Flow analysis was performed and distal left circumflex was hemodynamically significant.  Other vessels including right coronary artery, left anterior descending artery did not appear to be flow limiting.   In these situations, we will first trial medical management.  I would like to start isosorbide 30 mg a day, Toprol-XL 25 mg a day.  Start aspirin 81 mg a day.  I would also like for him to start Crestor 10 mg a day.  He has declined  statin therapy in the past but given the amount of coronary disease present this would be highly beneficial.   Recheck lipid panel in 2 months after starting.   If he continues to have chest discomfort despite medical management as above, we will proceed with cardiac catheterization.     EKG: No new today  Recent Labs: 12/05/2022: BUN 10; Creatinine, Ser 1.01; Potassium 4.6; Sodium 141  Recent Lipid Panel    Component Value Date/Time   CHOL 113 02/19/2023 0754   TRIG 40 02/19/2023 0754   HDL 50 02/19/2023 0754   CHOLHDL 2.3 02/19/2023 0754   LDLCALC 53 02/19/2023 0754     Risk Assessment/Calculations:              Physical Exam:    VS:  BP (!) 158/70 (BP Location: Left Arm, Patient Position: Sitting, Cuff Size: Normal)   Pulse (!) 58   Ht 5\' 11"  (1.803 m)   Wt 183 lb (83 kg)   BMI 25.52 kg/m     Wt Readings from Last 3 Encounters:  02/25/23 183 lb (83 kg)  01/13/23 187 lb 1.6 oz (84.9 kg)  12/05/22 185 lb 9.6 oz (84.2 kg)     GEN:  Well nourished, well developed in no acute distress HEENT: Normal NECK: No JVD; No carotid bruits LYMPHATICS: No lymphadenopathy CARDIAC: RRR, no murmurs, rubs, gallops RESPIRATORY:  Clear to auscultation without rales, wheezing or rhonchi  ABDOMEN: Soft, non-tender, non-distended MUSCULOSKELETAL:  No edema; No deformity  SKIN: Warm and dry NEUROLOGIC:  Alert and oriented x 3 PSYCHIATRIC:  Normal affect   ASSESSMENT:    1. Coronary artery disease involving native coronary artery of native heart without angina pectoris   2. Pure hypercholesterolemia   3. Primary hypertension    PLAN:    In order of problems listed above:  Coronary artery disease - Secondary prevention strategy.  He is not having any exertional angina.  He does have an FFR positive circumflex lesion for which we are treating him with Toprol-XL as well as isosorbide.  Plaque stabilization with Crestor as well.  He is tolerating the medications well.  Diet and  exercise.  Aspirin 81 mg.  Medications reviewed at length.  Hypertension - Still having some trouble with controlling blood pressure.  We will switch his losartan 50 mg over to irbesartan hydrochlorothiazide 50/12.5.  Hopefully with the addition of mild diuretic this will improve his blood pressure.  Mediterranean diet.  Mild tricuspid and mitral regurgitation noted on echocardiogram as above.  Continue to monitor clinically.  Strong family history of MI - Continue with aggressive risk factor modification.  He will let us know if any symptoms develop.  Continue to modify risk factors.          Medication Adjustments/Labs and Tests Ordered: Current medicines are reviewed at length with the patient today.  Concerns regarding medicines are outlined above.  No orders of the defined types were placed in this encounter.  Meds ordered this encounter  Medications   irbesartan-hydrochlorothiazide (AVALIDE) 150-12.5 MG tablet    Sig: Take 1 tablet by mouth daily.    Dispense:  90 tablet    Refill:  3    Patient Instructions  Medication Instructions:  Please discontinue your Losartan and start Irbesartan/HCTZ 150-12.5 mg one tablet by mouth daily. Continue all other medications as listed.  *If you need a refill on your cardiac medications before your next appointment, please call your pharmacy*  Follow-Up: At [facility], you and your health needs are our priority.  As part of our continuing mission to provide you with exceptional heart care, we have created designated Provider Care Teams.  These Care Teams include your primary Cardiologist (physician) and Advanced Practice Providers (APPs -  Physician Assistants and Nurse Practitioners) who all work together to provide you with the care you need, when you need it.  We recommend signing up for the patient portal called "MyChart".  Sign up information is provided on this After Visit Summary.  MyChart is used to connect with  patients for Virtual Visits (Telemedicine).  Patients are able to view lab/test results, encounter notes, upcoming appointments, etc.  Non-urgent messages can be sent to your provider as well.   To learn more about what you can do with MyChart, go to [email or web address].    Your next appointment:   6 month(s)  Provider:   Candee Furbish, MD        Signed, Candee Furbish, MD  02/25/2023 9:01 AM    [place]

## 2023-02-25 NOTE — Patient Instructions (Signed)
Medication Instructions:  Please discontinue your Losartan and start Irbesartan/HCTZ 150-12.5 mg one tablet by mouth daily. Continue all other medications as listed.  *If you need a refill on your cardiac medications before your next appointment, please call your pharmacy*  Follow-Up: At [facility], you and your health needs are our priority.  As part of our continuing mission to provide you with exceptional heart care, we have created designated Provider Care Teams.  These Care Teams include your primary Cardiologist (physician) and Advanced Practice Providers (APPs -  Physician Assistants and Nurse Practitioners) who all work together to provide you with the care you need, when you need it.  We recommend signing up for the patient portal called "MyChart".  Sign up information is provided on this After Visit Summary.  MyChart is used to connect with patients for Virtual Visits (Telemedicine).  Patients are able to view lab/test results, encounter notes, upcoming appointments, etc.  Non-urgent messages can be sent to your provider as well.   To learn more about what you can do with MyChart, go to [email or web address].    Your next appointment:   6 month(s)  Provider:   Candee Furbish, MD

## 2023-02-28 NOTE — Progress Notes (Signed)
Pt has been made aware of normal result and verbalized understanding.  jw

## 2023-03-04 ENCOUNTER — Other Ambulatory Visit: Payer: Self-pay | Admitting: Family Medicine

## 2023-04-10 ENCOUNTER — Telehealth: Payer: Self-pay | Admitting: Cardiology

## 2023-04-10 NOTE — Telephone Encounter (Signed)
Did you add a medication? No  If no, reason? Reason for not adding med: Other Just started statin (had previously declined in the past)  Did you remove a medication? No  Did you increase the dosage of any medication? No  Did you decrease the dosage of any medication? No  Did you refer to a specialist (i.e. lipid clinic, preventive cardiology, endocrinology)? No  Has patient seen plaque report? No

## 2023-04-14 ENCOUNTER — Encounter: Payer: Self-pay | Admitting: Cardiology

## 2023-04-18 ENCOUNTER — Other Ambulatory Visit: Payer: Self-pay

## 2023-04-18 MED ORDER — HYDROCHLOROTHIAZIDE 12.5 MG PO CAPS: 12.5000 mg | ORAL_CAPSULE | Freq: Every day | ORAL | 3 refills | Status: DC

## 2023-04-18 NOTE — Progress Notes (Signed)
Pt sent in MyChart about feeling dizzy and unwell since starting Irbesartan-Hydrochlorothiazide. He had went to his PCP on 5/20 and she instructed him to take half a tablet. Went to [facility] (Dr. Excell Seltzer) and he instructed the change to hydrochlorothiazide 12.5 mg daily to see if he feels better.

## 2023-05-28 ENCOUNTER — Encounter: Payer: Self-pay | Admitting: Cardiology

## 2023-05-28 DIAGNOSIS — I10 Essential (primary) hypertension: Secondary | ICD-10-CM

## 2023-05-29 NOTE — Telephone Encounter (Signed)
Please see the MyChart message reply(ies) for my assessment and plan.    Let's stop the hydrochlorothiazide. This should help. Continue to hydrate.  Keep me posted.    This patient gave consent for this Medical Advice Message and is aware that it may result in a bill to [employer], as well as the possibility of receiving a bill for a co-payment or deductible. They are an established patient, but are not seeking medical advice exclusively about a problem treated during an in person or video visit in the last seven days. I did not recommend an in person or video visit within seven days of my reply.    I spent a total of 6 minutes cumulative time within 7 days through [employer].  Donato Schultz, MD

## 2023-05-30 ENCOUNTER — Telehealth: Payer: Self-pay | Admitting: *Deleted

## 2023-05-30 NOTE — Telephone Encounter (Signed)
Spoke with pt and reviewed Dr Anne Fu' order to d/c hydrochlorothiazide.  Pt reports he did read that on MyChart and has stopped taking it.  He will continue to monitor his VS at home and let us know if he has any further questions or concerns.

## 2023-06-01 ENCOUNTER — Other Ambulatory Visit: Payer: Self-pay | Admitting: Cardiology

## 2023-06-01 DIAGNOSIS — I10 Essential (primary) hypertension: Secondary | ICD-10-CM

## 2023-07-13 NOTE — Progress Notes (Unsigned)
Follow Up Note  RE: [name] [name] MRN: [id number] DOB: [birth year] Date of Office Visit: 07/14/2023  Referring provider: Linus Galas, NP Primary care provider: Linus Galas, NP  Chief Complaint: No chief complaint on file.  History of Present Illness: I had the pleasure of seeing [name] [name] for a follow up visit at the [facility] on 07/13/2023. He is a 77 y.o. male, who is being followed for asthma, allergic rhinitis and GERD. His previous allergy office visit was on 01/13/2023 with Thermon Leyland, FNP. Today is a regular follow up visit.  Mild persistent asthma Continue Flovent 110 mcg 2 puffs twice a day with spacer to help prevent cough and wheeze. Try to remember to take this inhaler twice a day every day Continue montelukast 10 mg once a day to help prevent cough and wheeze Continue albuterol 2 puffs every 4 hours as needed for cough, wheeze, tightness in chest,or shortness of breath. Asthma control goals:  Full participation in all desired activities (may need albuterol before activity) Albuterol use two time or less a week on average (not counting use with activity) Cough interfering with sleep two time or less a month Oral steroids no more than once a year No hospitalizations   Perennial allergic rhinitis 2018 skin testing positive to mold, cockroach and dust mites. Continue montelukast as above. Continue fluticasone nasal spray 2 sprays each nostril twice a day as needed for stuffy nose Begin saline nasal rinses as needed for nasal symptoms. Use this before any medicated nasal sprays for best result Continue azelastine nasal spray 2 sprays in each nostril twie a day as needed for a runny nose Consider updating your allergy skin testing. Remember to stop antihistamines for 3 days before the testing If your symptoms are not well controlled with the treatment plan as listed above, consider allergen immunotherapy   Gastroesophageal reflux Continue dietary and  lifestyle modifications   Please let us know if this treatment plan is not working well for you.  Assessment and Plan: [name] [name] is a 77 y.o. male with: ***  No follow-ups on file.  No orders of the defined types were placed in this encounter.  Lab Orders  No laboratory test(s) ordered today    Diagnostics: Spirometry:  Tracings reviewed. His effort: {Blank single:19197::"Good reproducible efforts.","It was hard to get consistent efforts and there is a question as to whether this reflects a maximal maneuver.","Poor effort, data can not be interpreted."} FVC: ***L FEV1: ***L, ***% predicted FEV1/FVC ratio: ***% Interpretation: {Blank single:19197::"Spirometry consistent with mild obstructive disease","Spirometry consistent with moderate obstructive disease","Spirometry consistent with severe obstructive disease","Spirometry consistent with possible restrictive disease","Spirometry consistent with mixed obstructive and restrictive disease","Spirometry uninterpretable due to technique","Spirometry consistent with normal pattern","No overt abnormalities noted given today's efforts"}.  Please see scanned spirometry results for details.  Skin Testing: {Blank single:19197::"Select foods","Environmental allergy panel","Environmental allergy panel and select foods","Food allergy panel","None","Deferred due to recent antihistamines use"}. *** Results discussed with patient/family.   Medication List:  Current Outpatient Medications  Medication Sig Dispense Refill   acetaminophen (TYLENOL) 500 MG tablet Take 1,000 mg by mouth at bedtime as needed (pain).     albuterol (VENTOLIN HFA) 108 (90 Base) MCG/ACT inhaler Inhale 1-2 puffs into the lungs every 6 (six) hours as needed for wheezing or shortness of breath. 18 g 1   aspirin EC 81 MG tablet Take 1 tablet (81 mg total) by mouth daily. Swallow whole. 90 tablet 3   Azelastine HCl 137 MCG/SPRAY SOLN  USE 1 OR 2 SPRAYS INTO EACH NOSTRIL TWICE DAILY AS  NEEDED FOR RUNNY NOSE/DRAINAGE. 30 mL 5   Cholecalciferol (VITAMIN D PO) Take 1 tablet by mouth daily.     diphenhydrAMINE (BENADRYL) 25 MG tablet Take 25 mg by mouth at bedtime as needed for allergies.     famotidine (PEPCID) 20 MG tablet Take 1 tablet (20 mg total) by mouth 2 (two) times daily. As needed. 60 tablet 3   fluticasone (FLONASE) 50 MCG/ACT nasal spray USE 2 SPRAYS INTO EACH NOSTRIL ONCE A DAY AS NEEDED FOR STUFFY NOSE 48 mL 3   fluticasone (FLOVENT HFA) 110 MCG/ACT inhaler 2 puffs twice a day with spacer to help prevent cough and wheeze. 1 each 5   guaiFENesin-codeine (ROBITUSSIN AC) 100-10 MG/5ML syrup      hydroxychloroquine (PLAQUENIL) 200 MG tablet      isosorbide mononitrate (IMDUR) 30 MG 24 hr tablet Take 1 tablet (30 mg total) by mouth daily. 90 tablet 3   levocetirizine (XYZAL) 5 MG tablet TAKE 1 TABLET BY MOUTH ONCE DAILY AS NEEDED FOR RUNNY NOSE 90 tablet 1   metoprolol succinate (TOPROL-XL) 25 MG 24 hr tablet TAKE 2 TABLETS (50 MG TOTAL) BY MOUTH DAILY WITH OR IMMEDIATELY FOLLOWING A MEAL 180 tablet 1   montelukast (SINGULAIR) 10 MG tablet Take 1 tablet (10 mg total) by mouth at bedtime. 30 tablet 5   pantoprazole (PROTONIX) 40 MG tablet TAKE 1 TABLET BY MOUTH EVERY DAY 30 tablet 6   rosuvastatin (CRESTOR) 10 MG tablet Take 1 tablet (10 mg total) by mouth daily. 90 tablet 3   TAMIFLU 75 MG capsule Take 75 mg by mouth as needed.     tamsulosin (FLOMAX) 0.4 MG CAPS capsule TAKE ONE CAPSULE BY MOUTH IN THE MORNING AND AT BEDTIME 180 capsule 3   triamcinolone cream (KENALOG) 0.1 % SMARTSIG:1 Application Topical 2-3 Times Daily     No current facility-administered medications for this visit.   Allergies: Allergies  Allergen Reactions   Percocet [Oxycodone-Acetaminophen] Nausea And Vomiting   Sulfacetamide Sodium Hives   I reviewed his past medical history, social history, family history, and environmental history and no significant changes have been reported from his  previous visit.  Review of Systems  Constitutional:  Negative for appetite change, chills, fever and unexpected weight change.  HENT:  Negative for congestion and rhinorrhea.   Eyes:  Negative for itching.  Respiratory:  Negative for cough, chest tightness, shortness of breath and wheezing.   Cardiovascular:  Negative for chest pain.  Gastrointestinal:  Negative for abdominal pain.  Genitourinary:  Negative for difficulty urinating.  Skin:  Negative for rash.  Allergic/Immunologic: Positive for environmental allergies.  Neurological:  Negative for headaches.    Objective: There were no vitals taken for this visit. There is no height or weight on file to calculate BMI. Physical Exam Vitals and nursing note reviewed.  Constitutional:      Appearance: Normal appearance. He is well-developed.  HENT:     Head: Normocephalic and atraumatic.     Right Ear: Tympanic membrane and external ear normal.     Left Ear: Tympanic membrane and external ear normal.     Nose: Nose normal.     Mouth/Throat:     Mouth: Mucous membranes are moist.     Pharynx: Oropharynx is clear.  Eyes:     Conjunctiva/sclera: Conjunctivae normal.  Cardiovascular:     Rate and Rhythm: Normal rate and regular rhythm.  Heart sounds: Normal heart sounds. No murmur heard.    No friction rub. No gallop.  Pulmonary:     Effort: Pulmonary effort is normal.     Breath sounds: Normal breath sounds. No wheezing, rhonchi or rales.  Musculoskeletal:     Cervical back: Neck supple.  Skin:    General: Skin is warm.     Findings: No rash.  Neurological:     Mental Status: He is alert and oriented to person, place, and time.  Psychiatric:        Behavior: Behavior normal.    Previous notes and tests were reviewed. The plan was reviewed with the patient/family, and all questions/concerned were addressed.  It was my pleasure to see [name] [name] today and participate in his care. Please feel free to contact me with any  questions or concerns.  Sincerely,  Wyline Mood, DO Allergy & Immunology  [facility] office: [phone] [facility] office: [phone]

## 2023-07-14 ENCOUNTER — Encounter: Payer: Self-pay | Admitting: Allergy

## 2023-07-14 ENCOUNTER — Ambulatory Visit: Payer: PPO | Admitting: Allergy

## 2023-07-14 ENCOUNTER — Other Ambulatory Visit: Payer: Self-pay

## 2023-07-14 VITALS — BP 118/74 | HR 61 | Temp 98.3°F | Ht 71.0 in | Wt 180.5 lb

## 2023-07-14 DIAGNOSIS — J4530 Mild persistent asthma, uncomplicated: Secondary | ICD-10-CM

## 2023-07-14 DIAGNOSIS — J3089 Other allergic rhinitis: Secondary | ICD-10-CM | POA: Diagnosis not present

## 2023-07-14 DIAGNOSIS — Z91038 Other insect allergy status: Secondary | ICD-10-CM | POA: Diagnosis not present

## 2023-07-14 DIAGNOSIS — K219 Gastro-esophageal reflux disease without esophagitis: Secondary | ICD-10-CM

## 2023-07-14 MED ORDER — FLUTICASONE PROPIONATE HFA 110 MCG/ACT IN AERO: 2.0000 | INHALATION_SPRAY | Freq: Two times a day (BID) | RESPIRATORY_TRACT | 5 refills | Status: DC

## 2023-07-14 NOTE — Patient Instructions (Addendum)
No change in your medications.  Mild persistent asthma Breathing today unremarkable today. Spacer given and demonstrated proper use with inhaler. Patient understood technique and all questions/concerned were addressed.  Daily controller medication(s): continue Flovent (fluticasone) 2 puffs twice a day with spacer and rinse mouth afterwards. Continue Singulair (montelukast) 10mg  daily at night.  May use albuterol rescue inhaler 2 puffs every 4 to 6 hours as needed for shortness of breath, chest tightness, coughing, and wheezing.  Monitor frequency of use - if you need to use it more than twice per week on a consistent basis let us know.  Breathing control goals:  Full participation in all desired activities (may need albuterol before activity) Albuterol use two times or less a week on average (not counting use with activity) Cough interfering with sleep two times or less a month Oral steroids no more than once a year No hospitalizations   Perennial allergic rhinitis 2018 skin testing positive to mold, cockroach and dust mites. Continue environmental control measures as below. Continue Singulair (montelukast) 10mg  daily at night. Use over the counter antihistamines such as Zyrtec (cetirizine), Claritin (loratadine), Allegra (fexofenadine), or Xyzal (levocetirizine) daily as needed. May switch antihistamines every few months. Use Flonase (fluticasone) nasal spray 1-2 sprays per nostril once a day as needed for nasal congestion.  Use azelastine nasal spray 1-2 sprays per nostril twice a day as needed for runny nose/drainage. Nasal saline spray (i.e., Simply Saline) or nasal saline lavage (i.e., NeilMed) is recommended as needed and prior to medicated nasal sprays.  Return in about 6 months (around 01/14/2024). Or sooner if needed.  Control of House Dust Mite Allergen Dust mite allergens are a common trigger of allergy and asthma symptoms. While they can be found throughout the house,  these microscopic creatures thrive in warm, humid environments such as bedding, upholstered furniture and carpeting. Because so much time is spent in the bedroom, it is essential to reduce mite levels there.  Encase pillows, mattresses, and box springs in special allergen-proof fabric covers or airtight, zippered plastic covers.  Bedding should be washed weekly in hot water (130 F) and dried in a hot dryer. Allergen-proof covers are available for comforters and pillows that can't be regularly washed.  Wash the allergy-proof covers every few months. Minimize clutter in the bedroom. Keep pets out of the bedroom.  Keep humidity less than 50% by using a dehumidifier or air conditioning. You can buy a humidity measuring device called a hygrometer to monitor this.  If possible, replace carpets with hardwood, linoleum, or washable area rugs. If that's not possible, vacuum frequently with a vacuum that has a HEPA filter. Remove all upholstered furniture and non-washable window drapes from the bedroom. Remove all non-washable stuffed toys from the bedroom.  Wash stuffed toys weekly.  Mold Control Mold and fungi can grow on a variety of surfaces provided certain temperature and moisture conditions exist.  Outdoor molds grow on plants, decaying vegetation and soil. The major outdoor mold, Alternaria and Cladosporium, are found in very high numbers during hot and dry conditions. Generally, a late summer - fall peak is seen for common outdoor fungal spores. Rain will temporarily lower outdoor mold spore count, but counts rise rapidly when the rainy period ends. The most important indoor molds are Aspergillus and Penicillium. Dark, humid and poorly ventilated basements are ideal sites for mold growth. The next most common sites of mold growth are the bathroom and the kitchen. Outdoor (Seasonal) Mold Control Use air conditioning and keep  windows closed. Avoid exposure to decaying vegetation. Avoid leaf  raking. Avoid grain handling. Consider wearing a face mask if working in moldy areas.  Indoor (Perennial) Mold Control  Maintain humidity below 50%. Get rid of mold growth on hard surfaces with water, detergent and, if necessary, 5% bleach (do not mix with other cleaners). Then dry the area completely. If mold covers an area more than 10 square feet, consider hiring an indoor environmental professional. For clothing, washing with soap and water is best. If moldy items cannot be cleaned and dried, throw them away. Remove sources e.g. contaminated carpets. Repair and seal leaking roofs or pipes. Using dehumidifiers in damp basements may be helpful, but empty the water and clean units regularly to prevent mildew from forming. All rooms, especially basements, bathrooms and kitchens, require ventilation and cleaning to deter mold and mildew growth. Avoid carpeting on concrete or damp floors, and storing items in damp areas.  Cockroach Allergen Avoidance Cockroaches are often found in the homes of densely populated urban areas, schools or commercial buildings, but these creatures can lurk almost anywhere. This does not mean that you have a dirty house or living area. Block all areas where roaches can enter the home. This includes crevices, wall cracks and windows.  Cockroaches need water to survive, so fix and seal all leaky faucets and pipes. Have an exterminator go through the house when your family and pets are gone to eliminate any remaining roaches. Keep food in lidded containers and put pet food dishes away after your pets are done eating. Vacuum and sweep the floor after meals, and take out garbage and recyclables. Use lidded garbage containers in the kitchen. Wash dishes immediately after use and clean under stoves, refrigerators or toasters where crumbs can accumulate. Wipe off the stove and other kitchen surfaces and cupboards regularly.

## 2023-07-25 ENCOUNTER — Ambulatory Visit
Admission: EM | Admit: 2023-07-25 | Discharge: 2023-07-25 | Disposition: A | Discharge status: Home / Self Care | Payer: PPO | Source: Home / Self Care

## 2023-07-25 DIAGNOSIS — S60012A Contusion of left thumb without damage to nail, initial encounter: Secondary | ICD-10-CM

## 2023-07-25 DIAGNOSIS — R2232 Localized swelling, mass and lump, left upper limb: Secondary | ICD-10-CM | POA: Diagnosis not present

## 2023-07-25 MED ORDER — DEXAMETHASONE SODIUM PHOSPHATE 10 MG/ML IJ SOLN
10.0000 mg | INTRAMUSCULAR | Status: AC
  Administered 2023-07-25: 10 mg via INTRAMUSCULAR

## 2023-07-25 NOTE — Discharge Instructions (Addendum)
You were given an injection of Decadron 10 mg in the clinic today. Begin Benadryl 1 to 2 tablets (25 to 50 mg) every 6 hours for the next 24 hours. Apply ice to the left thumb to help with swelling and bruising.  Apply for 20 minutes, remove for 1 hour, repeat as much as possible. As discussed, if symptoms appear to be worsening over the next 12 to 24 hours, please follow-up in this clinic or in the emergency department for further evaluation. Follow-up as needed.

## 2023-07-25 NOTE — ED Triage Notes (Signed)
Pt reports he has gotten bit on his left thumb an hr and a half ago, and now it is swollen and purple. States it is painful and numb

## 2023-07-25 NOTE — ED Provider Notes (Signed)
[facility]    CSN: [id number] Arrival date & time: 07/25/23  1301      History   Chief Complaint No chief complaint on file.   HPI [name] [name] is a 77 y.o. male.   The history is provided by the patient.   The patient presents for concerns of an insect bite of the left thumb.  Patient states he was at home, and felt something "bite" the left thumb.  He states since that time, the left thumb has been swelling and is now "purple".  Patient states he did not see what could have bitten him.  He denies fever, chills, chest pain, abdominal pain, nausea, vomiting, diarrhea, decreased range of motion of the left thumb, or tingling.  He reports he has not taken any medication for his symptoms.  Denies history of blood thinners or clotting disorders.  Past Medical History:  Diagnosis Date   Asthma    uses inhaler daily and as needed   Blood transfusion 2012   surgery- esophageal bleeding secondary to hiatal hernia   CKD (chronic kidney disease)    Constipation    Diverticulitis    Diverticulosis    Eczema    Esophageal stricture    GERD (gastroesophageal reflux disease)    HBP (high blood pressure)    on meds   HTN (hypertension)    Hyperlipemia    on meds   Kidney stones    hx of   Seasonal allergies    Trouble swallowing    Ulcer     Patient Active Problem List   Diagnosis Date Noted   Asthma 07/01/2022   Benign prostatic hyperplasia without lower urinary tract symptoms 07/01/2022   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   Chronic kidney disease, 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   History of COVID-19 07/01/2022   History of urinary stone 07/01/2022   Hyperlipidemia 07/01/2022   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   Solitary pulmonary nodule 07/01/2022   Tobacco user 07/01/2022   Vitamin D deficiency 07/01/2022   Pain in lower limb 11/09/2021   Pain of left lower leg 11/09/2021    Gastroesophageal reflux disease 07/30/2019   Low back pain 07/03/2018   Pain in right knee 07/03/2018   Coughing/wheezing 06/15/2018   Perennial allergic rhinitis 02/24/2017   Mild persistent asthma without complication 02/24/2017   Subacute cough 02/24/2017   H/O: GI bleed 08/14/2012   Ulcer of esophagus with bleeding 08/14/2012   S/P Nissen fundoplication (without gastrostomy tube) procedure 11/08/2011    Past Surgical History:  Procedure Laterality Date   COLONOSCOPY  2012   DB-F/V-mira(good)-tics   HIATAL HERNIA REPAIR  1992&2012   LAPAROSCOPIC NISSEN FUNDOPLICATION  1991, 09/24/11   WISDOM TOOTH EXTRACTION         Home Medications    Prior to Admission medications   Medication Sig Start Date End Date Taking? Authorizing Provider  acetaminophen (TYLENOL) 500 MG tablet Take 1,000 mg by mouth at bedtime as needed (pain).    [provider]  albuterol (VENTOLIN HFA) 108 (90 Base) MCG/ACT inhaler Inhale 1-2 puffs into the lungs every 6 (six) hours as needed for wheezing or shortness of breath. 01/13/23   Hetty Blend, FNP  aspirin EC 81 MG tablet Take 1 tablet (81 mg total) by mouth daily. Swallow whole. 12/16/22   Jake Bathe, MD  Azelastine HCl 137 MCG/SPRAY SOLN USE 1 OR 2 SPRAYS INTO [facility]  NOSTRIL TWICE DAILY AS NEEDED FOR RUNNY NOSE/DRAINAGE. 07/01/22   Nehemiah Settle, FNP  Cholecalciferol (VITAMIN D PO) Take 1 tablet by mouth daily.    [provider]  diphenhydrAMINE (BENADRYL) 25 MG tablet Take 25 mg by mouth at bedtime as needed for allergies.    [provider]  fluticasone (FLONASE) 50 MCG/ACT nasal spray USE 2 SPRAYS INTO EACH NOSTRIL ONCE A DAY AS NEEDED FOR STUFFY NOSE 01/27/23   Nehemiah Settle, FNP  fluticasone (FLOVENT HFA) 110 MCG/ACT inhaler Inhale 2 puffs into the lungs in the morning and at bedtime. with spacer and rinse mouth afterwards. 07/14/23   Ellamae Sia, DO  guaiFENesin-codeine [facility]) 100-10 MG/5ML syrup  09/17/21    [provider]  hydroxychloroquine (PLAQUENIL) 200 MG tablet     [provider]  isosorbide mononitrate (IMDUR) 30 MG 24 hr tablet Take 1 tablet (30 mg total) by mouth daily. 12/16/22   Jake Bathe, MD  levocetirizine (XYZAL) 5 MG tablet TAKE 1 TABLET BY MOUTH ONCE DAILY AS NEEDED FOR RUNNY NOSE 03/04/23   Ambs, Norvel Richards, FNP  metoprolol succinate (TOPROL-XL) 25 MG 24 hr tablet TAKE 2 TABLETS (50 MG TOTAL) BY MOUTH DAILY WITH OR IMMEDIATELY FOLLOWING A MEAL 06/02/23   Jake Bathe, MD  montelukast (SINGULAIR) 10 MG tablet Take 1 tablet (10 mg total) by mouth at bedtime. 01/13/23   Hetty Blend, FNP  pantoprazole (PROTONIX) 40 MG tablet TAKE 1 TABLET BY MOUTH EVERY DAY 09/15/17   Hilarie Fredrickson, MD  rosuvastatin (CRESTOR) 10 MG tablet Take 1 tablet (10 mg total) by mouth daily. 12/16/22   Jake Bathe, MD  TAMIFLU 75 MG capsule Take 75 mg by mouth as needed. 12/25/22   [provider]  tamsulosin (FLOMAX) 0.4 MG CAPS capsule TAKE ONE CAPSULE BY MOUTH IN THE MORNING AND AT BEDTIME 11/29/22   McKenzie, Mardene Celeste, MD  triamcinolone cream (KENALOG) 0.1 % SMARTSIG:1 Application Topical 2-3 Times Daily 02/23/20   [provider]  budesonide (PULMICORT FLEXHALER) 180 MCG/ACT inhaler Inhale 2 puffs into the lungs 2 (two) times daily. 09/27/19 09/27/19  Bobbitt, Heywood Iles, MD    Family History Family History  Problem Relation Age of Onset   Heart disease Father 74       heart attack   Colon cancer Neg Hx    Allergic rhinitis Neg Hx    Angioedema Neg Hx    Asthma Neg Hx    Eczema Neg Hx    Immunodeficiency Neg Hx    Urticaria Neg Hx    Colon polyps Neg Hx    Esophageal cancer Neg Hx    Rectal cancer Neg Hx    Stomach cancer Neg Hx     Social History Social History   Tobacco Use   Smoking status: Former    Current packs/day: 0.00    Types: Cigarettes    Quit date: 11/25/1981    Years since quitting: 41.6   Smokeless tobacco: Never  Vaping Use   Vaping  status: Never Used  Substance Use Topics   Alcohol use: Not Currently    Alcohol/week: 0.0 - 3.0 standard drinks of alcohol   Drug use: No     Allergies   Percocet [oxycodone-acetaminophen] and Sulfacetamide sodium   Review of Systems Review of Systems Per HPI  Physical Exam Triage Vital Signs ED Triage Vitals  Encounter Vitals Group     BP 07/25/23 1307 123/69     Systolic  BP Percentile --      Diastolic BP Percentile --      Pulse Rate 07/25/23 1307 (!) 59     Resp 07/25/23 1307 20     Temp 07/25/23 1307 98 F (36.7 C)     Temp Source 07/25/23 1307 Oral     SpO2 07/25/23 1307 96 %     Weight --      Height --      Head Circumference --      Peak Flow --      Pain Score 07/25/23 1308 3     Pain Loc --      Pain Education --      Exclude from Growth Chart --    No data found.  Updated Vital Signs BP 123/69 (BP Location: Right Arm)   Pulse (!) 59   Temp 98 F (36.7 C) (Oral)   Resp 20   SpO2 96%   Visual Acuity Right Eye Distance:   Left Eye Distance:   Bilateral Distance:    Right Eye Near:   Left Eye Near:    Bilateral Near:     Physical Exam Vitals and nursing note reviewed.  Constitutional:      General: He is not in acute distress.    Appearance: Normal appearance.  HENT:     Head: Normocephalic.  Eyes:     Extraocular Movements: Extraocular movements intact.     Pupils: Pupils are equal, round, and reactive to light.  Pulmonary:     Effort: Pulmonary effort is normal.  Musculoskeletal:     Left hand: Swelling and tenderness present. Normal range of motion. Normal capillary refill. Normal pulse.     Cervical back: Normal range of motion.     Comments: Swelling noted to the thumb pad of the left thumb.  Area is dark in color, consistent with contusion/hematoma.  Lymphadenopathy:     Cervical: No cervical adenopathy.  Skin:    General: Skin is warm and dry.  Neurological:     General: No focal deficit present.     Mental Status: He is  alert and oriented to person, place, and time.  Psychiatric:        Mood and Affect: Mood normal.        Behavior: Behavior normal.      UC Treatments / Results  Labs (all labs ordered are listed, but only abnormal results are displayed) Labs Reviewed - No data to display  EKG   Radiology No results found.  Procedures Procedures (including critical care time)  Medications Ordered in UC Medications  dexamethasone (DECADRON) injection 10 mg (has no administration in time range)    Initial Impression / Assessment and Plan / UC Course  I have reviewed the triage vital signs and the nursing notes.  Pertinent labs & imaging results that were available during my care of the patient were reviewed by me and considered in my medical decision making (see chart for details).  The patient is well-appearing, he is in no acute distress, vital signs are stable.  Difficult to determine the cause of the swelling and contusion/hematoma to the patient's left thumb.  Did not suspect that it was an insect bite; however, cannot rule that out.  Will treat patient for inflammation with Decadron 10 mg IM.  Supportive care recommendations were provided and discussed with the patient to include over-the-counter antihistamines, and icing the left thumb.  Patient was advised to follow-up if symptoms worsen over the next  12 to 24 hours.  Patient was in agreement with this plan of care and verbalizes understanding.  All questions were answered.  Patient stable for discharge.  Final Clinical Impressions(s) / UC Diagnoses   Final diagnoses:  Localized swelling of left thumb   Discharge Instructions   None    ED Prescriptions   None    PDMP not reviewed this encounter.   Abran Cantor, NP 07/25/23 1339

## 2023-08-11 ENCOUNTER — Ambulatory Visit: Payer: PPO | Admitting: Podiatry

## 2023-08-11 ENCOUNTER — Encounter: Payer: Self-pay | Admitting: Podiatry

## 2023-08-11 DIAGNOSIS — D2372 Other benign neoplasm of skin of left lower limb, including hip: Secondary | ICD-10-CM | POA: Diagnosis not present

## 2023-08-11 DIAGNOSIS — M216X2 Other acquired deformities of left foot: Secondary | ICD-10-CM

## 2023-08-11 NOTE — Progress Notes (Signed)
Subjective:   Patient ID: [name] [name], male   DOB: 77 y.o.   MRN: [id number]   HPI Patient presents painful lesion plantar aspect left stated it was better for around 5 months   ROS      Objective:  Physical Exam  Neurovascular status intact significant lesion plantar left pinpoint bleeding upon debridement and also noted to have moderate plantarflexed metatarsals     Assessment:  Combination of lesion with verruca plantaris and prominent metatarsals     Plan:  H&P reviewed debrided lesion apply chemical agent to create immune response sterile dressing gave instructions on what to do if blistering were to occur and discussed possibility for orthotics or possible osteotomy if symptoms get worse there

## 2023-09-11 ENCOUNTER — Telehealth: Payer: Self-pay | Admitting: Internal Medicine

## 2023-09-11 NOTE — Telephone Encounter (Signed)
Pt states he thinks he has a hernia that has popped out in her lower abdomen. Discussed with him that he would need to be referred to a surgeon for that type of hernia. Discussed with him that he could contact his PCP to get a referral to [facility]. Pt verbalized understanding.

## 2023-09-11 NOTE — Telephone Encounter (Signed)
Inbound call from patient stating that he believes that he has a hernia. Patient is requesting a call to discuss his concerns. Please advise.

## 2023-09-17 ENCOUNTER — Other Ambulatory Visit: Payer: Self-pay

## 2023-09-17 DIAGNOSIS — R1909 Other intra-abdominal and pelvic swelling, mass and lump: Secondary | ICD-10-CM

## 2023-09-17 DIAGNOSIS — R1031 Right lower quadrant pain: Secondary | ICD-10-CM

## 2023-09-19 ENCOUNTER — Ambulatory Visit
Admission: RE | Admit: 2023-09-19 | Discharge: 2023-09-19 | Disposition: A | Discharge status: Home / Self Care | Payer: PPO | Source: Ambulatory Visit

## 2023-09-19 DIAGNOSIS — R1909 Other intra-abdominal and pelvic swelling, mass and lump: Secondary | ICD-10-CM

## 2023-09-19 DIAGNOSIS — R1031 Right lower quadrant pain: Secondary | ICD-10-CM

## 2023-10-07 LAB — LAB REPORT - SCANNED: EGFR: 63

## 2023-10-09 ENCOUNTER — Ambulatory Visit: Payer: Self-pay | Admitting: General Surgery

## 2023-10-14 ENCOUNTER — Ambulatory Visit: Payer: PPO | Attending: Cardiology | Admitting: Cardiology

## 2023-10-14 ENCOUNTER — Encounter: Payer: Self-pay | Admitting: Cardiology

## 2023-10-14 VITALS — BP 116/60 | HR 62 | Ht 71.0 in | Wt 186.0 lb

## 2023-10-14 DIAGNOSIS — E7800 Pure hypercholesterolemia, unspecified: Secondary | ICD-10-CM | POA: Diagnosis not present

## 2023-10-14 DIAGNOSIS — I10 Essential (primary) hypertension: Secondary | ICD-10-CM | POA: Diagnosis not present

## 2023-10-14 DIAGNOSIS — I2510 Atherosclerotic heart disease of native coronary artery without angina pectoris: Secondary | ICD-10-CM | POA: Diagnosis not present

## 2023-10-14 NOTE — Progress Notes (Signed)
Cardiology Office Note:  .   Date:  10/14/2023  ID:  [name] [name], DOB Feb 10, 1946, MRN [id number] PCP: Linus Galas, NP  [facility] Providers Cardiologist:  Donato Schultz, MD     History of Present Illness: .   [name] [name] is a 77 y.o. male Discussed the use of AI scribe   History of Present Illness   The 77 year old patient with a history of coronary artery disease, hypertension, and mild tricuspid and mitral regurgitation presents for a follow-up visit. The patient has been managing a circumflex lesion identified on CT and FFR positive with medications including toprol, oxalozole, isosorbide, and Crestor for plaque stabilization. The patient reports tolerating these medications well.  For hypertension, the patient's medication was switched from losartan 50 mg to irbesartan 50/12.5 mg of hydrochlorothiazide. However, the hydrochlorothiazide was discontinued in July 2024 due to episodes of low blood pressure, with readings as low as 99/56. The patient reports variable blood pressure readings, with higher readings noted during doctor's visits.  The patient also mentions a change in the administration of metoprolol, from taking two tablets in the morning to one in the morning and one in the evening, which has reportedly improved his tolerance of the medication.  The patient has a strong family history of myocardial infarction, with his father having suffered a fatal MI in his forties. The patient follows a Mediterranean diet and exercises regularly. The patient's ejection fraction was normal at 55%, and a prior calcium score was 270. The patient's LDL was 53 on Crestor 10 mg.  The patient also mentions an upcoming hernia surgery. No new symptoms or changes in the patient's condition were reported during the visit.          ROS: No CP  Studies Reviewed: [name]   EKG Interpretation Date/Time:  Tuesday October 14 2023 09:41:23 EST Ventricular Rate:  61 PR Interval:  160 QRS  Duration:  82 QT Interval:  430 QTC Calculation: 432 R Axis:   47  Text Interpretation: Normal sinus rhythm Normal ECG No significant change since last tracing Confirmed by Donato Schultz ([id number]) on 10/14/2023 9:48:24 AM    Results LABS LDL: 53 LDL: 68 (10/07/2023)  RADIOLOGY CT: circumflex lesion, FFR positive  DIAGNOSTIC Echocardiogram: mild tricuspid and mitral regurgitation Ejection fraction: 55% Calcium score: 270 EKG: Normal  Risk Assessment/Calculations:            Physical Exam:   VS:  BP 116/60   Pulse 62   Ht 5\' 11"  (1.803 m)   Wt 186 lb (84.4 kg)   SpO2 97%   BMI 25.94 kg/m    Wt Readings from Last 3 Encounters:  10/14/23 186 lb (84.4 kg)  07/14/23 180 lb 8 oz (81.9 kg)  02/25/23 183 lb (83 kg)    GEN: Well nourished, well developed in no acute distress NECK: No JVD; No carotid bruits CARDIAC: RRR, no murmurs, no rubs, no gallops RESPIRATORY:  Clear to auscultation without rales, wheezing or rhonchi  ABDOMEN: Soft, non-tender, non-distended EXTREMITIES:  No edema; No deformity   ASSESSMENT AND PLAN: .    Assessment and Plan    Coronary Artery Disease (CAD) Circumflex lesion on CT was FFR positive. Currently on metoprolol, isosorbide, and rosuvastatin (Crestor) for plaque stabilization. Tolerating medications well. LDL at 53, within target range. Strong family history of myocardial infarction; father had MI in his forties. Ejection fraction is normal at 55%. Discussed potential intervention if symptoms worsen or pressures change. Prefers current medical management. -  Continue metoprolol, isosorbide, and rosuvastatin - Monitor blood pressure at home - Report any significant changes in symptoms or blood pressure  Hypertension Fluctuating blood pressure readings. Switched from losartan to irbesartan/hydrochlorothiazide but experienced hypotension, leading to discontinuation of hydrochlorothiazide. Recent readings variable, likely due to white coat  hypertension. Comfortable with home readings in the 110-120 range. - Continue irbesartan 50 mg - Monitor blood pressure at home - Report any significant changes in blood pressure or symptoms of dizziness or fainting  Tricuspid and Mitral Regurgitation Mild tricuspid and mitral regurgitation noted on echocardiogram. No current symptoms warranting immediate intervention. - Continue current management and monitoring - Report any new symptoms such as worsening shortness of breath or fatigue  General Health Maintenance Following a Mediterranean diet and engaging in regular exercise. On aspirin 81 mg for cardiovascular protection. - Continue Mediterranean diet and regular exercise - Continue aspirin 81 mg daily  Follow-up - Follow up with primary care physician and cardiologist as needed - Advise spouse to follow up with primary care physician for Coumadin management.               Signed, Donato Schultz, MD

## 2023-10-14 NOTE — Patient Instructions (Signed)
 Medication Instructions:  The current medical regimen is effective;  continue present plan and medications.  *If you need a refill on your cardiac medications before your next appointment, please call your pharmacy*  Follow-Up: At [facility], you and your health needs are our priority.  As part of our continuing mission to provide you with exceptional heart care, we have created designated Provider Care Teams.  These Care Teams include your primary Cardiologist (physician) and Advanced Practice Providers (APPs -  Physician Assistants and Nurse Practitioners) who all work together to provide you with the care you need, when you need it.  We recommend signing up for the patient portal called "MyChart".  Sign up information is provided on this After Visit Summary.  MyChart is used to connect with patients for Virtual Visits (Telemedicine).  Patients are able to view lab/test results, encounter notes, upcoming appointments, etc.  Non-urgent messages can be sent to your provider as well.   To learn more about what you can do with MyChart, go to [email or web address].    Your next appointment:   6 month(s)  Provider:   Jari Favre, PA-C, Robin Searing, NP, Jacolyn Reedy, PA-C, Eligha Bridegroom, NP, Tereso Newcomer, PA-C, or Perlie Gold, PA-C

## 2023-11-08 ENCOUNTER — Ambulatory Visit
Admission: EM | Admit: 2023-11-08 | Discharge: 2023-11-08 | Disposition: A | Discharge status: Home / Self Care | Payer: PPO | Attending: Family Medicine | Admitting: Family Medicine

## 2023-11-08 DIAGNOSIS — J069 Acute upper respiratory infection, unspecified: Secondary | ICD-10-CM | POA: Diagnosis not present

## 2023-11-08 DIAGNOSIS — J4541 Moderate persistent asthma with (acute) exacerbation: Secondary | ICD-10-CM | POA: Diagnosis not present

## 2023-11-08 LAB — POC COVID19/FLU A&B COMBO
Covid Antigen, POC: NEGATIVE
Influenza A Antigen, POC: NEGATIVE
Influenza B Antigen, POC: NEGATIVE

## 2023-11-08 MED ORDER — DEXAMETHASONE SODIUM PHOSPHATE 10 MG/ML IJ SOLN
10.0000 mg | Freq: Once | INTRAMUSCULAR | Status: AC
  Administered 2023-11-08: 10 mg via INTRAMUSCULAR

## 2023-11-08 MED ORDER — PROMETHAZINE-DM 6.25-15 MG/5ML PO SYRP
5.0000 mL | ORAL_SOLUTION | Freq: Four times a day (QID) | ORAL | 0 refills | Status: DC | PRN
Start: 2023-11-08 — End: 2024-07-14

## 2023-11-08 NOTE — ED Provider Notes (Signed)
[facility]    CSN: [id number] Arrival date & time: 11/08/23  1021      History   Chief Complaint Chief Complaint  Patient presents with   Nasal Congestion    HPI [name] [name] is a 77 y.o. male.   Patient presenting today with 3-day history of cough, congestion, fatigue, chills.  Denies known fever, chest pain, significant shortness of breath or using his inhaler more than usual, abdominal pain, nausea vomiting or diarrhea.  Currently on Flovent and albuterol as needed for asthma.  No known sick contacts recently.    Past Medical History:  Diagnosis Date   Asthma    uses inhaler daily and as needed   Blood transfusion 2012   surgery- esophageal bleeding secondary to hiatal hernia   CKD (chronic kidney disease)    Constipation    Diverticulitis    Diverticulosis    Eczema    Esophageal stricture    GERD (gastroesophageal reflux disease)    HBP (high blood pressure)    on meds   HTN (hypertension)    Hyperlipemia    on meds   Kidney stones    hx of   Seasonal allergies    Trouble swallowing    Ulcer     Patient Active Problem List   Diagnosis Date Noted   Asthma 07/01/2022   Benign prostatic hyperplasia without lower urinary tract symptoms 07/01/2022   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   Chronic kidney disease, 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   History of COVID-19 07/01/2022   History of urinary stone 07/01/2022   Hyperlipidemia 07/01/2022   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   Solitary pulmonary nodule 07/01/2022   Tobacco user 07/01/2022   Vitamin D deficiency 07/01/2022   Pain in lower limb 11/09/2021   Pain of left lower leg 11/09/2021   Gastroesophageal reflux disease 07/30/2019   Low back pain 07/03/2018   Pain in right knee 07/03/2018   Coughing/wheezing 06/15/2018   Perennial allergic rhinitis 02/24/2017   Mild persistent asthma without complication  02/24/2017   Subacute cough 02/24/2017   H/O: GI bleed 08/14/2012   Ulcer of esophagus with bleeding 08/14/2012   S/P Nissen fundoplication (without gastrostomy tube) procedure 11/08/2011    Past Surgical History:  Procedure Laterality Date   COLONOSCOPY  2012   DB-F/V-mira(good)-tics   HIATAL HERNIA REPAIR  1992&2012   LAPAROSCOPIC NISSEN FUNDOPLICATION  1991, 09/24/11   WISDOM TOOTH EXTRACTION         Home Medications    Prior to Admission medications   Medication Sig Start Date End Date Taking? Authorizing Provider  promethazine-dextromethorphan (PROMETHAZINE-DM) 6.25-15 MG/5ML syrup Take 5 mLs by mouth 4 (four) times daily as needed. 11/08/23  Yes Particia Nearing, PA-C  acetaminophen (TYLENOL) 500 MG tablet Take 1,000 mg by mouth at bedtime as needed (pain).    [provider]  albuterol (VENTOLIN HFA) 108 (90 Base) MCG/ACT inhaler Inhale 1-2 puffs into the lungs every 6 (six) hours as needed for wheezing or shortness of breath. 01/13/23   Hetty Blend, FNP  aspirin EC 81 MG tablet Take 1 tablet (81 mg total) by mouth daily. Swallow whole. 12/16/22   Jake Bathe, MD  Azelastine HCl 137 MCG/SPRAY SOLN USE 1 OR 2 SPRAYS INTO EACH NOSTRIL TWICE DAILY AS NEEDED FOR RUNNY NOSE/DRAINAGE. 07/01/22   Nehemiah Settle, FNP  Cholecalciferol (VITAMIN D PO) Take 1 tablet by mouth daily.  [provider]  diphenhydrAMINE (BENADRYL) 25 MG tablet Take 25 mg by mouth at bedtime as needed for allergies.    [provider]  fluticasone (FLONASE) 50 MCG/ACT nasal spray USE 2 SPRAYS INTO EACH NOSTRIL ONCE A DAY AS NEEDED FOR STUFFY NOSE 01/27/23   Nehemiah Settle, FNP  fluticasone (FLOVENT HFA) 110 MCG/ACT inhaler Inhale 2 puffs into the lungs in the morning and at bedtime. with spacer and rinse mouth afterwards. 07/14/23   Ellamae Sia, DO  guaiFENesin-codeine [facility]) 100-10 MG/5ML syrup  09/17/21   [provider]  hydroxychloroquine (PLAQUENIL) 200  MG tablet     [provider]  isosorbide mononitrate (IMDUR) 30 MG 24 hr tablet Take 1 tablet (30 mg total) by mouth daily. 12/16/22   Jake Bathe, MD  levocetirizine (XYZAL) 5 MG tablet TAKE 1 TABLET BY MOUTH ONCE DAILY AS NEEDED FOR RUNNY NOSE 03/04/23   Ambs, Norvel Richards, FNP  metoprolol succinate (TOPROL-XL) 25 MG 24 hr tablet TAKE 2 TABLETS (50 MG TOTAL) BY MOUTH DAILY WITH OR IMMEDIATELY FOLLOWING A MEAL 06/02/23   Jake Bathe, MD  montelukast (SINGULAIR) 10 MG tablet Take 1 tablet (10 mg total) by mouth at bedtime. 01/13/23   Hetty Blend, FNP  pantoprazole (PROTONIX) 40 MG tablet TAKE 1 TABLET BY MOUTH EVERY DAY 09/15/17   Hilarie Fredrickson, MD  rosuvastatin (CRESTOR) 10 MG tablet Take 1 tablet (10 mg total) by mouth daily. 12/16/22   Jake Bathe, MD  TAMIFLU 75 MG capsule Take 75 mg by mouth as needed. 12/25/22   [provider]  tamsulosin (FLOMAX) 0.4 MG CAPS capsule TAKE ONE CAPSULE BY MOUTH IN THE MORNING AND AT BEDTIME 11/29/22   McKenzie, Mardene Celeste, MD  triamcinolone cream (KENALOG) 0.1 % SMARTSIG:1 Application Topical 2-3 Times Daily 02/23/20   [provider]  budesonide (PULMICORT FLEXHALER) 180 MCG/ACT inhaler Inhale 2 puffs into the lungs 2 (two) times daily. 09/27/19 09/27/19  Bobbitt, Heywood Iles, MD    Family History Family History  Problem Relation Age of Onset   Heart disease Father 39       heart attack   Colon cancer Neg Hx    Allergic rhinitis Neg Hx    Angioedema Neg Hx    Asthma Neg Hx    Eczema Neg Hx    Immunodeficiency Neg Hx    Urticaria Neg Hx    Colon polyps Neg Hx    Esophageal cancer Neg Hx    Rectal cancer Neg Hx    Stomach cancer Neg Hx     Social History Social History   Tobacco Use   Smoking status: Former    Current packs/day: 0.00    Types: Cigarettes    Quit date: 11/25/1981    Years since quitting: 41.9   Smokeless tobacco: Never  Vaping Use   Vaping status: Never Used  Substance Use Topics   Alcohol use: Not  Currently    Alcohol/week: 0.0 - 3.0 standard drinks of alcohol   Drug use: No     Allergies   Percocet [oxycodone-acetaminophen] and Sulfacetamide sodium   Review of Systems Review of Systems Per HPI  Physical Exam Triage Vital Signs ED Triage Vitals  Encounter Vitals Group     BP 11/08/23 1034 116/70     Systolic BP Percentile --      Diastolic BP Percentile --      Pulse Rate 11/08/23 1034 65     Resp 11/08/23  1034 16     Temp 11/08/23 1034 98.5 F (36.9 C)     Temp Source 11/08/23 1034 Oral     SpO2 11/08/23 1034 95 %     Weight --      Height --      Head Circumference --      Peak Flow --      Pain Score 11/08/23 1036 0     Pain Loc --      Pain Education --      Exclude from Growth Chart --    No data found.  Updated Vital Signs BP 116/70 (BP Location: Right Arm)   Pulse 65   Temp 98.5 F (36.9 C) (Oral)   Resp 16   SpO2 95%   Visual Acuity Right Eye Distance:   Left Eye Distance:   Bilateral Distance:    Right Eye Near:   Left Eye Near:    Bilateral Near:     Physical Exam Vitals and nursing note reviewed.  Constitutional:      Appearance: He is well-developed.  HENT:     Head: Atraumatic.     Right Ear: External ear normal.     Left Ear: External ear normal.     Nose: Rhinorrhea present.     Mouth/Throat:     Pharynx: Posterior oropharyngeal erythema present. No oropharyngeal exudate.  Eyes:     Conjunctiva/sclera: Conjunctivae normal.     Pupils: Pupils are equal, round, and reactive to light.  Cardiovascular:     Rate and Rhythm: Normal rate and regular rhythm.  Pulmonary:     Effort: Pulmonary effort is normal. No respiratory distress.     Breath sounds: No wheezing or rales.  Musculoskeletal:        General: Normal range of motion.     Cervical back: Normal range of motion and neck supple.  Lymphadenopathy:     Cervical: No cervical adenopathy.  Skin:    General: Skin is warm and dry.  Neurological:     Mental Status: He  is alert and oriented to person, place, and time.  Psychiatric:        Behavior: Behavior normal.      UC Treatments / Results  Labs (all labs ordered are listed, but only abnormal results are displayed) Labs Reviewed  POC COVID19/FLU A&B COMBO    EKG   Radiology No results found.  Procedures Procedures (including critical care time)  Medications Ordered in UC Medications  dexamethasone (DECADRON) injection 10 mg (10 mg Intramuscular Given 11/08/23 1224)    Initial Impression / Assessment and Plan / UC Course  I have reviewed the triage vital signs and the nursing notes.  Pertinent labs & imaging results that were available during my care of the patient were reviewed by me and considered in my medical decision making (see chart for details).     Vitals and exam overall reassuring today, COVID and flu negative, suspect viral respiratory infection causing asthma exacerbation.  Treat with IM Decadron, Phenergan DM, continued albuterol and Flovent regimen, supportive over-the-counter medications and home care.  Return for worsening symptoms.  Final Clinical Impressions(s) / UC Diagnoses   Final diagnoses:  Viral URI with cough  Moderate persistent asthma with acute exacerbation     Discharge Instructions      Your COVID and flu testing was negative.  I suspect you have another one of the viruses going around.  We have given you a steroid shot today and I  have sent over a good cough syrup.  Continue your inhaler regimen, and you may take Coricidin HBP, plain Mucinex, Flonase, sinus rinses, humidifiers, warm honey tea.    ED Prescriptions     Medication Sig Dispense Auth. Provider   promethazine-dextromethorphan (PROMETHAZINE-DM) 6.25-15 MG/5ML syrup Take 5 mLs by mouth 4 (four) times daily as needed. 100 mL Particia Nearing, [place]      PDMP not reviewed this encounter.   Particia Nearing, [place] 11/08/23 1320

## 2023-11-08 NOTE — ED Triage Notes (Signed)
Pt states cough and nasal congestion for the past 3 days.

## 2023-11-08 NOTE — Discharge Instructions (Signed)
Your COVID and flu testing was negative.  I suspect you have another one of the viruses going around.  We have given you a steroid shot today and I have sent over a good cough syrup.  Continue your inhaler regimen, and you may take Coricidin HBP, plain Mucinex, Flonase, sinus rinses, humidifiers, warm honey tea.

## 2023-11-16 ENCOUNTER — Other Ambulatory Visit: Payer: Self-pay | Admitting: Allergy

## 2023-11-22 ENCOUNTER — Other Ambulatory Visit: Payer: Self-pay | Admitting: Cardiology

## 2023-11-22 DIAGNOSIS — I10 Essential (primary) hypertension: Secondary | ICD-10-CM

## 2023-12-01 ENCOUNTER — Ambulatory Visit: Payer: PPO | Admitting: Urology

## 2023-12-03 NOTE — Patient Instructions (Addendum)
 SURGICAL WAITING ROOM VISITATION  Patients having surgery or a procedure may have no more than 2 support people in the waiting area - these visitors may rotate.    Children under the age of 25 must have an adult with them who is not the patient.   If the patient needs to stay at the hospital during part of their recovery, the visitor guidelines for inpatient rooms apply. Pre-op nurse will coordinate an appropriate time for 1 support person to accompany patient in pre-op.  This support person may not rotate.    Please refer to the [facility] website for the visitor guidelines for Inpatients (after your surgery is over and you are in a regular room).       Your procedure is scheduled on: MONDAY   December 22, 2023     Report to admitting at     09:45 AM   Call this number if you have problems the morning of surgery [phone]   Do not eat or drink anything :After Midnight.before your surgery.               If you have questions, please contact your surgeon's office.     FOLLOW  ANY ADDITIONAL PRE OP INSTRUCTIONS YOU RECEIVED FROM YOUR SURGEON'S OFFICE!!!     Oral Hygiene is also important to reduce your risk of infection.                                    Remember - BRUSH YOUR TEETH THE MORNING OF SURGERY WITH YOUR REGULAR TOOTHPASTE  DENTURES WILL BE REMOVED PRIOR TO SURGERY PLEASE DO NOT APPLY Poly grip OR ADHESIVES!!!   Do NOT smoke after Midnight   Stop all vitamins and herbal supplements 7 days before surgery.   Take these medicines the morning of surgery with A SIP OF WATER:  tamsulosin , metoprolol , Isosorbide  mononitrate (Imdur ), You may take pantoprazole  if needed. You may use your Albuterol  inhaler if needed (please bring this inhaler with you on the day of surgery)   You may use your Flonase  and Astelin  nasal spray if needed.                              You may not have any metal on your body including  jewelry, and body piercing             Do not wear   lotions, powders, /cologne, or deodorant.               Men may shave face and neck.   Do not bring valuables to the hospital. [facility] IS NOT             RESPONSIBLE   FOR VALUABLES.   Contacts, glasses, dentures or bridgework may not be worn into surgery.  DO NOT BRING YOUR HOME MEDICATIONS TO THE HOSPITAL. PHARMACY WILL DISPENSE MEDICATIONS LISTED ON YOUR MEDICATION LIST TO YOU DURING YOUR ADMISSION IN THE HOSPITAL!    Patients discharged on the day of surgery will not be allowed to drive home.  Someone NEEDS to stay with you for the first 24 hours after anesthesia.   Special Instructions: Bring a copy of your healthcare power of attorney and living will documents the day of surgery if you haven't scanned them before.  Please read over the following fact sheets you were given: IF YOU HAVE QUESTIONS ABOUT YOUR PRE-OP INSTRUCTIONS PLEASE CALL [phone]   If you test positive for Covid or have been in contact with anyone that has tested positive in the last 10 days please notify you surgeon.     [facility] - Preparing for Surgery Before surgery, you can play an important role.  Because skin is not sterile, your skin needs to be as free of germs as possible.  You can reduce the number of germs on your skin by washing with CHG (chlorahexidine gluconate) soap before surgery.  CHG is an antiseptic cleaner which kills germs and bonds with the skin to continue killing germs even after washing. Please DO NOT use if you have an allergy to CHG or antibacterial soaps.  If your skin becomes reddened/irritated stop using the CHG and inform your nurse when you arrive at Short Stay.  Do not shave (including legs and underarms) for at least 48 hours prior to the first CHG shower.  You may shave your face/neck. Please follow these instructions carefully:   1.  Shower with CHG Soap the night before surgery and the  morning of Surgery.  2.  If you choose to wash your hair, wash your  hair first as usual with your  normal  shampoo.  3.  After you shampoo, rinse your hair and body thoroughly to remove the  shampoo.             4.  Use CHG as you would any other liquid soap.  You can apply chg directly  to the skin and wash                       Gently with a scrungie or clean washcloth.  5.  Apply the CHG Soap to your body ONLY FROM THE NECK DOWN.   Do not use on face/ open                           Wound or open sores. Avoid contact with eyes, ears mouth and genitals (private parts).                       Wash face,  Genitals (private parts) with your normal soap.             6.  Wash thoroughly, paying special attention to the area where your surgery  will be performed.  7.  Thoroughly rinse your body with warm water from the neck down.  8.  DO NOT shower/wash with your normal soap after using and rinsing off  the CHG Soap.              9.  Pat yourself dry with a clean towel.            10.  Wear clean pajamas.            11.  Place clean sheets on your bed the night of your first shower and do not  sleep with pets.  Day of Surgery : Do not apply any lotions/deodorants the morning of surgery.  Please wear clean clothes to the hospital/surgery center.  FAILURE TO FOLLOW THESE INSTRUCTIONS MAY RESULT IN THE CANCELLATION OF YOUR SURGERY PATIENT SIGNATURE_________________________________  NURSE SIGNATURE__________________________________  ________________________________________________________________________

## 2023-12-03 NOTE — Progress Notes (Signed)
 PCP - LOV Izetta Cork, NP 10-13-23 media Cardiologist - Oneil Parchment, MD  LOV 10-14-23 epic  PPM/ICD -  Device Orders -  Rep Notified -   Chest x-ray - CT chest 09-03-22 EKG - 10-14-23 epic Stress Test -  ECHO -  Cardiac Cath -  CT coronary 12-12-22 epic  Sleep Study -  CPAP -   Fasting Blood Sugar -  Checks Blood Sugar _____ times a day  Blood Thinner Instructions: Aspirin  Instructions:  ERAS Protcol - PRE-SURGERY Ensure or G2- n/a   COVID vaccine -  Activity-- Anesthesia review: CAD, HTN, asthma, mild tricuspid and mitral regurig,Severe aortic stenosis noted in PCP note no echo in epic  Patient denies shortness of breath, fever, cough and chest pain at PAT appointment   All instructions explained to the patient, with a verbal understanding of the material. Patient agrees to go over the instructions while at home for a better understanding. Patient also instructed to self quarantine after being tested for COVID-19. The opportunity to ask questions was provided.

## 2023-12-07 NOTE — Progress Notes (Addendum)
 COVID Vaccine received:  []  No [x]  Yes Date of any COVID positive Test in last 90 days:  None  PCP - Izetta Cork, NP  medical clearance in 10-13-2023 Media note  [phone] [phone]  Cardiologist - Oneil Parchment, MD  LOV 10-14-2023 Epic note  Chest x-ray - CT chest 09-03-2022  Epic EKG -  10-14-2023  Epic Stress Test -  ECHO -  Cardiac Cath -  CT coronary calcium  score: 219 on 12-12-2022  Epic  PCR screen: []  Ordered & Completed []   No Order but Needs PROFEND     [x]   N/A for this surgery  Surgery Plan:  [x]  Ambulatory   []  Outpatient in bed  []  Admit Anesthesia:    []  General  []  Spinal  [x]   Choice []   MAC  Bowel Prep - [x]  No  []   Yes ______  Pacemaker / ICD device [x]  No []  Yes   Spinal Cord Stimulator:[x]  No []  Yes       History of Sleep Apnea? [x]  No []  Yes   CPAP used?- [x]  No []  Yes    Does the patient monitor blood sugar?  []  N/A   [x]  No []  Yes  Patient has: []  NO Hx DM   [x]  Pre-DM   []  DM1  []   DM2  Blood Thinner / Instructions:  None Aspirin  Instructions:  ASA 81 mg-  already stopped  ERAS Protocol Ordered: [x]  No  []  Yes Patient is to be NPO after: Midnight prior  Dental hx: []  Dentures:  [x]  N/A      []  Bridge or Partial:                   []  Loose or Damaged teeth:   Activity level: Patient is able to climb a flight of stairs without difficulty; [x]  No CP but would have some SOB.  Patient can perform ADLs without assistance.   Anesthesia review: CAD, HTN, asthma, mild tricuspid and mitral regurig,  Severe aortic stenosis noted in PCP note but not in Dr Parchment note / no echo in epic   Patient denies shortness of breath, fever, cough and chest pain at PAT appointment.  Patient verbalized understanding and agreement to the Pre-Surgical Instructions that were given to them at this PAT appointment. Patient was also educated of the need to review these PAT instructions again prior to his surgery.I reviewed the appropriate phone numbers to call if they  have any and questions or concerns.

## 2023-12-08 ENCOUNTER — Other Ambulatory Visit: Payer: Self-pay

## 2023-12-08 ENCOUNTER — Other Ambulatory Visit: Payer: Self-pay | Admitting: Family Medicine

## 2023-12-08 ENCOUNTER — Encounter (HOSPITAL_COMMUNITY): Payer: Self-pay

## 2023-12-08 ENCOUNTER — Encounter (HOSPITAL_COMMUNITY)
Admission: RE | Admit: 2023-12-08 | Discharge: 2023-12-08 | Disposition: A | Discharge status: Home / Self Care | Payer: PPO | Source: Ambulatory Visit | Attending: General Surgery | Admitting: General Surgery

## 2023-12-08 VITALS — BP 108/64 | HR 62 | Temp 97.9°F | Resp 14 | Ht 71.0 in | Wt 183.0 lb

## 2023-12-08 DIAGNOSIS — I10 Essential (primary) hypertension: Secondary | ICD-10-CM | POA: Insufficient documentation

## 2023-12-08 DIAGNOSIS — Z01812 Encounter for preprocedural laboratory examination: Secondary | ICD-10-CM | POA: Diagnosis not present

## 2023-12-08 DIAGNOSIS — M9903 Segmental and somatic dysfunction of lumbar region: Secondary | ICD-10-CM | POA: Diagnosis not present

## 2023-12-08 DIAGNOSIS — M5136 Other intervertebral disc degeneration, lumbar region with discogenic back pain only: Secondary | ICD-10-CM | POA: Diagnosis not present

## 2023-12-08 DIAGNOSIS — M9904 Segmental and somatic dysfunction of sacral region: Secondary | ICD-10-CM | POA: Diagnosis not present

## 2023-12-08 DIAGNOSIS — M9905 Segmental and somatic dysfunction of pelvic region: Secondary | ICD-10-CM | POA: Diagnosis not present

## 2023-12-08 HISTORY — DX: Atherosclerotic heart disease of native coronary artery without angina pectoris: I25.10

## 2023-12-08 HISTORY — DX: Cardiac murmur, unspecified: R01.1

## 2023-12-08 HISTORY — DX: Unspecified osteoarthritis, unspecified site: M19.90

## 2023-12-08 HISTORY — DX: Personal history of urinary calculi: Z87.442

## 2023-12-08 HISTORY — DX: Prediabetes: R73.03

## 2023-12-08 LAB — BASIC METABOLIC PANEL
Anion gap: 7 (ref 5–15)
BUN: 13 mg/dL (ref 8–23)
CO2: 25 mmol/L (ref 22–32)
Calcium: 8.8 mg/dL — ABNORMAL LOW (ref 8.9–10.3)
Chloride: 103 mmol/L (ref 98–111)
Creatinine, Ser: 1.17 mg/dL (ref 0.61–1.24)
GFR, Estimated: >60 mL/min (ref >60)
Glucose, Bld: 110 mg/dL — ABNORMAL HIGH (ref 70–99)
Potassium: 4.3 mmol/L (ref 3.5–5.1)
Sodium: 135 mmol/L (ref 135–145)

## 2023-12-08 LAB — CBC
HCT: 44.3 % (ref 39.0–52.0)
Hemoglobin: 14.5 g/dL (ref 13.0–17.0)
MCH: 31 pg (ref 26.0–34.0)
MCHC: 32.7 g/dL (ref 30.0–36.0)
MCV: 94.7 fL (ref 80.0–100.0)
Platelets: 184 10*3/uL (ref 150–400)
RBC: 4.68 MIL/uL (ref 4.22–5.81)
RDW: 13.2 % (ref 11.5–15.5)
WBC: 5.2 10*3/uL (ref 4.0–10.5)
nRBC: 0 % (ref 0.0–0.2)

## 2023-12-11 DIAGNOSIS — M9905 Segmental and somatic dysfunction of pelvic region: Secondary | ICD-10-CM | POA: Diagnosis not present

## 2023-12-11 DIAGNOSIS — M9904 Segmental and somatic dysfunction of sacral region: Secondary | ICD-10-CM | POA: Diagnosis not present

## 2023-12-11 DIAGNOSIS — M9903 Segmental and somatic dysfunction of lumbar region: Secondary | ICD-10-CM | POA: Diagnosis not present

## 2023-12-11 DIAGNOSIS — M5136 Other intervertebral disc degeneration, lumbar region with discogenic back pain only: Secondary | ICD-10-CM | POA: Diagnosis not present

## 2023-12-12 ENCOUNTER — Telehealth (HOSPITAL_BASED_OUTPATIENT_CLINIC_OR_DEPARTMENT_OTHER): Payer: Self-pay

## 2023-12-12 ENCOUNTER — Telehealth: Payer: Self-pay | Admitting: Cardiology

## 2023-12-12 NOTE — Telephone Encounter (Signed)
Called patient to set up an Tele appt on 01/21 for pre-op, meds rec and consent done, pre Irving Burton APP ok to add on    Patient Consent for Virtual Visit        [name] [name] has provided verbal consent on 12/12/2023 for a virtual visit (video or telephone).   CONSENT FOR VIRTUAL VISIT FOR:  [name] [name]  By participating in this virtual visit I agree to the following:  I hereby voluntarily request, consent and authorize [facility] and its employed or contracted physicians, physician assistants, nurse practitioners or other licensed health care professionals (the Practitioner), to provide me with telemedicine health care services (the "Services") as deemed necessary by the treating Practitioner. I acknowledge and consent to receive the Services by the Practitioner via telemedicine. I understand that the telemedicine visit will involve communicating with the Practitioner through live audiovisual communication technology and the disclosure of certain medical information by electronic transmission. I acknowledge that I have been given the opportunity to request an in-person assessment or other available alternative prior to the telemedicine visit and am voluntarily participating in the telemedicine visit.  I understand that I have the right to withhold or withdraw my consent to the use of telemedicine in the course of my care at any time, without affecting my right to future care or treatment, and that the Practitioner or I may terminate the telemedicine visit at any time. I understand that I have the right to inspect all information obtained and/or recorded in the course of the telemedicine visit and may receive copies of available information for a reasonable fee.  I understand that some of the potential risks of receiving the Services via telemedicine include:  Delay or interruption in medical evaluation due to technological equipment failure or disruption; Information transmitted may not be  sufficient (e.g. poor resolution of images) to allow for appropriate medical decision making by the Practitioner; and/or  In rare instances, security protocols could fail, causing a breach of personal health information.  Furthermore, I acknowledge that it is my responsibility to provide information about my medical history, conditions and care that is complete and accurate to the best of my ability. I acknowledge that Practitioner's advice, recommendations, and/or decision may be based on factors not within their control, such as incomplete or inaccurate data provided by me or distortions of diagnostic images or specimens that may result from electronic transmissions. I understand that the practice of medicine is not an exact science and that Practitioner makes no warranties or guarantees regarding treatment outcomes. I acknowledge that a copy of this consent can be made available to me via my patient portal [facility] MyChart), or I can request a printed copy by calling the office of [facility].    I understand that my insurance will be billed for this visit.   I have read or had this consent read to me. I understand the contents of this consent, which adequately explains the benefits and risks of the Services being provided via telemedicine.  I have been provided ample opportunity to ask questions regarding this consent and the Services and have had my questions answered to my satisfaction. I give my informed consent for the services to be provided through the use of telemedicine in my medical care

## 2023-12-12 NOTE — Telephone Encounter (Signed)
Pt is returning nurse call and is requesting a callback to get tele pre op appt scheduled. Please advise

## 2023-12-12 NOTE — Telephone Encounter (Signed)
Called patient to set up an Tele appt on 01/21 for pre-op, meds rec and consent done, pre Irving Burton APP ok to add on.

## 2023-12-12 NOTE — Telephone Encounter (Signed)
   Name: [name] [name]  DOB: 11-Mar-1946  MRN: [id number]  Primary Cardiologist: Donato Schultz, MD   Preoperative team, please contact this patient and set up a phone call appointment for further preoperative risk assessment. Please obtain consent and complete medication review. Thank you for your help.  I confirm that guidance regarding antiplatelet and oral anticoagulation therapy has been completed and, if necessary, noted below.  Please advise patient to go ahead and hold aspirin 5 to 7 days prior to procedure in anticipation of procedure date of 10/21/2024  I also confirmed the patient resides in the state of [place]. As per [facility] Medical Board telemedicine laws, the patient must reside in the state in which the provider is licensed.   Napoleon Form, Leodis Rains, NP 12/12/2023, 12:43 PM [facility]

## 2023-12-12 NOTE — Telephone Encounter (Signed)
Mares, Roe Coombs   12/12/23  3:45 PM Note Called patient to set up an Tele appt on 01/21 for pre-op, meds rec and consent done, pre Irving Burton APP ok to add on.

## 2023-12-12 NOTE — Telephone Encounter (Signed)
   Pre-operative Risk Assessment    Patient Name: [name] [name]  DOB: March 15, 1946 MRN: [id number]   Date of last office visit: 10/14/2023 Dr. Donato Schultz Date of next office visit: None   Request for Surgical Clearance    Procedure:   Open RT inguinal hernia repair mesh  Date of Surgery:  Clearance 12/22/23                                Surgeon:  Dr. Melody Haver Surgeon's Group or Practice Name:  [facility] Phone number:  [phone] Fax number:  [phone]   Type of Clearance Requested:   - Medical  - Pharmacy:  Hold Aspirin     Type of Anesthesia:  General    Additional requests/questions:    SignedCathlean Cower Nickolas Chalfin   12/12/2023, 12:22 PM

## 2023-12-12 NOTE — Telephone Encounter (Signed)
Called patient to set up an tele appt for a per-op, no answer left him a vm to call back

## 2023-12-16 ENCOUNTER — Ambulatory Visit: Payer: PPO | Attending: Cardiology | Admitting: Emergency Medicine

## 2023-12-16 DIAGNOSIS — Z01810 Encounter for preprocedural cardiovascular examination: Secondary | ICD-10-CM

## 2023-12-16 NOTE — Progress Notes (Signed)
Virtual Visit via Telephone Note   Because of [name] [name]'s co-morbid illnesses, he is at least at moderate risk for complications without adequate follow up.  This format is felt to be most appropriate for this patient at this time.  The patient did not have access to video technology/had technical difficulties with video requiring transitioning to audio format only (telephone).  All issues noted in this document were discussed and addressed.  No physical exam could be performed with this format.  Please refer to the patient's chart for his consent to telehealth for [facility].  Evaluation Performed:  Preoperative cardiovascular risk assessment _____________   Date:  12/16/2023   Patient ID:  [name] [name], DOB 1946-03-16, MRN [id number] Patient Location:  Home Provider location:   Office  Primary Care Provider:  Linus Galas, NP Primary Cardiologist:  Donato Schultz, MD  Chief Complaint / Patient Profile   78 y.o. y/o male with a h/o coronary artery disease, hypertension, and mild tricuspid and mitral regurgitation who is pending open RT inguinal hernia repair mesh with [place] surgery by Dr. Hillery Hunter on 12/22/2023  and presents today for telephonic preoperative cardiovascular risk assessment.  History of Present Illness    [name] [name] is a 78 y.o. male who presents via audio/video conferencing for a telehealth visit today.  Pt was last seen in cardiology clinic on 10/14/2023 by Dr. Anne Fu.  At that time PASCUAL BUFKIN was doing well.  The patient is now pending procedure as outlined above. Since his last visit, he denies  exertional chest pain, shortness of breath, lower extremity edema, fatigue, palpitations, diaphoresis, weakness, presyncope, syncope, orthopnea, and PND. He denies any changes in his symptoms since he was last seen in office.   Past Medical History    Past Medical History:  Diagnosis Date   Arthritis    Asthma    uses inhaler daily and as needed    Blood transfusion 2012   surgery- esophageal bleeding secondary to hiatal hernia   CKD (chronic kidney disease)    Constipation    Coronary artery disease    Diverticulitis    Diverticulosis    Eczema    Esophageal stricture    GERD (gastroesophageal reflux disease)    HBP (high blood pressure)    on meds   Heart murmur    History of kidney stones    HTN (hypertension)    Hyperlipemia    on meds   Kidney stones    hx of   Pre-diabetes    Seasonal allergies    Trouble swallowing    Ulcer    Past Surgical History:  Procedure Laterality Date   COLONOSCOPY  2012   DB-F/V-mira(good)-tics   HIATAL HERNIA REPAIR  1992&2012   LAPAROSCOPIC NISSEN FUNDOPLICATION  1991, 09/24/11   WISDOM TOOTH EXTRACTION      Allergies  Allergies  Allergen Reactions   Percocet [Oxycodone-Acetaminophen] Nausea And Vomiting   Sulfacetamide Sodium Hives    Home Medications    Prior to Admission medications   Medication Sig Start Date End Date Taking? Authorizing Provider  acetaminophen (TYLENOL) 500 MG tablet Take 1,000 mg by mouth at bedtime as needed for moderate pain (pain score 4-6) or mild pain (pain score 1-3) (pain).    [provider]  albuterol (VENTOLIN HFA) 108 (90 Base) MCG/ACT inhaler INHALE 1-2 PUFFS BY MOUTH EVERY 6 HOURS AS NEEDED FOR WHEEZE OR SHORTNESS OF BREATH 12/09/23   Ambs, Norvel Richards, FNP  aspirin EC 81 MG tablet Take 1 tablet (81 mg total) by mouth daily. Swallow whole. Patient not taking: Reported on 12/12/2023 12/16/22   Jake Bathe, MD  Azelastine HCl 137 MCG/SPRAY SOLN USE 1 OR 2 SPRAYS INTO EACH NOSTRIL TWICE DAILY AS NEEDED FOR RUNNY NOSE/DRAINAGE. 07/01/22   Nehemiah Settle, FNP  diphenhydrAMINE (BENADRYL) 25 MG tablet Take 25 mg by mouth at bedtime as needed for allergies.    [provider]  fluticasone (FLONASE) 50 MCG/ACT nasal spray USE 2 SPRAYS INTO EACH NOSTRIL ONCE A DAY AS NEEDED FOR STUFFY NOSE 01/27/23   Nehemiah Settle, FNP  fluticasone  (FLOVENT HFA) 110 MCG/ACT inhaler INHALE 2 PUFFS INTO THE LUNGS IN THE MORNING AND AT BEDTIME. WITH SPACER AND RINSE MOUTH AFTERWARDS. 11/17/23   Ellamae Sia, DO  hydrochlorothiazide (MICROZIDE) 12.5 MG capsule Take 12.5 mg by mouth every other day. 08/30/23   [provider]  hydrOXYzine (ATARAX) 25 MG tablet Take 25 mg by mouth daily as needed for anxiety.    [provider]  isosorbide mononitrate (IMDUR) 30 MG 24 hr tablet TAKE 1 TABLET BY MOUTH EVERY DAY 11/24/23   Jake Bathe, MD  levocetirizine (XYZAL) 5 MG tablet TAKE 1 TABLET BY MOUTH ONCE DAILY AS NEEDED FOR RUNNY NOSE Patient taking differently: Take 5 mg by mouth every evening. 03/04/23   Hetty Blend, FNP  metoprolol succinate (TOPROL-XL) 25 MG 24 hr tablet TAKE 2 TABLETS (50 MG TOTAL) BY MOUTH DAILY WITH OR IMMEDIATELY FOLLOWING A MEAL 11/24/23   Jake Bathe, MD  montelukast (SINGULAIR) 10 MG tablet Take 1 tablet (10 mg total) by mouth at bedtime. 01/13/23   Hetty Blend, FNP  Multiple Vitamins-Minerals (MULTIVITAMIN WITH MINERALS) tablet Take 2 tablets by mouth daily. Life extention Patient not taking: Reported on 12/12/2023    [provider]  pantoprazole (PROTONIX) 40 MG tablet TAKE 1 TABLET BY MOUTH EVERY DAY Patient taking differently: Take 40 mg by mouth daily as needed (flare-up). 09/15/17   Hilarie Fredrickson, MD  promethazine-dextromethorphan (PROMETHAZINE-DM) 6.25-15 MG/5ML syrup Take 5 mLs by mouth 4 (four) times daily as needed. Patient not taking: Reported on 12/12/2023 11/08/23   Particia Nearing, PA-C  rosuvastatin (CRESTOR) 10 MG tablet TAKE 1 TABLET BY MOUTH EVERY DAY 11/24/23   Jake Bathe, MD  tamsulosin (FLOMAX) 0.4 MG CAPS capsule TAKE ONE CAPSULE BY MOUTH IN THE MORNING AND AT BEDTIME 11/29/22   McKenzie, Mardene Celeste, MD  triamcinolone cream (KENALOG) 0.1 % Apply 1 Application topically daily as needed (itching /dry skin). 02/23/20   [provider]  budesonide (PULMICORT  FLEXHALER) 180 MCG/ACT inhaler Inhale 2 puffs into the lungs 2 (two) times daily. 09/27/19 09/27/19  Bobbitt, Heywood Iles, MD    Physical Exam    Vital Signs:  Mariea Stable does not have vital signs available for review today.  Given telephonic nature of communication, physical exam is limited. AAOx3. NAD. Normal affect.  Speech and respirations are unlabored.  Accessory Clinical Findings    None  Assessment & Plan    1.  Preoperative Cardiovascular Risk Assessment: According to the Revised Cardiac Risk Index (RCRI), his Perioperative Risk of Major Cardiac Event is (%): 0.4. His Functional Capacity in METs is: 7.34 according to the Duke Activity Status Index (DASI). Therefore, based on ACC/AHA guidelines, patient would be at acceptable risk for the planned procedure without further cardiovascular testing.  The patient was advised that if he develops new  symptoms prior to surgery to contact our office to arrange for a follow-up visit, and he verbalized understanding.  He may hold Aspirin for 5-7 days prior to procedure. Please resume Aspirin as soon as possible postprocedure, at the discretion of the surgeon.    A copy of this note will be routed to requesting surgeon.  Time:   Today, I have spent 7 minutes with the patient with telehealth technology discussing medical history, symptoms, and management plan.     Denyce Robert, NP  12/16/2023, 11:26 AM

## 2023-12-17 ENCOUNTER — Encounter (HOSPITAL_COMMUNITY): Payer: Self-pay

## 2023-12-17 NOTE — Anesthesia Preprocedure Evaluation (Addendum)
Anesthesia Evaluation  Patient identified by MRN, date of birth, ID band Patient awake    Reviewed: Allergy & Precautions, NPO status , Patient's Chart, lab work & pertinent test results  History of Anesthesia Complications Negative for: history of anesthetic complications  Airway Mallampati: I  TM Distance: >3 FB Neck ROM: Full    Dental  (+) Dental Advisory Given   Pulmonary neg shortness of breath, asthma (used inhaler this morning) , neg sleep apnea, neg COPD, neg recent URI, former smoker Pulmonary nodule   Pulmonary exam normal breath sounds clear to auscultation       Cardiovascular hypertension (HCTZ, ISMN, metoprolol), Pt. on medications and Pt. on home beta blockers + angina  + CAD  (-) CABG (-) dysrhythmias + Valvular Problems/Murmurs  Rhythm:Regular Rate:Normal  HLD  Coronary CT 12/12/2022: IMPRESSION: 1. Severe CAD in the proximal-mid RCA and distal LCx, 70-99% stenosis, CADRADS 4. CT FFR will be performed and reported separately.   2. Coronary calcium score is 219, which places the patient in the 49th percentile for age and sex matched control.   3. Normal coronary origins with right dominance.  Works in the yard, walks    Neuro/Psych negative neurological ROS     GI/Hepatic Neg liver ROS, hiatal hernia (s/p repair),GERD (s/p Nissen fundoplication)  Medicated,,Diverticulosis, h/o esophageal ulcer   Endo/Other  Pre-diabetes  Renal/GU CRFRenal disease     Musculoskeletal  (+) Arthritis ,    Abdominal   Peds  Hematology negative hematology ROS (+) Lab Results      Component                Value               Date                      WBC                      5.2                 12/08/2023                HGB                      14.5                12/08/2023                HCT                      44.3                12/08/2023                MCV                      94.7                 12/08/2023                PLT                      184                 12/08/2023              Anesthesia Other Findings   Reproductive/Obstetrics  Anesthesia Physical Anesthesia Plan  ASA: 3  Anesthesia Plan: General   Post-op Pain Management: Regional block* and Tylenol PO (pre-op)*   Induction: Intravenous  PONV Risk Score and Plan: 2 and Ondansetron, Dexamethasone and Treatment may vary due to age or medical condition  Airway Management Planned: LMA  Additional Equipment:   Intra-op Plan:   Post-operative Plan: Extubation in OR  Informed Consent: I have reviewed the patients History and Physical, chart, labs and discussed the procedure including the risks, benefits and alternatives for the proposed anesthesia with the patient or authorized representative who has indicated his/her understanding and acceptance.     Dental advisory given  Plan Discussed with: Anesthesiologist  Anesthesia Plan Comments: (See PAT note from 1/13 by Sherlie Ban PA-C  Discussed potential risks of nerve blocks including, but not limited to, infection, bleeding, nerve damage, seizures, pneumothorax, respiratory depression, and potential failure of the block. Alternatives to nerve blocks discussed. All questions answered.  Risks of general anesthesia discussed including, but not limited to, sore throat, hoarse voice, chipped/damaged teeth, injury to vocal cords, nausea and vomiting, allergic reactions, lung infection, heart attack, stroke, and death. All questions answered.  )        Anesthesia Quick Evaluation

## 2023-12-17 NOTE — Progress Notes (Signed)
Case: [id number] Date/Time: 12/22/23 1145   Procedure: OPEN RIGHT INGUINAL HERNIA REPAIR WITH MESH (Right) - CHOICE TAP BLOCK   Anesthesia type: Choice   Pre-op diagnosis: Right Inguinal Hernia   Location: [facility] ROOM 05 / [facility]   Surgeons: Moise Boring, MD       DISCUSSION: [name] [name] is a 78 year old male who presents to PAT prior to surgery above.  Past medical history significant for former smoking, hypertension, CAD, heart murmur (mild tricuspid and mitral regurgitation), GERD, esophageal stricture, hiatal hernia status post repair, CKD, prediabetes, arthritis.  Patient has CAD diagnosed by CT scan.  He had an elevated calcium score which led to a CTA of his coronaries which was done on 12/12/2022.  This showed severe CAD in the proximal to mid RCA and distal LCx.  CT FFR was done which was positive in the LCx region. Decision was made to not undergo cardiac cath since patient was asymptomatic and medical therapy was recommended.  Cardiac clearance requested and done on 12/16/2023.  Patient was cleared:  "Preoperative Cardiovascular Risk Assessment: According to the Revised Cardiac Risk Index (RCRI), his Perioperative Risk of Major Cardiac Event is (%): 0.4. His Functional Capacity in METs is: 7.34 according to the Duke Activity Status Index (DASI). Therefore, based on ACC/AHA guidelines, patient would be at acceptable risk for the planned procedure without further cardiovascular testing.   The patient was advised that if he develops new symptoms prior to surgery to contact our office to arrange for a follow-up visit, and he verbalized understanding.   He may hold Aspirin for 5-7 days prior to procedure. Please resume Aspirin as soon as possible postprocedure, at the discretion of the surgeon."  Patient with history of asthma.  No recent exacerbations.  He currently uses inhalers.   VS: BP 108/64 Comment: right arm sitting  Pulse 62   Temp 36.6 C (Oral)   Resp 14   Ht 5\' 11"   (1.803 m)   Wt 83 kg   SpO2 100%   BMI 25.52 kg/m   PROVIDERS: Linus Galas, NP Cardiologist:  Donato Schultz, MD   LABS: Labs reviewed: Acceptable for surgery. (all labs ordered are listed, but only abnormal results are displayed)  Labs Reviewed  BASIC METABOLIC PANEL - Abnormal; Notable for the following components:      Result Value   Glucose, Bld 110 (*)    Calcium 8.8 (*)    All other components within normal limits  CBC     IMAGES:  CT abdomen/pelvis 09/19/2023:  IMPRESSION: 1. No acute noncontrast CT findings of the abdomen or pelvis to explain right lower quadrant pain. 2. Status post fundoplication. 3. Multiple small bilateral nonobstructive renal calculi. No ureteral calculi or hydronephrosis. 4. Prostatomegaly with median lobe hypertrophy. 5. Evidence of prior right inguinal hernia repair. 6. Normal appendix.   Aortic Atherosclerosis (ICD10-I70.0).  CT chest 12/12/2022:  IMPRESSION: 1. Small type 1 hiatal hernia. 2. Airway thickening is present, suggesting bronchitis or reactive airways disease. 3. Small hypodense lesion in the dome of segment 7 of the liver, also present on 02/03/2017, technically nonspecific although statistically highly likely to be benign and incidental based on long-term stability.     EKG 10/14/2023:  Normal sinus rhythm, rate 61  CV:  CTA coronaries 12/12/2022:  IMPRESSION: 1. Severe CAD in the proximal-mid RCA and distal LCx, 70-99% stenosis, CADRADS 4. CT FFR will be performed and reported separately.   2. Coronary calcium score is 219, which places the patient  in the 49th percentile for age and sex matched control.   3. Normal coronary origins with right dominance.   RECOMMENDATIONS: CAD-RADS 4 Severe stenosis. (70-99% or > 50% left main). Cardiac catheterization or CT FFR is recommended. Consider symptom-guided anti-ischemic pharmacotherapy as well as risk factor modification per guideline directed care.      Echo 08/15/2022:  Conclusion: Left ventricular cavity is normal in size. Mild to moderate wall thickness Diastolic filling with impaired relaxation pattern Normal global wall motion No left ventricular regional wall motion abnormalities are seen Calculated EF 55% Left atrium size is normal Right ventricle size is normal Aortic valve structurally normal Trace mitral regurgitation Trace to mild tricuspid regurgitation IVC is normal with respiratory variation of greater than 50% collapse No evidence of pulmonary hypertension  Past Medical History:  Diagnosis Date   Arthritis    Asthma    uses inhaler daily and as needed   Blood transfusion 2012   surgery- esophageal bleeding secondary to hiatal hernia   CKD (chronic kidney disease)    Constipation    Coronary artery disease    Diverticulitis    Diverticulosis    Eczema    Esophageal stricture    GERD (gastroesophageal reflux disease)    HBP (high blood pressure)    on meds   Heart murmur    History of kidney stones    HTN (hypertension)    Hyperlipemia    on meds   Kidney stones    hx of   Pre-diabetes    Seasonal allergies    Trouble swallowing    Ulcer     Past Surgical History:  Procedure Laterality Date   COLONOSCOPY  2012   DB-F/V-mira(good)-tics   HIATAL HERNIA REPAIR  1992&2012   LAPAROSCOPIC NISSEN FUNDOPLICATION  1991, 09/24/11   WISDOM TOOTH EXTRACTION      MEDICATIONS:  acetaminophen (TYLENOL) 500 MG tablet   albuterol (VENTOLIN HFA) 108 (90 Base) MCG/ACT inhaler   aspirin EC 81 MG tablet   Azelastine HCl 137 MCG/SPRAY SOLN   diphenhydrAMINE (BENADRYL) 25 MG tablet   fluticasone (FLONASE) 50 MCG/ACT nasal spray   fluticasone (FLOVENT HFA) 110 MCG/ACT inhaler   hydrochlorothiazide (MICROZIDE) 12.5 MG capsule   hydrOXYzine (ATARAX) 25 MG tablet   isosorbide mononitrate (IMDUR) 30 MG 24 hr tablet   levocetirizine (XYZAL) 5 MG tablet   metoprolol succinate (TOPROL-XL) 25 MG 24 hr tablet    montelukast (SINGULAIR) 10 MG tablet   Multiple Vitamins-Minerals (MULTIVITAMIN WITH MINERALS) tablet   pantoprazole (PROTONIX) 40 MG tablet   promethazine-dextromethorphan (PROMETHAZINE-DM) 6.25-15 MG/5ML syrup   rosuvastatin (CRESTOR) 10 MG tablet   tamsulosin (FLOMAX) 0.4 MG CAPS capsule   triamcinolone cream (KENALOG) 0.1 %   No current facility-administered medications for this encounter.   Marcille Blanco [facility] Short Stay/Anesthesiology [facility] Phone [phone] 12/17/2023 10:12 AM

## 2023-12-22 ENCOUNTER — Ambulatory Visit (HOSPITAL_COMMUNITY): Payer: Self-pay | Admitting: Certified Registered"

## 2023-12-22 ENCOUNTER — Ambulatory Visit (HOSPITAL_COMMUNITY): Payer: PPO | Admitting: Physician Assistant

## 2023-12-22 ENCOUNTER — Ambulatory Visit (HOSPITAL_COMMUNITY)
Admission: RE | Admit: 2023-12-22 | Discharge: 2023-12-22 | Disposition: A | Discharge status: Home / Self Care | Payer: PPO | Source: Ambulatory Visit | Attending: General Surgery | Admitting: General Surgery

## 2023-12-22 ENCOUNTER — Other Ambulatory Visit: Payer: Self-pay

## 2023-12-22 ENCOUNTER — Encounter (HOSPITAL_COMMUNITY): Payer: Self-pay | Admitting: General Surgery

## 2023-12-22 ENCOUNTER — Encounter (HOSPITAL_COMMUNITY)
Admission: RE | Disposition: A | Discharge status: Home / Self Care | Payer: Self-pay | Source: Ambulatory Visit | Attending: General Surgery

## 2023-12-22 DIAGNOSIS — G8918 Other acute postprocedural pain: Secondary | ICD-10-CM | POA: Diagnosis not present

## 2023-12-22 DIAGNOSIS — I25119 Atherosclerotic heart disease of native coronary artery with unspecified angina pectoris: Secondary | ICD-10-CM | POA: Insufficient documentation

## 2023-12-22 DIAGNOSIS — I129 Hypertensive chronic kidney disease with stage 1 through stage 4 chronic kidney disease, or unspecified chronic kidney disease: Secondary | ICD-10-CM | POA: Diagnosis not present

## 2023-12-22 DIAGNOSIS — N182 Chronic kidney disease, stage 2 (mild): Secondary | ICD-10-CM

## 2023-12-22 DIAGNOSIS — I2510 Atherosclerotic heart disease of native coronary artery without angina pectoris: Secondary | ICD-10-CM

## 2023-12-22 DIAGNOSIS — I10 Essential (primary) hypertension: Secondary | ICD-10-CM | POA: Diagnosis not present

## 2023-12-22 DIAGNOSIS — Z87891 Personal history of nicotine dependence: Secondary | ICD-10-CM | POA: Diagnosis not present

## 2023-12-22 DIAGNOSIS — J45909 Unspecified asthma, uncomplicated: Secondary | ICD-10-CM | POA: Diagnosis not present

## 2023-12-22 DIAGNOSIS — K4090 Unilateral inguinal hernia, without obstruction or gangrene, not specified as recurrent: Secondary | ICD-10-CM

## 2023-12-22 HISTORY — PX: INGUINAL HERNIA REPAIR: SHX194

## 2023-12-22 SURGERY — REPAIR, HERNIA, INGUINAL, ADULT
Anesthesia: General | Laterality: Right

## 2023-12-22 MED ORDER — LIDOCAINE 2% (20 MG/ML) 5 ML SYRINGE
INTRAMUSCULAR | Status: DC | PRN
  Administered 2023-12-22: 100 mg via INTRAVENOUS

## 2023-12-22 MED ORDER — ORAL CARE MOUTH RINSE: 15.0000 mL | Freq: Once | OROMUCOSAL | Status: AC

## 2023-12-22 MED ORDER — PHENYLEPHRINE 80 MCG/ML (10ML) SYRINGE FOR IV PUSH (FOR BLOOD PRESSURE SUPPORT)
PREFILLED_SYRINGE | INTRAVENOUS | Status: DC | PRN
  Administered 2023-12-22: 40 ug via INTRAVENOUS

## 2023-12-22 MED ORDER — DEXAMETHASONE SODIUM PHOSPHATE 10 MG/ML IJ SOLN
INTRAMUSCULAR | Status: DC | PRN
  Administered 2023-12-22: 4 mg via INTRAVENOUS

## 2023-12-22 MED ORDER — AMISULPRIDE (ANTIEMETIC) 5 MG/2ML IV SOLN: 10.0000 mg | Freq: Once | INTRAVENOUS | Status: DC | PRN

## 2023-12-22 MED ORDER — SUGAMMADEX SODIUM 200 MG/2ML IV SOLN
INTRAVENOUS | Status: DC | PRN
  Administered 2023-12-22: 180 mg via INTRAVENOUS

## 2023-12-22 MED ORDER — BUPIVACAINE-EPINEPHRINE 0.25% -1:200000 IJ SOLN
INTRAMUSCULAR | Status: DC | PRN
  Administered 2023-12-22: 20 mL

## 2023-12-22 MED ORDER — EPHEDRINE SULFATE-NACL 50-0.9 MG/10ML-% IV SOSY
PREFILLED_SYRINGE | INTRAVENOUS | Status: DC | PRN
  Administered 2023-12-22 (×3): 2.5 mg via INTRAVENOUS

## 2023-12-22 MED ORDER — ACETAMINOPHEN 650 MG RE SUPP: 650.0000 mg | RECTAL | Status: DC | PRN

## 2023-12-22 MED ORDER — PHENYLEPHRINE HCL-NACL 20-0.9 MG/250ML-% IV SOLN
INTRAVENOUS | Status: DC | PRN
  Administered 2023-12-22: 40 ug/min via INTRAVENOUS

## 2023-12-22 MED ORDER — LACTATED RINGERS IV SOLN: INTRAVENOUS | Status: DC

## 2023-12-22 MED ORDER — ROCURONIUM BROMIDE 10 MG/ML (PF) SYRINGE
PREFILLED_SYRINGE | INTRAVENOUS | Status: AC
  Filled 2023-12-22: qty 10

## 2023-12-22 MED ORDER — CEFAZOLIN SODIUM-DEXTROSE 2-4 GM/100ML-% IV SOLN
2.0000 g | INTRAVENOUS | Status: AC
  Administered 2023-12-22: 2 g via INTRAVENOUS
  Filled 2023-12-22: qty 100

## 2023-12-22 MED ORDER — PROPOFOL 10 MG/ML IV BOLUS
INTRAVENOUS | Status: DC | PRN
  Administered 2023-12-22: 150 mg via INTRAVENOUS

## 2023-12-22 MED ORDER — SODIUM CHLORIDE 0.9 % IV SOLN: 250.0000 mL | INTRAVENOUS | Status: DC | PRN

## 2023-12-22 MED ORDER — PHENYLEPHRINE HCL (PRESSORS) 10 MG/ML IV SOLN
INTRAVENOUS | Status: AC
  Filled 2023-12-22: qty 1

## 2023-12-22 MED ORDER — FENTANYL CITRATE (PF) 100 MCG/2ML IJ SOLN
INTRAMUSCULAR | Status: AC
  Filled 2023-12-22: qty 2

## 2023-12-22 MED ORDER — BUPIVACAINE-EPINEPHRINE 0.25% -1:200000 IJ SOLN
INTRAMUSCULAR | Status: AC
  Filled 2023-12-22: qty 1

## 2023-12-22 MED ORDER — ONDANSETRON HCL 4 MG/2ML IJ SOLN
INTRAMUSCULAR | Status: AC
  Filled 2023-12-22: qty 2

## 2023-12-22 MED ORDER — SODIUM CHLORIDE 0.9% FLUSH: 3.0000 mL | INTRAVENOUS | Status: DC | PRN

## 2023-12-22 MED ORDER — CHLORHEXIDINE GLUCONATE 0.12 % MT SOLN
15.0000 mL | Freq: Once | OROMUCOSAL | Status: AC
  Administered 2023-12-22: 15 mL via OROMUCOSAL

## 2023-12-22 MED ORDER — ACETAMINOPHEN 325 MG PO TABS: 650.0000 mg | ORAL_TABLET | Freq: Four times a day (QID) | ORAL | 0 refills | Status: AC

## 2023-12-22 MED ORDER — CHLORHEXIDINE GLUCONATE CLOTH 2 % EX PADS
6.0000 | MEDICATED_PAD | Freq: Once | CUTANEOUS | Status: DC
Start: 2023-12-22 — End: 2023-12-22

## 2023-12-22 MED ORDER — SODIUM CHLORIDE 0.9% FLUSH: 3.0000 mL | Freq: Two times a day (BID) | INTRAVENOUS | Status: DC

## 2023-12-22 MED ORDER — BUPIVACAINE HCL (PF) 0.25 % IJ SOLN
INTRAMUSCULAR | Status: DC | PRN
Start: 2023-12-22 — End: 2023-12-22
  Administered 2023-12-22: 30 mL via PERINEURAL

## 2023-12-22 MED ORDER — MIDAZOLAM HCL 2 MG/2ML IJ SOLN
1.0000 mg | INTRAMUSCULAR | Status: DC | PRN
  Filled 2023-12-22: qty 2

## 2023-12-22 MED ORDER — FENTANYL CITRATE (PF) 100 MCG/2ML IJ SOLN
INTRAMUSCULAR | Status: DC | PRN
  Administered 2023-12-22 (×2): 50 ug via INTRAVENOUS

## 2023-12-22 MED ORDER — DEXAMETHASONE SODIUM PHOSPHATE 10 MG/ML IJ SOLN
INTRAMUSCULAR | Status: AC
  Filled 2023-12-22: qty 1

## 2023-12-22 MED ORDER — LIDOCAINE HCL (PF) 2 % IJ SOLN
INTRAMUSCULAR | Status: AC
  Filled 2023-12-22: qty 5

## 2023-12-22 MED ORDER — ACETAMINOPHEN 325 MG PO TABS: 650.0000 mg | ORAL_TABLET | ORAL | Status: DC | PRN

## 2023-12-22 MED ORDER — CEFAZOLIN SODIUM-DEXTROSE 2-3 GM-%(50ML) IV SOLR: INTRAVENOUS | Status: DC | PRN

## 2023-12-22 MED ORDER — BUPIVACAINE LIPOSOME 1.3 % IJ SUSP
INTRAMUSCULAR | Status: AC
  Filled 2023-12-22: qty 20

## 2023-12-22 MED ORDER — ROCURONIUM BROMIDE 10 MG/ML (PF) SYRINGE
PREFILLED_SYRINGE | INTRAVENOUS | Status: DC | PRN
  Administered 2023-12-22: 20 mg via INTRAVENOUS
  Administered 2023-12-22: 50 mg via INTRAVENOUS
  Administered 2023-12-22: 5 mg via INTRAVENOUS
  Administered 2023-12-22: 10 mg via INTRAVENOUS

## 2023-12-22 MED ORDER — ACETAMINOPHEN 500 MG PO TABS
1000.0000 mg | ORAL_TABLET | ORAL | Status: AC
  Administered 2023-12-22: 1000 mg via ORAL
  Filled 2023-12-22: qty 2

## 2023-12-22 MED ORDER — MORPHINE SULFATE (PF) 2 MG/ML IV SOLN
2.0000 mg | INTRAVENOUS | Status: DC | PRN
Start: 2023-12-22 — End: 2023-12-22

## 2023-12-22 MED ORDER — ONDANSETRON HCL 4 MG/2ML IJ SOLN
INTRAMUSCULAR | Status: DC | PRN
  Administered 2023-12-22: 4 mg via INTRAVENOUS

## 2023-12-22 MED ORDER — OXYCODONE HCL 5 MG PO TABS: 5.0000 mg | ORAL_TABLET | ORAL | Status: DC | PRN

## 2023-12-22 MED ORDER — OXYCODONE HCL 5 MG PO TABS: 5.0000 mg | ORAL_TABLET | Freq: Three times a day (TID) | ORAL | 0 refills | Status: AC | PRN

## 2023-12-22 MED ORDER — FENTANYL CITRATE PF 50 MCG/ML IJ SOSY
50.0000 ug | PREFILLED_SYRINGE | INTRAMUSCULAR | Status: DC | PRN
  Administered 2023-12-22: 50 ug via INTRAVENOUS
  Filled 2023-12-22: qty 2

## 2023-12-22 MED ORDER — IBUPROFEN 200 MG PO TABS: 600.0000 mg | ORAL_TABLET | Freq: Four times a day (QID) | ORAL | 0 refills | Status: AC

## 2023-12-22 MED ORDER — PROPOFOL 10 MG/ML IV BOLUS
INTRAVENOUS | Status: AC
  Filled 2023-12-22: qty 20

## 2023-12-22 MED ORDER — FENTANYL CITRATE PF 50 MCG/ML IJ SOSY: 25.0000 ug | PREFILLED_SYRINGE | INTRAMUSCULAR | Status: DC | PRN

## 2023-12-22 SURGICAL SUPPLY — 35 items
BAG COUNTER SPONGE SURGICOUNT (BAG) IMPLANT
BLADE SURG 15 STRL LF DISP TIS (BLADE) ×1 IMPLANT
BLADE SURG SZ10 CARB STEEL (BLADE) IMPLANT
CHLORAPREP W/TINT 26 (MISCELLANEOUS) ×1 IMPLANT
COVER SURGICAL LIGHT HANDLE (MISCELLANEOUS) ×1 IMPLANT
DERMABOND ADVANCED .7 DNX12 (GAUZE/BANDAGES/DRESSINGS) ×1 IMPLANT
DISSECTOR ROUND CHERRY 3/8 STR (MISCELLANEOUS) IMPLANT
DRAIN PENROSE 0.5X18 (DRAIN) IMPLANT
DRAPE LAPAROTOMY T 102X78X121 (DRAPES) ×1 IMPLANT
DRAPE LAPAROTOMY TRNSV 102X78 (DRAPES) ×1 IMPLANT
DRAPE UTILITY XL STRL (DRAPES) ×1 IMPLANT
ELECT PENCIL ROCKER SW 15FT (MISCELLANEOUS) ×1 IMPLANT
ELECT REM PT RETURN 15FT ADLT (MISCELLANEOUS) ×1 IMPLANT
GAUZE 4X4 16PLY ~~LOC~~+RFID DBL (SPONGE) ×1 IMPLANT
GLOVE BIO SURGEON STRL SZ7 (GLOVE) ×1 IMPLANT
GLOVE INDICATOR 7.5 STRL GRN (GLOVE) ×1 IMPLANT
GOWN STRL REUS W/ TWL XL LVL3 (GOWN DISPOSABLE) ×1 IMPLANT
KIT BASIN OR (CUSTOM PROCEDURE TRAY) ×1 IMPLANT
KIT TURNOVER KIT A (KITS) IMPLANT
MARKER SKIN DUAL TIP RULER LAB (MISCELLANEOUS) ×1 IMPLANT
MESH BARD SOFT 3X6IN (Mesh General) IMPLANT
NDL HYPO 25X1 1.5 SAFETY (NEEDLE) ×1 IMPLANT
NEEDLE HYPO 25X1 1.5 SAFETY (NEEDLE) ×1
PACK BASIC VI WITH GOWN DISP (CUSTOM PROCEDURE TRAY) ×1 IMPLANT
SPIKE FLUID TRANSFER (MISCELLANEOUS) ×1 IMPLANT
SUT MNCRL AB 4-0 PS2 18 (SUTURE) ×1 IMPLANT
SUT PDS AB 2-0 CT2 27 (SUTURE) ×2 IMPLANT
SUT PROLENE 2 0 SH DA (SUTURE) ×2 IMPLANT
SUT SILK 3-0 18XBRD TIE 12 (SUTURE) IMPLANT
SUT VIC AB 3-0 SH 18 (SUTURE) ×1 IMPLANT
SUT VIC AB 3-0 SH 27XBRD (SUTURE) ×2 IMPLANT
SYR 20ML LL LF (SYRINGE) ×1 IMPLANT
SYR BULB IRRIG 60ML STRL (SYRINGE) IMPLANT
TOWEL OR 17X26 10 PK STRL BLUE (TOWEL DISPOSABLE) ×1 IMPLANT
YANKAUER SUCT BULB TIP 10FT TU (MISCELLANEOUS) ×1 IMPLANT

## 2023-12-22 NOTE — Transfer of Care (Signed)
Immediate Anesthesia Transfer of Care Note  Patient: [name] [name]  Procedure(s) Performed: Procedure(s) with comments: OPEN RIGHT INGUINAL HERNIA REPAIR WITH MESH (Right) - CHOICE TAP BLOCK  Patient Location: PACU  Anesthesia Type:General  Level of Consciousness:  sedated, patient cooperative and responds to stimulation  Airway & Oxygen Therapy:Patient Spontanous Breathing and Patient connected to face mask oxgen  Post-op Assessment:  Report given to PACU RN and Post -op Vital signs reviewed and [name]  Post vital signs:  Reviewed and [name]  Last Vitals:  Vitals:   12/22/23 1220 12/22/23 1441  BP:    Pulse: (!) 54   Resp: 18   Temp:  36.6 C  SpO2: 99%     Complications: No apparent anesthesia complications

## 2023-12-22 NOTE — Op Note (Signed)
Post-Op Note/Post-Procedure Note  Patient: [name] [name] MRN: [id number] DOB: April 30, 1946 Sex: male Operation/Procedure Date: 12/22/2023 Surgeons and Role:    * Ernesto Zukowski, Lucilla Edin, MD - Primary  Pre-operative Diagnoses: Right Inguinal Hernia Postoperative Diagnoses: Same  Procedure performed: Procedures:   * OPEN RIGHT INGUINAL HERNIA REPAIR WITH MESH Side: Right Anesthesia: General  Indications: [name] [name] is a 78 y.o. year old male who presents for open right inguinal hernia repair. Preoperatively, I discussed in detail the risks, benefits, alternatives, and potential complications. The patient understands and requests to proceed.  Operative Findings: Indirect right inguinal hernia  Technique: The patient was positively identified, was taken to the operating room and placed supine on the operating table. The patient had voided prior to the procedure. The arms were carefully left out and padded. After establishment of deep sedation by the anesthesia team, the lower abdomen was prepped and draped in the usual sterile surgical fashion. A time-out was performed confirming correct patient and procedure. We identified the right anterosuperior iliac spine and the right pubic tubercle. A field block was performed using a combination of 0.25% Marcaine plain. We then made a 7-cm oblique incision just above the right pubic tubercle. This was done with #15 blade and the subcutaneous tissues were divided. The expected vein in the subcutaneous space laterally was controlled using the Bovie. We then incised the Scarpa's fascia and soon encountered the external oblique aponeurosis. We identified the femoral vessels and confirmed no femoral hernia. A #15 blade was used to incise the external oblique aponeurosis and this was divided obliterating the external ring. The division of the external oblique was taken up cephalad for an additional 3 cm. Care was taken not to injure the ilioinguinal nerve and  iliohypogastric nerve, which were identified and kept away from our dissection. We then encircled the cord structures, and controlled these with a Penrose drain. We then explored the cord by dividing the cremaster muscles. We identified the testicular vessels and the vas. There was evidence of an indirect hernia. The sac was isolated and carefully separated from the important structures.  The sac was opened and explored. The then performed a high ligation using 2-0 silk.  Hemostasis was observed. We then turned our attention to exploration of the floor. He had no obvious femoral hernia palpated. We brought Bard Softmesh 15cm x 7cm and placed this as an onlay in a modified Lichtenstein fashion using running 2-0 Prolene suture inferiorly along the shelving edge of the inguinal ligament. Superiorly, we place interrupted 2-0 PDS suture fixating the mesh to the conjoint tendon taking care not to injure or entrap nervous structures. We made a new internal ring with 3-0 Vicryl suture and made sure this was not too tight around the cord structures. We irrigated the dissection field with warm sterile saline. The entire mesh and tails were tucked up under the external oblique aponeurosis. We closed the external oblique aponeurosis with running 3-0 Vicryl suture. In doing this, we created a new external ring. The subcutaneous tissues were infiltrated with the local mixture. Scarpa's layer was closed using running 3-0 Vicryl suture. In addition, we closed the deep dermal layers using interrupted 3-0 Vicryl suture. A 4-0 Monocryl running subcuticular suture was used to close the skin. Final dressing of Dermabond was placed.  All sponge, needle, and instrument counts were reported correct at the end of the case. We confirmed that the testicles were in their proper position in the scrotal sac at the end of the  case.   Estimated Blood Loss: Minimal. Specimens: Hernia sac Implants:  Implant Name Type Inv. Item Serial No.  Manufacturer Lot No. LRB No. Used Action  MESH BARD SOFT 3X6IN - [id number] Mesh General MESH BARD SOFT Geni Bers BARD ACCESS [id number] Right 1 Implanted   Drains: None. Complications: * No complications entered in OR log * Condition of the patient: Good, emerged from sedation Disposition: PACU  Moise Boring Date: 12/22/2023 Time: 2:33 PM

## 2023-12-22 NOTE — H&P (Signed)
[name] [name] [birth year]  [id number].    HPI:  78 y/o M w/ a hx of prior ex-lap who presents for elective repair of a right inguinal hernia. He is in his usual state of health and denies any recent changes in medication.   ROS: Review of Systems  Constitutional: Negative.   HENT: Negative.    Eyes: Negative.   Respiratory: Negative.    Cardiovascular: Negative.   Gastrointestinal: Negative.   Genitourinary: Negative.   Musculoskeletal: Negative.   Skin: Negative.   Neurological: Negative.   Endo/Heme/Allergies: Negative.   Psychiatric/Behavioral: Negative.      Family History  Problem Relation Age of Onset   Heart disease Father 58       heart attack   Colon cancer Neg Hx    Allergic rhinitis Neg Hx    Angioedema Neg Hx    Asthma Neg Hx    Eczema Neg Hx    Immunodeficiency Neg Hx    Urticaria Neg Hx    Colon polyps Neg Hx    Esophageal cancer Neg Hx    Rectal cancer Neg Hx    Stomach cancer Neg Hx     Past Medical History:  Diagnosis Date   Arthritis    Asthma    uses inhaler daily and as needed   Blood transfusion 2012   surgery- esophageal bleeding secondary to hiatal hernia   CKD (chronic kidney disease)    Constipation    Coronary artery disease    Diverticulitis    Diverticulosis    Eczema    Esophageal stricture    GERD (gastroesophageal reflux disease)    HBP (high blood pressure)    on meds   Heart murmur    History of kidney stones    HTN (hypertension)    Hyperlipemia    on meds   Kidney stones    hx of   Pre-diabetes    Seasonal allergies    Trouble swallowing    Ulcer     Past Surgical History:  Procedure Laterality Date   COLONOSCOPY  2012   DB-F/V-mira(good)-tics   HIATAL HERNIA REPAIR  1992&2012   LAPAROSCOPIC NISSEN FUNDOPLICATION  1991, 09/24/11   WISDOM TOOTH EXTRACTION      Social History:  reports that he quit smoking about 42 years ago. His smoking use included cigarettes. He has never been exposed to tobacco  smoke. He has never used smokeless tobacco. He reports that he does not currently use alcohol. He reports that he does not use drugs.  Allergies:  Allergies  Allergen Reactions   Percocet [Oxycodone-Acetaminophen] Nausea And Vomiting   Sulfacetamide Sodium Hives    Medications Prior to Admission  Medication Sig Dispense Refill   albuterol (VENTOLIN HFA) 108 (90 Base) MCG/ACT inhaler INHALE 1-2 PUFFS BY MOUTH EVERY 6 HOURS AS NEEDED FOR WHEEZE OR SHORTNESS OF BREATH 18 each 1   Azelastine HCl 137 MCG/SPRAY SOLN USE 1 OR 2 SPRAYS INTO EACH NOSTRIL TWICE DAILY AS NEEDED FOR RUNNY NOSE/DRAINAGE. 30 mL 5   fluticasone (FLONASE) 50 MCG/ACT nasal spray USE 2 SPRAYS INTO EACH NOSTRIL ONCE A DAY AS NEEDED FOR STUFFY NOSE 48 mL 3   hydrochlorothiazide (MICROZIDE) 12.5 MG capsule Take 12.5 mg by mouth every other day.     hydrOXYzine (ATARAX) 25 MG tablet Take 25 mg by mouth daily as needed for anxiety.     isosorbide mononitrate (IMDUR) 30 MG 24 hr tablet TAKE 1 TABLET BY MOUTH EVERY DAY  90 tablet 3   levocetirizine (XYZAL) 5 MG tablet TAKE 1 TABLET BY MOUTH ONCE DAILY AS NEEDED FOR RUNNY NOSE (Patient taking differently: Take 5 mg by mouth every evening.) 90 tablet 1   metoprolol succinate (TOPROL-XL) 25 MG 24 hr tablet TAKE 2 TABLETS (50 MG TOTAL) BY MOUTH DAILY WITH OR IMMEDIATELY FOLLOWING A MEAL 180 tablet 3   montelukast (SINGULAIR) 10 MG tablet Take 1 tablet (10 mg total) by mouth at bedtime. 30 tablet 5   Multiple Vitamins-Minerals (MULTIVITAMIN WITH MINERALS) tablet Take 2 tablets by mouth daily. Life extention     pantoprazole (PROTONIX) 40 MG tablet TAKE 1 TABLET BY MOUTH EVERY DAY (Patient taking differently: Take 40 mg by mouth daily as needed (flare-up).) 30 tablet 6   promethazine-dextromethorphan (PROMETHAZINE-DM) 6.25-15 MG/5ML syrup Take 5 mLs by mouth 4 (four) times daily as needed. 100 mL 0   rosuvastatin (CRESTOR) 10 MG tablet TAKE 1 TABLET BY MOUTH EVERY DAY 90 tablet 3    tamsulosin (FLOMAX) 0.4 MG CAPS capsule TAKE ONE CAPSULE BY MOUTH IN THE MORNING AND AT BEDTIME 180 capsule 3   triamcinolone cream (KENALOG) 0.1 % Apply 1 Application topically daily as needed (itching /dry skin).     acetaminophen (TYLENOL) 500 MG tablet Take 1,000 mg by mouth at bedtime as needed for moderate pain (pain score 4-6) or mild pain (pain score 1-3) (pain).     aspirin EC 81 MG tablet Take 1 tablet (81 mg total) by mouth daily. Swallow whole. (Patient not taking: Reported on 12/12/2023) 90 tablet 3   diphenhydrAMINE (BENADRYL) 25 MG tablet Take 25 mg by mouth at bedtime as needed for allergies.     fluticasone (FLOVENT HFA) 110 MCG/ACT inhaler INHALE 2 PUFFS INTO THE LUNGS IN THE MORNING AND AT BEDTIME. WITH SPACER AND RINSE MOUTH AFTERWARDS. 12 each 0    Physical Exam: Blood pressure 138/77, pulse (!) 55, temperature 97.8 F (36.6 C), temperature source Oral, resp. rate 15, height 5\' 11"  (1.803 m), weight 83 kg, SpO2 98%. Gen: male, NAD Groin: right-sided bulge, area marked in presence of patient  No results found for this or any previous visit (from the past 48 hours). No results found.  Assessment/Plan 78 y/o M who presents for elective right inguinal hernia repair  - Will proceed to the OR. We discussed the alternatives and potential risks of surgery, including but not limited to: bleeding, infection, damage to bowel or surrounding structures, mesh complications, chronic pain, recurrent hernia, and need for additional procedures. All questions were addressed and consent was obtained.    [facility] 12/22/2023, 11:42 AM Please see Amion for pager number during day hours 7:00am-4:30pm or 7:00am -11:30am on weekends

## 2023-12-22 NOTE — Discharge Instructions (Signed)

## 2023-12-22 NOTE — Anesthesia Procedure Notes (Signed)
Procedure Name: Intubation Date/Time: 12/22/2023 12:30 PM  Performed by: Sindy Guadeloupe, CRNAPre-anesthesia Checklist: Patient identified, Emergency Drugs available, Suction available, Patient being monitored and Timeout performed Patient Re-evaluated:Patient Re-evaluated prior to induction Oxygen Delivery Method: Circle system utilized Preoxygenation: Pre-oxygenation with 100% oxygen Induction Type: IV induction Ventilation: Mask ventilation without difficulty Laryngoscope Size: Mac and 4 Grade View: Grade I Tube type: Oral Tube size: 7.5 mm Number of attempts: 1 Airway Equipment and Method: Stylet Placement Confirmation: ETT inserted through vocal cords under direct vision, positive ETCO2 and breath sounds checked- equal and bilateral Secured at: 23 cm Tube secured with: Tape Dental Injury: Teeth and Oropharynx as per pre-operative assessment

## 2023-12-22 NOTE — Anesthesia Procedure Notes (Signed)
Anesthesia Regional Block: TAP block   Pre-Anesthetic Checklist: , timeout performed,  Correct Patient, Correct Site, Correct Laterality,  Correct Procedure, Correct Position, site marked,  Risks and benefits discussed,  Surgical consent,  Pre-op evaluation,  At surgeon's request and post-op pain management  Laterality: Right  Prep: chloraprep       Needles:  Injection technique: Single-shot  Needle Type: Echogenic Stimulator Needle     Needle Length: 9cm  Needle Gauge: 21     Additional Needles:   Procedures:,,,, ultrasound used (permanent image in chart),,    Narrative:  Start time: 12/22/2023 11:50 AM End time: 12/22/2023 11:54 AM Injection made incrementally with aspirations every 5 mL.  Performed by: Personally  Anesthesiologist: Linton Rump, MD  Additional Notes: Discussed risks and benefits of nerve block including, but not limited to, prolonged and/or permanent nerve injury involving sensory and/or motor function. Monitors were applied and a time-out was performed. The nerve and associated structures were visualized under ultrasound guidance. After negative aspiration, local anesthetic was slowly injected around the nerve. There was no evidence of high pressure during the procedure. There were no paresthesias. VSS remained stable and the patient tolerated the procedure well.

## 2023-12-23 ENCOUNTER — Encounter (HOSPITAL_COMMUNITY): Payer: Self-pay | Admitting: General Surgery

## 2023-12-23 LAB — SURGICAL PATHOLOGY

## 2023-12-23 NOTE — Anesthesia Postprocedure Evaluation (Signed)
Anesthesia Post Note  Patient: [name] [name]  Procedure(s) Performed: OPEN RIGHT INGUINAL HERNIA REPAIR WITH MESH (Right)     Patient location during evaluation: PACU Anesthesia Type: General Level of consciousness: awake Pain management: pain level controlled Vital Signs Assessment: post-procedure vital signs reviewed and stable Respiratory status: spontaneous breathing, nonlabored ventilation and respiratory function stable Cardiovascular status: blood pressure returned to baseline and stable Postop Assessment: no apparent nausea or vomiting Anesthetic complications: no   No notable events documented.  Last Vitals:  Vitals:   12/22/23 1515 12/22/23 1600  BP: (!) 122/59 (!) 142/62  Pulse: 77 64  Resp: 14 16  Temp:  36.8 C  SpO2: 100% 96%    Last Pain:  Vitals:   12/22/23 1600  TempSrc:   PainSc: 2                  Linton Rump

## 2023-12-31 ENCOUNTER — Ambulatory Visit: Payer: PPO | Admitting: Urology

## 2024-01-14 ENCOUNTER — Other Ambulatory Visit: Payer: Self-pay

## 2024-01-14 ENCOUNTER — Other Ambulatory Visit: Payer: Self-pay | Admitting: Allergy

## 2024-01-14 ENCOUNTER — Encounter: Payer: Self-pay | Admitting: Allergy

## 2024-01-14 ENCOUNTER — Ambulatory Visit: Payer: PPO | Admitting: Allergy

## 2024-01-14 VITALS — BP 132/70 | HR 61 | Temp 98.5°F | Resp 16

## 2024-01-14 DIAGNOSIS — J3089 Other allergic rhinitis: Secondary | ICD-10-CM | POA: Diagnosis not present

## 2024-01-14 DIAGNOSIS — J4530 Mild persistent asthma, uncomplicated: Secondary | ICD-10-CM | POA: Diagnosis not present

## 2024-01-14 DIAGNOSIS — Z91038 Other insect allergy status: Secondary | ICD-10-CM | POA: Diagnosis not present

## 2024-01-14 MED ORDER — FLUTICASONE-SALMETEROL 115-21 MCG/ACT IN AERO: 2.0000 | INHALATION_SPRAY | Freq: Two times a day (BID) | RESPIRATORY_TRACT | 3 refills | Status: DC

## 2024-01-14 NOTE — Patient Instructions (Addendum)
Mild persistent asthma Breathing today unremarkable today. During respiratory infections/flares:  Start Advair 2 puffs twice a day with spacer and rinse mouth afterwards for 1-2 weeks until your breathing symptoms return to baseline.  May use albuterol rescue inhaler 2 puffs every 4 to 6 hours as needed for shortness of breath, chest tightness, coughing, and wheezing. May use albuterol rescue inhaler 2 puffs 5 to 15 minutes prior to strenuous physical activities. Monitor frequency of use - if you need to use it more than twice per week on a consistent basis let us know.  Breathing control goals:  Full participation in all desired activities (may need albuterol before activity) Albuterol use two times or less a week on average (not counting use with activity) Cough interfering with sleep two times or less a month Oral steroids no more than once a year No hospitalizations   If the above inhaler is not covered check the pricing on the following: Dulera Advair Diskus Wixela Breo Symbicort Breyna  Perennial allergic rhinitis 2018 skin testing positive to mold, cockroach and dust mites. Continue environmental control measures. Continue Singulair (montelukast) 10mg  daily at night. Use over the counter antihistamines such as Zyrtec (cetirizine), Claritin (loratadine), Allegra (fexofenadine), or Xyzal (levocetirizine) daily as needed. May switch antihistamines every few months. Use Flonase (fluticasone) nasal spray 1-2 sprays per nostril once a day as needed for nasal congestion.  Use azelastine nasal spray 1-2 sprays per nostril twice a day as needed for runny nose/drainage. Nasal saline spray (i.e., Simply Saline) or nasal saline lavage (i.e., NeilMed) is recommended as needed and prior to medicated nasal sprays.  Return in about 6 months (around 07/13/2024). Or sooner if needed.

## 2024-01-14 NOTE — Progress Notes (Signed)
Follow Up Note  RE: [name] [name] MRN: [id number] DOB: 03/26/1946 Date of Office Visit: 01/14/2024  Referring provider: Linus Galas, NP Primary care provider: Linus Galas, NP  Chief Complaint: Asthma and Allergies  History of Present Illness: I had the pleasure of seeing [name] [name] for a follow up visit at the [facility] on 01/14/2024. He is a 78 y.o. male, who is being followed for asthma and allergic rhinitis. His previous allergy office visit was on 07/14/2023 with Dr. Selena Batten. Today is a regular follow up visit.  Discussed the use of AI scribe software for clinical note transcription with the patient, who gave verbal consent to proceed.    He has been off his Flovent inhaler for a couple of months due to inability to get the inhaler. Despite this, he feels fine and has not noticed a significant difference in his breathing. He has used his rescue inhaler, albuterol, a couple of times but not more frequently than when he was on Flovent. He recalls experiencing a little wheezing but no recent episodes of coughing, shortness of breath, or chest tightness. He has not required emergency care for his asthma. He continues to take montelukast (Singulair) regularly.  Regarding his allergies, he experiences persistent symptoms including sneezing, coughing up mucus, nasal drainage, and post-nasal drip, which occur every morning. He uses both fluticasone and azelastine nasal sprays, administering two sprays per nostril. He uses azelastine twice daily as it provides more relief than fluticasone. He also finds nasal rinses helpful, particularly in the morning.     Assessment and Plan: [name] [name] is a 78 y.o. male with: Mild persistent asthma without complication Past history - triggers include humidity.  Interim history - off Flovent for a couple of months with no significant change in symptoms. Occasional use of Albuterol for wheezing episodes. Continues on Montelukast. Today's spirometry  was unremarkable. During respiratory infections/flares:  Start Advair 2 puffs twice a day with spacer and rinse mouth afterwards for 1-2 weeks until your breathing symptoms return to baseline.  If not covered let us know. May use albuterol rescue inhaler 2 puffs every 4 to 6 hours as needed for shortness of breath, chest tightness, coughing, and wheezing. May use albuterol rescue inhaler 2 puffs 5 to 15 minutes prior to strenuous physical activities. Monitor frequency of use - if you need to use it more than twice per week on a consistent basis let us know.  Get spirometry at next visit.   Allergic rhinitis due to dust mite Allergic rhinitis due to mold Allergy to cockroaches Past history - 2018 skin testing positive to mold, cockroach and dust mites. Not interested in AIT. Interim history - Persistent symptoms of rhinitis despite use of Fluticasone and Azelastine nasal sprays. Saline rinses help the most.  Continue environmental control measures. Continue Singulair (montelukast) 10mg  daily at night. Use over the counter antihistamines such as Zyrtec (cetirizine), Claritin (loratadine), Allegra (fexofenadine), or Xyzal (levocetirizine) daily as needed. May switch antihistamines every few months. Use Flonase (fluticasone) nasal spray 1-2 sprays per nostril once a day as needed for nasal congestion.  Use azelastine nasal spray 1-2 sprays per nostril twice a day as needed for runny nose/drainage. Nasal saline spray (i.e., Simply Saline) or nasal saline lavage (i.e., NeilMed) is recommended as needed and prior to medicated nasal sprays. Encouraged daily use.   Return in about 6 months (around 07/13/2024).  Meds ordered this encounter  Medications   DISCONTD: fluticasone-salmeterol (ADVAIR HFA) 115-21 MCG/ACT  inhaler    Sig: Inhale 2 puffs into the lungs 2 (two) times daily. with spacer and rinse mouth afterwards. Use 1-2 weeks during an asthma flare.    Dispense:  1 each    Refill:  3    Lab Orders  No laboratory test(s) ordered today    Diagnostics: Spirometry:  Tracings reviewed. His effort: Good reproducible efforts. FVC: 2.84L FEV1: 2.32L, 74% predicted FEV1/FVC ratio: 82% Interpretation: No overt abnormalities noted given today's efforts.  Please see scanned spirometry results for details.  Results discussed with patient/family.   Medication List:  Current Outpatient Medications  Medication Sig Dispense Refill   acetaminophen (TYLENOL) 500 MG tablet Take 1,000 mg by mouth at bedtime as needed for moderate pain (pain score 4-6) or mild pain (pain score 1-3) (pain).     albuterol (VENTOLIN HFA) 108 (90 Base) MCG/ACT inhaler INHALE 1-2 PUFFS BY MOUTH EVERY 6 HOURS AS NEEDED FOR WHEEZE OR SHORTNESS OF BREATH 18 each 1   Azelastine HCl 137 MCG/SPRAY SOLN USE 1 OR 2 SPRAYS INTO EACH NOSTRIL TWICE DAILY AS NEEDED FOR RUNNY NOSE/DRAINAGE. 30 mL 5   diphenhydrAMINE (BENADRYL) 25 MG tablet Take 25 mg by mouth at bedtime as needed for allergies.     fluticasone (FLONASE) 50 MCG/ACT nasal spray USE 2 SPRAYS INTO EACH NOSTRIL ONCE A DAY AS NEEDED FOR STUFFY NOSE 48 mL 3   hydrochlorothiazide (MICROZIDE) 12.5 MG capsule Take 12.5 mg by mouth every other day.     hydrOXYzine (ATARAX) 25 MG tablet Take 25 mg by mouth daily as needed for anxiety.     isosorbide mononitrate (IMDUR) 30 MG 24 hr tablet TAKE 1 TABLET BY MOUTH EVERY DAY 90 tablet 3   levocetirizine (XYZAL) 5 MG tablet TAKE 1 TABLET BY MOUTH ONCE DAILY AS NEEDED FOR RUNNY NOSE (Patient taking differently: Take 5 mg by mouth every evening.) 90 tablet 1   metoprolol succinate (TOPROL-XL) 25 MG 24 hr tablet TAKE 2 TABLETS (50 MG TOTAL) BY MOUTH DAILY WITH OR IMMEDIATELY FOLLOWING A MEAL 180 tablet 3   montelukast (SINGULAIR) 10 MG tablet Take 1 tablet (10 mg total) by mouth at bedtime. 30 tablet 5   pantoprazole (PROTONIX) 40 MG tablet TAKE 1 TABLET BY MOUTH EVERY DAY (Patient taking differently: Take 40 mg by mouth  daily as needed (flare-up).) 30 tablet 6   promethazine-dextromethorphan (PROMETHAZINE-DM) 6.25-15 MG/5ML syrup Take 5 mLs by mouth 4 (four) times daily as needed. 100 mL 0   rosuvastatin (CRESTOR) 10 MG tablet TAKE 1 TABLET BY MOUTH EVERY DAY 90 tablet 3   tamsulosin (FLOMAX) 0.4 MG CAPS capsule TAKE ONE CAPSULE BY MOUTH IN THE MORNING AND AT BEDTIME 180 capsule 3   triamcinolone cream (KENALOG) 0.1 % Apply 1 Application topically daily as needed (itching /dry skin).     ADVAIR HFA 115-21 MCG/ACT inhaler Inhale 2 puffs into the lungs 2 (two) times daily. 1 each 5   Multiple Vitamins-Minerals (MULTIVITAMIN WITH MINERALS) tablet Take 2 tablets by mouth daily. Life extention (Patient not taking: Reported on 01/14/2024)     No current facility-administered medications for this visit.   Allergies: Allergies  Allergen Reactions   Percocet [Oxycodone-Acetaminophen] Nausea And Vomiting   Sulfacetamide Sodium Hives   I reviewed his past medical history, social history, family history, and environmental history and no significant changes have been reported from his previous visit.  Review of Systems  Constitutional:  Negative for appetite change, chills, fever and unexpected weight change.  HENT:  Positive for postnasal drip and rhinorrhea. Negative for congestion.   Eyes:  Negative for itching.  Respiratory:  Negative for cough, chest tightness, shortness of breath and wheezing.   Cardiovascular:  Negative for chest pain.  Gastrointestinal:  Negative for abdominal pain.  Genitourinary:  Negative for difficulty urinating.  Skin:  Negative for rash.  Allergic/Immunologic: Positive for environmental allergies.  Neurological:  Negative for headaches.    Objective: BP 132/70 (BP Location: Right Arm, Patient Position: Sitting, Cuff Size: Normal)   Pulse 61   Temp 98.5 F (36.9 C) (Temporal)   Resp 16   SpO2 99%  There is no height or weight on file to calculate BMI. Physical Exam Vitals and  nursing note reviewed.  Constitutional:      Appearance: Normal appearance. He is well-developed.  HENT:     Head: Normocephalic and atraumatic.     Right Ear: Tympanic membrane and external ear normal.     Left Ear: Tympanic membrane and external ear normal.     Nose: Nose normal.     Mouth/Throat:     Mouth: Mucous membranes are moist.     Pharynx: Oropharynx is clear.  Eyes:     Conjunctiva/sclera: Conjunctivae normal.  Cardiovascular:     Rate and Rhythm: Normal rate and regular rhythm.     Heart sounds: Normal heart sounds. No murmur heard.    No friction rub. No gallop.  Pulmonary:     Effort: Pulmonary effort is normal.     Breath sounds: Normal breath sounds. No wheezing, rhonchi or rales.  Musculoskeletal:     Cervical back: Neck supple.  Skin:    General: Skin is warm.     Findings: No rash.  Neurological:     Mental Status: He is alert and oriented to person, place, and time.  Psychiatric:        Behavior: Behavior normal.    Previous notes and tests were reviewed. The plan was reviewed with the patient/family, and all questions/concerned were addressed.  It was my pleasure to see [name] [name] today and participate in his care. Please feel free to contact me with any questions or concerns.  Sincerely,  Wyline Mood, DO Allergy & Immunology  [facility] office: [phone] [facility] office: [phone]

## 2024-01-16 ENCOUNTER — Ambulatory Visit: Payer: PPO | Admitting: Urology

## 2024-01-16 VITALS — BP 113/64 | HR 60

## 2024-01-16 DIAGNOSIS — N401 Enlarged prostate with lower urinary tract symptoms: Secondary | ICD-10-CM

## 2024-01-16 DIAGNOSIS — R3912 Poor urinary stream: Secondary | ICD-10-CM

## 2024-01-16 DIAGNOSIS — N138 Other obstructive and reflux uropathy: Secondary | ICD-10-CM

## 2024-01-16 NOTE — Progress Notes (Signed)
Bladder Scan completed today.  Patient can void prior to the bladder scan. Bladder scan result: 189  Performed By: [facility] LPN

## 2024-01-16 NOTE — Progress Notes (Signed)
 01/16/2024 10:35 AM   [name] [name] 1946/02/08 [id number]  Referring provider: Linus Galas, NP 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 IPSS 12 QOL on 1 on flomax 0.4mg  BID. Urine stream fair. Nocturia 1x. PVR 189cc. He he has intermittent straining to urinate. Nocturia 2-3x.    PMH: Past Medical History:  Diagnosis Date   Arthritis    Asthma    uses inhaler daily and as needed   Blood transfusion 2012   surgery- esophageal bleeding secondary to hiatal hernia   CKD (chronic kidney disease)    Constipation    Coronary artery disease    Diverticulitis    Diverticulosis    Eczema    Esophageal stricture    GERD (gastroesophageal reflux disease)    HBP (high blood pressure)    on meds   Heart murmur    History of kidney stones    HTN (hypertension)    Hyperlipemia    on meds   Kidney stones    hx of   Pre-diabetes    Seasonal allergies    Trouble swallowing    Ulcer     Surgical History: Past Surgical History:  Procedure Laterality Date   COLONOSCOPY  2012   DB-F/V-mira(good)-tics   HIATAL HERNIA REPAIR  1992&2012   INGUINAL HERNIA REPAIR Right 12/22/2023   Procedure: OPEN RIGHT INGUINAL HERNIA REPAIR WITH MESH;  Surgeon: [name] Boring, MD;  Location: [facility];  Service: General;  Laterality: Right;  CHOICE TAP BLOCK   LAPAROSCOPIC NISSEN FUNDOPLICATION  1991, 09/24/11   WISDOM TOOTH EXTRACTION      Home Medications:  Allergies as of 01/16/2024       Reactions   Percocet [oxycodone-acetaminophen] Nausea And Vomiting   Sulfacetamide Sodium Hives        Medication List        Accurate as of January 16, 2024 10:35 AM. If you have any questions, ask your nurse or doctor.          acetaminophen 500 MG tablet Commonly known as: TYLENOL Take 1,000 mg by mouth at bedtime as needed for moderate pain (pain score 4-6) or mild pain (pain  score 1-3) (pain).   Advair HFA 115-21 MCG/ACT inhaler Generic drug: fluticasone-salmeterol Inhale 2 puffs into the lungs 2 (two) times daily.   albuterol 108 (90 Base) MCG/ACT inhaler Commonly known as: VENTOLIN HFA INHALE 1-2 PUFFS BY MOUTH EVERY 6 HOURS AS NEEDED FOR WHEEZE OR SHORTNESS OF BREATH   Azelastine HCl 137 MCG/SPRAY Soln USE 1 OR 2 SPRAYS INTO EACH NOSTRIL TWICE DAILY AS NEEDED FOR RUNNY NOSE/DRAINAGE.   diphenhydrAMINE 25 MG tablet Commonly known as: BENADRYL Take 25 mg by mouth at bedtime as needed for allergies.   fluticasone 50 MCG/ACT nasal spray Commonly known as: FLONASE USE 2 SPRAYS INTO EACH NOSTRIL ONCE A DAY AS NEEDED FOR STUFFY NOSE   hydrochlorothiazide 12.5 MG capsule Commonly known as: MICROZIDE Take 12.5 mg by mouth every other day.   hydrOXYzine 25 MG tablet Commonly known as: ATARAX Take 25 mg by mouth daily as needed for anxiety.   isosorbide mononitrate 30 MG 24 hr tablet Commonly known as: IMDUR TAKE 1 TABLET BY MOUTH EVERY DAY   levocetirizine 5 MG tablet Commonly known as: XYZAL TAKE 1 TABLET BY MOUTH ONCE DAILY AS NEEDED FOR RUNNY NOSE What changed: See the new instructions.  metoprolol succinate 25 MG 24 hr tablet Commonly known as: TOPROL-XL TAKE 2 TABLETS (50 MG TOTAL) BY MOUTH DAILY WITH OR IMMEDIATELY FOLLOWING A MEAL   montelukast 10 MG tablet Commonly known as: SINGULAIR Take 1 tablet (10 mg total) by mouth at bedtime.   multivitamin with minerals tablet Take 2 tablets by mouth daily. Life extention   pantoprazole 40 MG tablet Commonly known as: PROTONIX TAKE 1 TABLET BY MOUTH EVERY DAY What changed:  when to take this reasons to take this   promethazine-dextromethorphan 6.25-15 MG/5ML syrup Commonly known as: PROMETHAZINE-DM Take 5 mLs by mouth 4 (four) times daily as needed.   rosuvastatin 10 MG tablet Commonly known as: CRESTOR TAKE 1 TABLET BY MOUTH EVERY DAY   tamsulosin 0.4 MG Caps capsule Commonly  known as: FLOMAX TAKE ONE CAPSULE BY MOUTH IN THE MORNING AND AT BEDTIME   triamcinolone cream 0.1 % Commonly known as: KENALOG Apply 1 Application topically daily as needed (itching /dry skin).        Allergies:  Allergies  Allergen Reactions   Percocet [Oxycodone-Acetaminophen] Nausea And Vomiting   Sulfacetamide Sodium Hives    Family History: Family History  Problem Relation Age of Onset   Heart disease Father 77       heart attack   Colon cancer Neg Hx    Allergic rhinitis Neg Hx    Angioedema Neg Hx    Asthma Neg Hx    Eczema Neg Hx    Immunodeficiency Neg Hx    Urticaria Neg Hx    Colon polyps Neg Hx    Esophageal cancer Neg Hx    Rectal cancer Neg Hx    Stomach cancer Neg Hx     Social History:  reports that he quit smoking about 42 years ago. His smoking use included cigarettes. He has never been exposed to tobacco smoke. He has never used smokeless tobacco. He reports that he does not currently use alcohol. He reports that he does not use drugs.  ROS: All other review of systems were reviewed and are negative except what is noted above in HPI  Physical Exam: BP 113/64   Pulse 60   Constitutional:  Alert and oriented, No acute distress. HEENT: [place] AT, moist mucus membranes.  Trachea midline, no masses. Cardiovascular: No clubbing, cyanosis, or edema. Respiratory: Normal respiratory effort, no increased work of breathing. GI: Abdomen is soft, nontender, nondistended, no abdominal masses GU: No CVA tenderness.  Lymph: No cervical or inguinal lymphadenopathy. Skin: No rashes, bruises or suspicious lesions. Neurologic: Grossly intact, no focal deficits, moving all 4 extremities. Psychiatric: Normal mood and affect.  Laboratory Data: Lab Results  Component Value Date   WBC 5.2 12/08/2023   HGB 14.5 12/08/2023   HCT 44.3 12/08/2023   MCV 94.7 12/08/2023   PLT 184 12/08/2023    Lab Results  Component Value Date   CREATININE 1.17 12/08/2023    No  results found for: "PSA"  No results found for: "TESTOSTERONE"  No results found for: "HGBA1C"  Urinalysis    Component Value Date/Time   COLORURINE YELLOW 10/15/2015 2004   APPEARANCEUR Clear 11/29/2022 0841   LABSPEC 1.015 10/15/2015 2004   PHURINE 6.5 10/15/2015 2004   GLUCOSEU Negative 11/29/2022 0841   HGBUR NEGATIVE 10/15/2015 2004   BILIRUBINUR Negative 11/29/2022 0841   KETONESUR NEGATIVE 10/15/2015 2004   PROTEINUR Negative 11/29/2022 0841   PROTEINUR NEGATIVE 10/15/2015 2004   UROBILINOGEN 0.2 08/27/2011 1750   NITRITE Negative 11/29/2022 0841  NITRITE NEGATIVE 10/15/2015 2004   LEUKOCYTESUR Trace (A) 11/29/2022 0841    Lab Results  Component Value Date   LABMICR See below: 11/29/2022   WBCUA 0-5 11/29/2022   LABEPIT 0-10 11/29/2022   BACTERIA None seen 11/29/2022    Pertinent Imaging:  No results found for this or any previous visit.  No results found for this or any previous visit.  No results found for this or any previous visit.  No results found for this or any previous visit.  No results found for this or any previous visit.  No results found for this or any previous visit.  No results found for this or any previous visit.  No results found for this or any previous visit.   Assessment & Plan:    1. Benign prostatic hyperplasia with urinary obstruction (Primary) Continue flomax 0.4mg  BID PSa today, will call with results - BLADDER SCAN AMB NON-IMAGING  2. Weak urinary stream Continue flomax 0.4mg  BID   No follow-ups on file.  Wilkie Aye, MD  [facility] [place]

## 2024-01-17 LAB — PSA, TOTAL AND FREE
PSA, Free Pct: 38.5 %
PSA, Free: 0.77 ng/mL
Prostate Specific Ag, Serum: 2 ng/mL (ref 0.0–4.0)

## 2024-01-20 ENCOUNTER — Encounter: Payer: Self-pay | Admitting: Urology

## 2024-01-20 MED ORDER — TAMSULOSIN HCL 0.4 MG PO CAPS: ORAL_CAPSULE | ORAL | 3 refills | Status: DC

## 2024-01-20 NOTE — Patient Instructions (Signed)

## 2024-01-22 ENCOUNTER — Other Ambulatory Visit: Payer: Self-pay | Admitting: Family Medicine

## 2024-02-13 ENCOUNTER — Other Ambulatory Visit: Payer: Self-pay | Admitting: Family Medicine

## 2024-02-21 ENCOUNTER — Other Ambulatory Visit: Payer: Self-pay | Admitting: Family

## 2024-02-26 DIAGNOSIS — M9904 Segmental and somatic dysfunction of sacral region: Secondary | ICD-10-CM | POA: Diagnosis not present

## 2024-02-26 DIAGNOSIS — M5136 Other intervertebral disc degeneration, lumbar region with discogenic back pain only: Secondary | ICD-10-CM | POA: Diagnosis not present

## 2024-02-26 DIAGNOSIS — M9905 Segmental and somatic dysfunction of pelvic region: Secondary | ICD-10-CM | POA: Diagnosis not present

## 2024-02-26 DIAGNOSIS — M9903 Segmental and somatic dysfunction of lumbar region: Secondary | ICD-10-CM | POA: Diagnosis not present

## 2024-03-03 ENCOUNTER — Other Ambulatory Visit: Payer: Self-pay | Admitting: Cardiovascular Disease

## 2024-03-04 DIAGNOSIS — M9904 Segmental and somatic dysfunction of sacral region: Secondary | ICD-10-CM | POA: Diagnosis not present

## 2024-03-04 DIAGNOSIS — M5136 Other intervertebral disc degeneration, lumbar region with discogenic back pain only: Secondary | ICD-10-CM | POA: Diagnosis not present

## 2024-03-04 DIAGNOSIS — M9903 Segmental and somatic dysfunction of lumbar region: Secondary | ICD-10-CM | POA: Diagnosis not present

## 2024-03-04 DIAGNOSIS — M9905 Segmental and somatic dysfunction of pelvic region: Secondary | ICD-10-CM | POA: Diagnosis not present

## 2024-03-22 DIAGNOSIS — M9903 Segmental and somatic dysfunction of lumbar region: Secondary | ICD-10-CM | POA: Diagnosis not present

## 2024-03-22 DIAGNOSIS — M9905 Segmental and somatic dysfunction of pelvic region: Secondary | ICD-10-CM | POA: Diagnosis not present

## 2024-03-22 DIAGNOSIS — M9904 Segmental and somatic dysfunction of sacral region: Secondary | ICD-10-CM | POA: Diagnosis not present

## 2024-03-22 DIAGNOSIS — M5136 Other intervertebral disc degeneration, lumbar region with discogenic back pain only: Secondary | ICD-10-CM | POA: Diagnosis not present

## 2024-03-25 ENCOUNTER — Ambulatory Visit
Admission: EM | Admit: 2024-03-25 | Discharge: 2024-03-25 | Disposition: A | Discharge status: Home / Self Care | Attending: Nurse Practitioner | Admitting: Nurse Practitioner

## 2024-03-25 DIAGNOSIS — J309 Allergic rhinitis, unspecified: Secondary | ICD-10-CM

## 2024-03-25 DIAGNOSIS — J4521 Mild intermittent asthma with (acute) exacerbation: Secondary | ICD-10-CM

## 2024-03-25 LAB — POC SARS CORONAVIRUS 2 AG -  ED: SARS Coronavirus 2 Ag: NEGATIVE

## 2024-03-25 MED ORDER — DEXAMETHASONE SODIUM PHOSPHATE 10 MG/ML IJ SOLN
10.0000 mg | Freq: Once | INTRAMUSCULAR | Status: AC
  Administered 2024-03-25: 10 mg via INTRAMUSCULAR

## 2024-03-25 NOTE — ED Triage Notes (Signed)
 Pt report dizziness, ear fullness, intermittent cough, sinus pressure, headache x 2 days. Pt states he noticed sx's after doing some yard work a few days ago.

## 2024-03-25 NOTE — ED Provider Notes (Signed)
 [facility]    CSN: [id number] Arrival date & time: 03/25/24  1157      History   Chief Complaint No chief complaint on file.   HPI [name] [name] is a 78 y.o. male.   Patient presents today with 2-day history of runny and stuffy nose, pressure around ears and in ears, nausea, and general head pressure.  He also endorses dizziness, room spinning sensation, ringing in the ears, and headache worse when he goes from sitting to standing or when he bends over.  Also endorses fatigue.  No body aches, fever, chills, significant cough, shortness of breath, wheezing, chest pain or tightness, sore throat, abdominal pain, vomiting, or diarrhea.  Reports he and his wife have been working out in the yard quite a bit and he thinks symptoms are due to allergies.  He takes an oral antihistamine daily as well as an antihistamine nasal spray and Flonase  nasal spray to control allergy symptoms daily.  Has also been taking Mucinex and symptoms started without much improvement.  Patient reports history of asthma, has not felt the need for his rescue inhaler since symptoms began.  His wife is sick with similar symptoms.    Past Medical History:  Diagnosis Date   Arthritis    Asthma    uses inhaler daily and as needed   Blood transfusion 2012   surgery- esophageal bleeding secondary to hiatal hernia   CKD (chronic kidney disease)    Constipation    Coronary artery disease    Diverticulitis    Diverticulosis    Eczema    Esophageal stricture    GERD (gastroesophageal reflux disease)    HBP (high blood pressure)    on meds   Heart murmur    History of kidney stones    HTN (hypertension)    Hyperlipemia    on meds   Kidney stones    hx of   Pre-diabetes    Seasonal allergies    Trouble swallowing    Ulcer     Patient Active Problem List   Diagnosis Date Noted   Asthma 07/01/2022   Benign prostatic hyperplasia without lower urinary tract symptoms 07/01/2022   Chronic kidney  disease due to hypertension 07/01/2022   Chronic kidney disease, 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   History of COVID-19 07/01/2022   History of urinary stone 07/01/2022   Hyperlipidemia 07/01/2022   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   Solitary pulmonary nodule 07/01/2022   Tobacco user 07/01/2022   Vitamin D deficiency 07/01/2022   Pain in lower limb 11/09/2021   Pain of left lower leg 11/09/2021   Gastroesophageal reflux disease 07/30/2019   Low back pain 07/03/2018   Pain in right knee 07/03/2018   Coughing/wheezing 06/15/2018   Perennial allergic rhinitis 02/24/2017   Mild persistent asthma without complication 02/24/2017   Subacute cough 02/24/2017   H/O: GI bleed 08/14/2012   Ulcer of esophagus with bleeding 08/14/2012   S/P Nissen fundoplication (without gastrostomy tube) procedure 11/08/2011    Past Surgical History:  Procedure Laterality Date   COLONOSCOPY  2012   DB-F/V-mira(good)-tics   HIATAL HERNIA REPAIR  1992&2012   INGUINAL HERNIA REPAIR Right 12/22/2023   Procedure: OPEN RIGHT INGUINAL HERNIA REPAIR WITH MESH;  Surgeon: Cannon Champion, MD;  Location: [facility];  Service: General;  Laterality: Right;  CHOICE TAP BLOCK   LAPAROSCOPIC NISSEN FUNDOPLICATION  1991, 09/24/11   WISDOM TOOTH EXTRACTION  Home Medications    Prior to Admission medications   Medication Sig Start Date End Date Taking? Authorizing Provider  acetaminophen  (TYLENOL ) 500 MG tablet Take 1,000 mg by mouth at bedtime as needed for moderate pain (pain score 4-6) or mild pain (pain score 1-3) (pain).    [provider]  ADVAIR [facility] 115-21 MCG/ACT inhaler Inhale 2 puffs into the lungs 2 (two) times daily. 01/14/24   Trudy Fusi, DO  albuterol  (VENTOLIN  HFA) 108 (90 Base) MCG/ACT inhaler INHALE 1-2 PUFFS BY MOUTH EVERY 6 HOURS AS NEEDED FOR WHEEZE OR SHORTNESS OF BREATH 02/13/24   Trudy Fusi, DO  Azelastine  HCl 137 MCG/SPRAY  SOLN USE 1 OR 2 SPRAYS INTO EACH NOSTRIL TWICE DAILY AS NEEDED FOR RUNNY NOSE/DRAINAGE. 07/01/22   Tinnie Forehand, FNP  diphenhydrAMINE  (BENADRYL ) 25 MG tablet Take 25 mg by mouth at bedtime as needed for allergies.    [provider]  fluticasone  (FLONASE ) 50 MCG/ACT nasal spray USE 2 SPRAYS INTO EACH NOSTRIL ONCE A DAY AS NEEDED FOR STUFFY NOSE 02/23/24   Tinnie Forehand, FNP  hydrochlorothiazide  (MICROZIDE ) 12.5 MG capsule TAKE 1 CAPSULE BY MOUTH EVERY DAY 03/03/24   Hugh Madura, MD  hydrOXYzine (ATARAX) 25 MG tablet Take 25 mg by mouth daily as needed for anxiety.    [provider]  isosorbide  mononitrate (IMDUR ) 30 MG 24 hr tablet TAKE 1 TABLET BY MOUTH EVERY DAY 11/24/23   Hugh Madura, MD  levocetirizine (XYZAL ) 5 MG tablet TAKE 1 TABLET BY MOUTH ONCE DAILY AS NEEDED FOR RUNNY NOSE Patient taking differently: Take 5 mg by mouth every evening. 03/04/23   Ardie Kras, FNP  metoprolol  succinate (TOPROL -XL) 25 MG 24 hr tablet TAKE 2 TABLETS (50 MG TOTAL) BY MOUTH DAILY WITH OR IMMEDIATELY FOLLOWING A MEAL 11/24/23   Hugh Madura, MD  montelukast  (SINGULAIR ) 10 MG tablet TAKE 1 TABLET BY MOUTH EVERYDAY AT BEDTIME 01/22/24   Ambs, Jeanmarie Millet, FNP  Multiple Vitamins-Minerals (MULTIVITAMIN WITH MINERALS) tablet Take 2 tablets by mouth daily. Life extention Patient not taking: Reported on 01/14/2024    [provider]  pantoprazole  (PROTONIX ) 40 MG tablet TAKE 1 TABLET BY MOUTH EVERY DAY Patient taking differently: Take 40 mg by mouth daily as needed (flare-up). 09/15/17   Tobin Forts, MD  promethazine -dextromethorphan (PROMETHAZINE -DM) 6.25-15 MG/5ML syrup Take 5 mLs by mouth 4 (four) times daily as needed. 11/08/23   Corbin Dess, PA-C  rosuvastatin  (CRESTOR ) 10 MG tablet TAKE 1 TABLET BY MOUTH EVERY DAY 11/24/23   Hugh Madura, MD  tamsulosin  (FLOMAX ) 0.4 MG CAPS capsule TAKE ONE CAPSULE BY MOUTH IN THE MORNING AND AT BEDTIME 01/20/24   McKenzie, Arden Beck, MD   triamcinolone cream (KENALOG) 0.1 % Apply 1 Application topically daily as needed (itching /dry skin). 02/23/20   [provider]  budesonide  (PULMICORT  FLEXHALER) 180 MCG/ACT inhaler Inhale 2 puffs into the lungs 2 (two) times daily. 09/27/19 09/27/19  Bobbitt, Colen Daunt, MD    Family History Family History  Problem Relation Age of Onset   Heart disease Father 30       heart attack   Colon cancer Neg Hx    Allergic rhinitis Neg Hx    Angioedema Neg Hx    Asthma Neg Hx    Eczema Neg Hx    Immunodeficiency Neg Hx    Urticaria Neg Hx    Colon polyps Neg Hx    Esophageal cancer Neg Hx  Rectal cancer Neg Hx    Stomach cancer Neg Hx     Social History Social History   Tobacco Use   Smoking status: Former    Current packs/day: 0.00    Types: Cigarettes    Quit date: 11/25/1981    Years since quitting: 42.3    Passive exposure: Never   Smokeless tobacco: Never  Vaping Use   Vaping status: Never Used  Substance Use Topics   Alcohol use: Not Currently    Alcohol/week: 0.0 - 3.0 standard drinks of alcohol   Drug use: No     Allergies   Percocet [oxycodone -acetaminophen ] and Sulfacetamide sodium   Review of Systems Review of Systems Per HPI  Physical Exam Triage Vital Signs ED Triage Vitals  Encounter Vitals Group     BP 03/25/24 1216 (!) 140/70     Systolic BP Percentile --      Diastolic BP Percentile --      Pulse Rate 03/25/24 1216 (!) 56     Resp 03/25/24 1216 20     Temp 03/25/24 1216 98.3 F (36.8 C)     Temp Source 03/25/24 1216 Oral     SpO2 03/25/24 1216 94 %     Weight --      Height --      Head Circumference --      Peak Flow --      Pain Score 03/25/24 1220 0     Pain Loc --      Pain Education --      Exclude from Growth Chart --    No data found.  Updated Vital Signs BP (!) 140/70 (BP Location: Right Arm)   Pulse (!) 56   Temp 98.3 F (36.8 C) (Oral)   Resp 20   SpO2 94%   Visual Acuity Right Eye Distance:   Left Eye  Distance:   Bilateral Distance:    Right Eye Near:   Left Eye Near:    Bilateral Near:     Physical Exam Vitals and nursing note reviewed.  Constitutional:      General: He is not in acute distress.    Appearance: Normal appearance. He is not toxic-appearing.  HENT:     Head: Normocephalic and atraumatic.     Right Ear: A middle ear effusion is present. A foreign body is present.     Left Ear: Hearing, ear canal and external ear normal. A middle ear effusion is present.     Ears:     Comments: Polyp to right external auditory canal    Nose: Nose normal. No congestion or rhinorrhea.     Mouth/Throat:     Mouth: Mucous membranes are moist.     Pharynx: Oropharynx is clear. No oropharyngeal exudate or posterior oropharyngeal erythema.  Cardiovascular:     Rate and Rhythm: Normal rate and regular rhythm.  Pulmonary:     Effort: Pulmonary effort is normal. No respiratory distress.     Breath sounds: Wheezing present. No rhonchi or rales.  Musculoskeletal:     Cervical back: Normal range of motion.  Lymphadenopathy:     Cervical: No cervical adenopathy.  Skin:    General: Skin is warm and dry.     Capillary Refill: Capillary refill takes less than 2 seconds.     Coloration: Skin is not jaundiced or pale.     Findings: No erythema.  Neurological:     Mental Status: He is alert and oriented to person, place, and time.  Psychiatric:        Behavior: Behavior is cooperative.      UC Treatments / Results  Labs (all labs ordered are listed, but only abnormal results are displayed) Labs Reviewed  POC SARS CORONAVIRUS 2 AG -  ED    EKG   Radiology No results found.  Procedures Procedures (including critical care time)  Medications Ordered in UC Medications  dexamethasone  (DECADRON ) injection 10 mg (10 mg Intramuscular Given 03/25/24 1311)    Initial Impression / Assessment and Plan / UC Course  I have reviewed the triage vital signs and the nursing  notes.  Pertinent labs & imaging results that were available during my care of the patient were reviewed by me and considered in my medical decision making (see chart for details).   Patient is well-appearing, normotensive, afebrile, not tachycardic, not tachypneic, oxygenating well on room air.    1. Allergic sinusitis 2. Mild intermittent asthma with acute exacerbation Suspect sinusitis secondary to allergies-continue allergy regimen Another differential includes viral etiology-COVID-19 testing is negative today Given wheezing on exam, will treat for asthma exacerbation; patient prefers IM dexamethasone  over oral corticosteroids which is reasonable Other supportive care discussed with patient  The patient was given the opportunity to ask questions.  All questions answered to their satisfaction.  The patient is in agreement to this plan.   Final Clinical Impressions(s) / UC Diagnoses   Final diagnoses:  Allergic sinusitis  Mild intermittent asthma with acute exacerbation     Discharge Instructions      You have a sinus infection likely from allergies.  We have given you a steroid injection today to help with the inflammation in your chest.  Start using the Advair and keep the rescue inhaler handy.  Symptoms should improve over the next week to 10 days.  If you develop chest pain or shortness of breath, go to the emergency room.  COVID-19 test is negative today.  Some things that can make you feel better are: - Increased rest - Increasing fluid with water/sugar free electrolytes - Acetaminophen  and ibuprofen  as needed for fever/pain - Salt water gargling, chloraseptic spray and throat lozenges for sore throat - OTC guaifenesin (Mucinex) 600 mg twice daily for congestion - Saline sinus flushes or a neti pot - Humidifying the air     ED Prescriptions   None    PDMP not reviewed this encounter.   Wilhemena Harbour, NP 03/25/24 [phone]

## 2024-03-25 NOTE — Discharge Instructions (Addendum)
 You have a sinus infection likely from allergies.  We have given you a steroid injection today to help with the inflammation in your chest.  Start using the Advair and keep the rescue inhaler handy.  Symptoms should improve over the next week to 10 days.  If you develop chest pain or shortness of breath, go to the emergency room.  COVID-19 test is negative today.  Some things that can make you feel better are: - Increased rest - Increasing fluid with water/sugar free electrolytes - Acetaminophen  and ibuprofen  as needed for fever/pain - Salt water gargling, chloraseptic spray and throat lozenges for sore throat - OTC guaifenesin (Mucinex) 600 mg twice daily for congestion - Saline sinus flushes or a neti pot - Humidifying the air

## 2024-04-05 ENCOUNTER — Other Ambulatory Visit: Payer: Self-pay | Admitting: Urology

## 2024-04-05 DIAGNOSIS — E785 Hyperlipidemia, unspecified: Secondary | ICD-10-CM | POA: Diagnosis not present

## 2024-04-05 DIAGNOSIS — M9904 Segmental and somatic dysfunction of sacral region: Secondary | ICD-10-CM | POA: Diagnosis not present

## 2024-04-05 DIAGNOSIS — R3912 Poor urinary stream: Secondary | ICD-10-CM

## 2024-04-05 DIAGNOSIS — I129 Hypertensive chronic kidney disease with stage 1 through stage 4 chronic kidney disease, or unspecified chronic kidney disease: Secondary | ICD-10-CM | POA: Diagnosis not present

## 2024-04-05 DIAGNOSIS — R7303 Prediabetes: Secondary | ICD-10-CM | POA: Diagnosis not present

## 2024-04-05 DIAGNOSIS — M9905 Segmental and somatic dysfunction of pelvic region: Secondary | ICD-10-CM | POA: Diagnosis not present

## 2024-04-05 DIAGNOSIS — Z125 Encounter for screening for malignant neoplasm of prostate: Secondary | ICD-10-CM | POA: Diagnosis not present

## 2024-04-05 DIAGNOSIS — M9903 Segmental and somatic dysfunction of lumbar region: Secondary | ICD-10-CM | POA: Diagnosis not present

## 2024-04-05 DIAGNOSIS — M5136 Other intervertebral disc degeneration, lumbar region with discogenic back pain only: Secondary | ICD-10-CM | POA: Diagnosis not present

## 2024-04-05 LAB — HEMOGLOBIN A1C: A1c: 6.1

## 2024-04-06 LAB — COMPREHENSIVE METABOLIC PANEL (CC13): EGFR: 59

## 2024-04-11 ENCOUNTER — Encounter: Payer: Self-pay | Admitting: Physician Assistant

## 2024-04-11 DIAGNOSIS — I2510 Atherosclerotic heart disease of native coronary artery without angina pectoris: Secondary | ICD-10-CM

## 2024-04-11 HISTORY — DX: Atherosclerotic heart disease of native coronary artery without angina pectoris: I25.10

## 2024-04-11 NOTE — Progress Notes (Signed)
 Cardiology Office Note:    Date:  04/12/2024  ID:  [name] [name], DOB 1946-09-27, MRN [id number] PCP: [name] Heritage, NP  [facility] Providers Cardiologist:  [name] Gathers, MD       Patient Profile:      Coronary artery disease CCTA 12/12/2022: CAC score 219 (49th percentile); RCA proximal-mid 70-99 (FFR 0.8), mid-distal 25-49; LM <25; LAD proximal and mid 50-69 (FFR 0.85), D1 25-49; LCx proximal 25-49, distal 70-99 (FFR 0.69) >> hemodynamically significant stenosis in distal LCx according to FFR-med Rx given distal vessel disease Hypertension Mild tricuspid regurgitation Mitral regurgitation TTE 08/15/22: EF 55-60, impaired relaxation, mild-moderate LVH trace AI, AV sclerosis, trace MR, trace to mild TR, trace PI FHx of CAD  Chronic kidney disease Hypertension Hyperlipidemia Prediabetes           Discussed the use of AI scribe software for clinical note transcription with the patient, who gave verbal consent to proceed.  History of Present Illness [name] [name] is a 78 y.o. male who returns for follow-up of CAD.  He was last seen by Dr. Renna [name] in November 2024. He has diffuse CAD based upon CCTA done in 11/2022. It did show severe distal LCx stenosis that was hemodynamically significant by FFR. Med Rx was recommended (distal vessel disease) unless he has refractory angina.   He is here with his wife.  He experiences significant fatigue, which he attributes to his medication regimen. Splitting his metoprolol  dose between morning and evening has slightly improved his energy levels. Despite this adjustment, he still experiences low energy, stating that he can perform one or two tasks before needing to rest. He clarifies that he sits down rather than lies down when resting. He experiences periodic chest discomfort, described as random and not associated with specific activities. The discomfort is brief, lasting only two to three seconds, and is not accompanied by heavy pressure or  shortness of breath. He has a history of asthma and believes that his occasional shortness of breath may be related to this. He is concerned about the potential impact of Crestor  on his blood sugar levels, as he is borderline diabetic. His recent glucose level was 106 and A1c was 6.1, indicating prediabetes. He maintains an active lifestyle, engaging in yard work and walking, although his activities are limited by his fatigue. He estimates taking about 9,000 steps a day when active. No significant changes in his symptoms since his last visit six months ago.   ROS-See HPI     Studies Reviewed:       Results Labs - Reviewed from [facility] on 04/12/2024  04/05/2024: Cholesterol 149, HDL 49, LDL 68, triglycerides 56 12/08/23: Hgb 14.5, SCr 1.17, K 4.3  Labs from primary care personally reviewed and interpreted 04/06/2024: LDL 68 (optimal), triglycerides 56 (normal), HDL 49, total cholesterol 130, creatinine 1.26, potassium 3.5 (elevated), ALT 17 (normal), hemoglobin 13.6    Risk Assessment/Calculations:             Physical Exam:   VS:  BP 114/70   Pulse 67   Ht 5\' 11"  (1.803 m)   Wt 180 lb 9.6 oz (81.9 kg)   BMI 25.19 kg/m    Wt Readings from Last 3 Encounters:  04/12/24 180 lb 9.6 oz (81.9 kg)  12/22/23 183 lb (83 kg)  12/08/23 183 lb (83 kg)    Constitutional:      Appearance: Healthy appearance. Not in distress.  Neck:     Vascular: JVD normal.  Pulmonary:  Breath sounds: Normal breath sounds. No wheezing. No rales.  Cardiovascular:     Normal rate. Regular rhythm.     Murmurs: There is no murmur.  Edema:    Peripheral edema absent.  Abdominal:     Palpations: Abdomen is soft.        Assessment and Plan:   Assessment & Plan Coronary artery disease involving native coronary artery of native heart with angina pectoris (HCC) CCTA in January 2024 with hemodynamically significant distal LCx stenosis by FFR.  He has been managed medically.  He has significant fatigue likely  related to beta-blocker therapy.  We discussed options for adjusting his medical therapy to see if we can alleviate some of his side effects.  We discussed changing his beta-blocker to a hydrophilic form to see if it causes less side effects (atenolol).  With shared decision making, we decided to proceed with decreasing his Toprol  XL to 25 mg in the evening to see if this alleviates his symptoms.  We also decided to hold his Crestor  for a few weeks to see if this improves his symptoms. -He cannot take aspirin  secondary to history of peptic ulcer disease and GI bleeding -Continue Imdur  30 mg daily -Change Toprol -XL to 25 mg every afternoon - Hold Crestor  for 3 to 4 weeks Primary hypertension Blood pressure well-controlled.  Continue HCTZ 12.5 mg daily, Imdur  30 mg daily.  Change Toprol -XL to 25 mg daily.  Monitor blood pressure and notify if blood pressure readings increasing with decreased dose of metoprolol  succinate. Pure hypercholesterolemia Hold Crestor  for 3 to 4 weeks as noted.  After resuming, if symptoms recur, we will consider alternative therapies such as another statin, ezetimibe or referral to lipid clinic for PCSK9 inhibitor.         Dispo:  Return in about 3 months (around 07/13/2024) for Routine Follow Up, w/ Marlyse Single, PA-C.  Signed, Marlyse Single, PA-C

## 2024-04-12 ENCOUNTER — Encounter: Payer: Self-pay | Admitting: Physician Assistant

## 2024-04-12 ENCOUNTER — Ambulatory Visit: Attending: Physician Assistant | Admitting: Physician Assistant

## 2024-04-12 VITALS — BP 114/70 | HR 67 | Ht 71.0 in | Wt 180.6 lb

## 2024-04-12 DIAGNOSIS — I25119 Atherosclerotic heart disease of native coronary artery with unspecified angina pectoris: Secondary | ICD-10-CM

## 2024-04-12 DIAGNOSIS — I10 Essential (primary) hypertension: Secondary | ICD-10-CM

## 2024-04-12 DIAGNOSIS — E7800 Pure hypercholesterolemia, unspecified: Secondary | ICD-10-CM

## 2024-04-12 MED ORDER — METOPROLOL SUCCINATE ER 25 MG PO TB24: 25.0000 mg | ORAL_TABLET | Freq: Every evening | ORAL | Status: DC

## 2024-04-12 NOTE — Assessment & Plan Note (Signed)
 Hold Crestor  for 3 to 4 weeks as noted.  After resuming, if symptoms recur, we will consider alternative therapies such as another statin, ezetimibe or referral to lipid clinic for PCSK9 inhibitor.

## 2024-04-12 NOTE — Assessment & Plan Note (Signed)
 CCTA in January 2024 with hemodynamically significant distal LCx stenosis by FFR.  He has been managed medically.  He has significant fatigue likely related to beta-blocker therapy.  We discussed options for adjusting his medical therapy to see if we can alleviate some of his side effects.  We discussed changing his beta-blocker to a hydrophilic form to see if it causes less side effects (atenolol).  With shared decision making, we decided to proceed with decreasing his Toprol  XL to 25 mg in the evening to see if this alleviates his symptoms.  We also decided to hold his Crestor  for a few weeks to see if this improves his symptoms. -He cannot take aspirin  secondary to history of peptic ulcer disease and GI bleeding -Continue Imdur  30 mg daily -Change Toprol -XL to 25 mg every afternoon - Hold Crestor  for 3 to 4 weeks

## 2024-04-12 NOTE — Patient Instructions (Signed)
 Medication Instructions:  Your physician has recommended you make the following change in your medication:   CHANGE the Metoprolol  to Toprol  XL 25 mg taking 1 daily   *If you need a refill on your cardiac medications before your next appointment, please call your pharmacy*  Lab Work: None ordered  If you have labs (blood work) drawn today and your tests are completely normal, you will receive your results only by: MyChart Message (if you have MyChart) OR A paper copy in the mail If you have any lab test that is abnormal or we need to change your treatment, we will call you to review the results.  Testing/Procedures: None ordered  Follow-Up: At [facility], you and your health needs are our priority.  As part of our continuing mission to provide you with exceptional heart care, our providers are all part of one team.  This team includes your primary Cardiologist (physician) and Advanced Practice Providers or APPs (Physician Assistants and Nurse Practitioners) who all work together to provide you with the care you need, when you need it.  Your next appointment:   3 month(s)  Provider:   Marlyse Single, PA-C          We recommend signing up for the patient portal called "MyChart".  Sign up information is provided on this After Visit Summary.  MyChart is used to connect with patients for Virtual Visits (Telemedicine).  Patients are able to view lab/test results, encounter notes, upcoming appointments, etc.  Non-urgent messages can be sent to your provider as well.   To learn more about what you can do with MyChart, go to [email or web address].   Other Instructions Hold the Crestor  3-4 weeks.  If symptoms still present, restart and take it in the evening

## 2024-04-28 DIAGNOSIS — N182 Chronic kidney disease, stage 2 (mild): Secondary | ICD-10-CM | POA: Diagnosis not present

## 2024-04-28 DIAGNOSIS — Z0000 Encounter for general adult medical examination without abnormal findings: Secondary | ICD-10-CM | POA: Diagnosis not present

## 2024-04-28 DIAGNOSIS — I350 Nonrheumatic aortic (valve) stenosis: Secondary | ICD-10-CM | POA: Diagnosis not present

## 2024-04-28 DIAGNOSIS — E785 Hyperlipidemia, unspecified: Secondary | ICD-10-CM | POA: Diagnosis not present

## 2024-04-28 DIAGNOSIS — R0989 Other specified symptoms and signs involving the circulatory and respiratory systems: Secondary | ICD-10-CM | POA: Diagnosis not present

## 2024-04-28 DIAGNOSIS — R7303 Prediabetes: Secondary | ICD-10-CM | POA: Diagnosis not present

## 2024-04-28 DIAGNOSIS — J45909 Unspecified asthma, uncomplicated: Secondary | ICD-10-CM | POA: Diagnosis not present

## 2024-04-28 DIAGNOSIS — I129 Hypertensive chronic kidney disease with stage 1 through stage 4 chronic kidney disease, or unspecified chronic kidney disease: Secondary | ICD-10-CM | POA: Diagnosis not present

## 2024-04-28 DIAGNOSIS — R931 Abnormal findings on diagnostic imaging of heart and coronary circulation: Secondary | ICD-10-CM | POA: Diagnosis not present

## 2024-04-28 DIAGNOSIS — R011 Cardiac murmur, unspecified: Secondary | ICD-10-CM | POA: Diagnosis not present

## 2024-04-28 DIAGNOSIS — K219 Gastro-esophageal reflux disease without esophagitis: Secondary | ICD-10-CM | POA: Diagnosis not present

## 2024-04-28 DIAGNOSIS — H938X1 Other specified disorders of right ear: Secondary | ICD-10-CM | POA: Diagnosis not present

## 2024-06-08 DIAGNOSIS — L821 Other seborrheic keratosis: Secondary | ICD-10-CM | POA: Diagnosis not present

## 2024-06-08 DIAGNOSIS — D2272 Melanocytic nevi of left lower limb, including hip: Secondary | ICD-10-CM | POA: Diagnosis not present

## 2024-06-08 DIAGNOSIS — D225 Melanocytic nevi of trunk: Secondary | ICD-10-CM | POA: Diagnosis not present

## 2024-06-08 DIAGNOSIS — L570 Actinic keratosis: Secondary | ICD-10-CM | POA: Diagnosis not present

## 2024-06-08 DIAGNOSIS — L218 Other seborrheic dermatitis: Secondary | ICD-10-CM | POA: Diagnosis not present

## 2024-06-08 DIAGNOSIS — D1801 Hemangioma of skin and subcutaneous tissue: Secondary | ICD-10-CM | POA: Diagnosis not present

## 2024-06-08 DIAGNOSIS — L7211 Pilar cyst: Secondary | ICD-10-CM | POA: Diagnosis not present

## 2024-06-08 DIAGNOSIS — L853 Xerosis cutis: Secondary | ICD-10-CM | POA: Diagnosis not present

## 2024-07-02 ENCOUNTER — Other Ambulatory Visit: Payer: Self-pay | Admitting: Urology

## 2024-07-02 DIAGNOSIS — R3912 Poor urinary stream: Secondary | ICD-10-CM

## 2024-07-06 DIAGNOSIS — M9903 Segmental and somatic dysfunction of lumbar region: Secondary | ICD-10-CM | POA: Diagnosis not present

## 2024-07-06 DIAGNOSIS — M9905 Segmental and somatic dysfunction of pelvic region: Secondary | ICD-10-CM | POA: Diagnosis not present

## 2024-07-06 DIAGNOSIS — M9904 Segmental and somatic dysfunction of sacral region: Secondary | ICD-10-CM | POA: Diagnosis not present

## 2024-07-06 DIAGNOSIS — M5136 Other intervertebral disc degeneration, lumbar region with discogenic back pain only: Secondary | ICD-10-CM | POA: Diagnosis not present

## 2024-07-13 NOTE — Progress Notes (Unsigned)
 Follow Up Note  RE: [name] [name] MRN: [id number] DOB: 12-Apr-1946 Date of Office Visit: 07/14/2024  Referring provider: Corlis Pagan, NP Primary care provider: Corlis Pagan, NP  Chief Complaint: No chief complaint on file.  History of Present Illness: I had the pleasure of seeing [name] [name] for a follow up visit at the [facility] on 07/14/2024. He is a 78 y.o. male, who is being followed for asthma, allergic rhinitis. His previous allergy office visit was on 01/14/2024 with Dr. Luke. Today is a regular follow up visit.  Discussed the use of AI scribe software for clinical note transcription with the patient, who gave verbal consent to proceed.  History of Present Illness            ***  Assessment and Plan: [name] [name] is a 78 y.o. male with: Mild persistent asthma without complication Past history - triggers include humidity.  Interim history - off Flovent  for a couple of months with no significant change in symptoms. Occasional use of Albuterol  for wheezing episodes. Continues on Montelukast . Today's spirometry was unremarkable. During respiratory infections/flares:  Start Advair 115mcg 2 puffs twice a day with spacer and rinse mouth afterwards for 1-2 weeks until your breathing symptoms return to baseline.  If not covered let us  know. May use albuterol  rescue inhaler 2 puffs every 4 to 6 hours as needed for shortness of breath, chest tightness, coughing, and wheezing. May use albuterol  rescue inhaler 2 puffs 5 to 15 minutes prior to strenuous physical activities. Monitor frequency of use - if you need to use it more than twice per week on a consistent basis let us  know.  Get spirometry at next visit.   Allergic rhinitis due to dust mite Allergic rhinitis due to mold Allergy to cockroaches Past history - 2018 skin testing positive to mold, cockroach and dust mites. Not interested in AIT. Interim history - Persistent symptoms of rhinitis despite use of  Fluticasone  and Azelastine  nasal sprays. Saline rinses help the most.  Continue environmental control measures. Continue Singulair  (montelukast ) 10mg  daily at night. Use over the counter antihistamines such as Zyrtec (cetirizine), Claritin (loratadine), Allegra (fexofenadine), or Xyzal  (levocetirizine) daily as needed. May switch antihistamines every few months. Use Flonase  (fluticasone ) nasal spray 1-2 sprays per nostril once a day as needed for nasal congestion.  Use azelastine  nasal spray 1-2 sprays per nostril twice a day as needed for runny nose/drainage. Nasal saline spray (i.e., Simply Saline) or nasal saline lavage (i.e., NeilMed) is recommended as needed and prior to medicated nasal sprays. Encouraged daily use.  Assessment and Plan              No follow-ups on file.  No orders of the defined types were placed in this encounter.  Lab Orders  No laboratory test(s) ordered today    Diagnostics: Spirometry:  Tracings reviewed. His effort: {Blank single:19197::Good reproducible efforts.,It was hard to get consistent efforts and there is a question as to whether this reflects a maximal maneuver.,Poor effort, data can not be interpreted.} FVC: ***L FEV1: ***L, ***% predicted FEV1/FVC ratio: ***% Interpretation: {Blank single:19197::Spirometry consistent with mild obstructive disease,Spirometry consistent with moderate obstructive disease,Spirometry consistent with severe obstructive disease,Spirometry consistent with possible restrictive disease,Spirometry consistent with mixed obstructive and restrictive disease,Spirometry uninterpretable due to technique,Spirometry consistent with normal pattern,No overt abnormalities noted given today's efforts}.  Please see scanned spirometry results for details.  Skin Testing: {Blank single:19197::Select foods,Environmental allergy panel,Environmental allergy panel and select foods,Food allergy  panel,None,Deferred  due to recent antihistamines use}. *** Results discussed with patient/family.   Medication List:  Current Outpatient Medications  Medication Sig Dispense Refill   acetaminophen  (TYLENOL ) 500 MG tablet Take 1,000 mg by mouth at bedtime as needed for moderate pain (pain score 4-6) or mild pain (pain score 1-3) (pain).     ADVAIR HFA 115-21 MCG/ACT inhaler Inhale 2 puffs into the lungs 2 (two) times daily. 1 each 5   albuterol  (VENTOLIN  HFA) 108 (90 Base) MCG/ACT inhaler INHALE 1-2 PUFFS BY MOUTH EVERY 6 HOURS AS NEEDED FOR WHEEZE OR SHORTNESS OF BREATH 18 each 1   Azelastine  HCl 137 MCG/SPRAY SOLN USE 1 OR 2 SPRAYS INTO EACH NOSTRIL TWICE DAILY AS NEEDED FOR RUNNY NOSE/DRAINAGE. 30 mL 5   diphenhydrAMINE  (BENADRYL ) 25 MG tablet Take 25 mg by mouth at bedtime as needed for allergies.     fluticasone  (FLONASE ) 50 MCG/ACT nasal spray USE 2 SPRAYS INTO EACH NOSTRIL ONCE A DAY AS NEEDED FOR STUFFY NOSE 48 mL 3   hydrochlorothiazide  (MICROZIDE ) 12.5 MG capsule Take 12.5 mg by mouth every other day.     hydrOXYzine (ATARAX) 25 MG tablet Take 25 mg by mouth daily as needed for anxiety.     isosorbide  mononitrate (IMDUR ) 30 MG 24 hr tablet TAKE 1 TABLET BY MOUTH EVERY DAY 90 tablet 3   levocetirizine (XYZAL ) 5 MG tablet TAKE 1 TABLET BY MOUTH ONCE DAILY AS NEEDED FOR RUNNY NOSE (Patient taking differently: Take 5 mg by mouth every evening.) 90 tablet 1   metoprolol  succinate (TOPROL -XL) 25 MG 24 hr tablet Take 1 tablet (25 mg total) by mouth every evening.     montelukast  (SINGULAIR ) 10 MG tablet TAKE 1 TABLET BY MOUTH EVERYDAY AT BEDTIME 90 tablet 3   pantoprazole  (PROTONIX ) 40 MG tablet TAKE 1 TABLET BY MOUTH EVERY DAY (Patient taking differently: Take 40 mg by mouth daily as needed (flare-up).) 30 tablet 6   promethazine -dextromethorphan (PROMETHAZINE -DM) 6.25-15 MG/5ML syrup Take 5 mLs by mouth 4 (four) times daily as needed. 100 mL 0   rosuvastatin  (CRESTOR ) 10 MG tablet TAKE  1 TABLET BY MOUTH EVERY DAY 90 tablet 3   tamsulosin  (FLOMAX ) 0.4 MG CAPS capsule TAKE ONE CAPSULE BY MOUTH DAILY IN THE MORNING AND ONE CAPSULE AT BEDTIME 180 capsule 0   triamcinolone cream (KENALOG) 0.1 % Apply 1 Application topically daily as needed (itching /dry skin).     No current facility-administered medications for this visit.   Allergies: Allergies  Allergen Reactions   Percocet [Oxycodone -Acetaminophen ] Nausea And Vomiting   Sulfacetamide Sodium Hives   I reviewed his past medical history, social history, family history, and environmental history and no significant changes have been reported from his previous visit.  Review of Systems  Constitutional:  Negative for appetite change, chills, fever and unexpected weight change.  HENT:  Positive for postnasal drip and rhinorrhea. Negative for congestion.   Eyes:  Negative for itching.  Respiratory:  Negative for cough, chest tightness, shortness of breath and wheezing.   Cardiovascular:  Negative for chest pain.  Gastrointestinal:  Negative for abdominal pain.  Genitourinary:  Negative for difficulty urinating.  Skin:  Negative for rash.  Allergic/Immunologic: Positive for environmental allergies.  Neurological:  Negative for headaches.    Objective: There were no vitals taken for this visit. There is no height or weight on file to calculate BMI. Physical Exam Vitals and nursing note reviewed.  Constitutional:      Appearance: Normal appearance. He is well-developed.  HENT:     Head: Normocephalic and atraumatic.     Right Ear: Tympanic membrane and external ear normal.     Left Ear: Tympanic membrane and external ear normal.     Nose: Nose normal.     Mouth/Throat:     Mouth: Mucous membranes are moist.     Pharynx: Oropharynx is clear.  Eyes:     Conjunctiva/sclera: Conjunctivae normal.  Cardiovascular:     Rate and Rhythm: Normal rate and regular rhythm.     Heart sounds: Normal heart sounds. No murmur  heard.    No friction rub. No gallop.  Pulmonary:     Effort: Pulmonary effort is normal.     Breath sounds: Normal breath sounds. No wheezing, rhonchi or rales.  Musculoskeletal:     Cervical back: Neck supple.  Skin:    General: Skin is warm.     Findings: No rash.  Neurological:     Mental Status: He is alert and oriented to person, place, and time.  Psychiatric:        Behavior: Behavior normal.    Previous notes and tests were reviewed. The plan was reviewed with the patient/family, and all questions/concerned were addressed.  It was my pleasure to see [name] [name] today and participate in his care. Please feel free to contact me with any questions or concerns.  Sincerely,  Orlan Cramp, DO Allergy & Immunology  [facility]  [place] office: [phone] [facility] office: [phone]

## 2024-07-14 ENCOUNTER — Encounter: Payer: Self-pay | Admitting: Allergy

## 2024-07-14 ENCOUNTER — Other Ambulatory Visit: Payer: Self-pay

## 2024-07-14 ENCOUNTER — Ambulatory Visit (INDEPENDENT_AMBULATORY_CARE_PROVIDER_SITE_OTHER): Payer: PPO | Admitting: Allergy

## 2024-07-14 VITALS — BP 112/72 | HR 64 | Temp 98.0°F | Resp 16

## 2024-07-14 DIAGNOSIS — J4530 Mild persistent asthma, uncomplicated: Secondary | ICD-10-CM | POA: Diagnosis not present

## 2024-07-14 DIAGNOSIS — J3089 Other allergic rhinitis: Secondary | ICD-10-CM | POA: Diagnosis not present

## 2024-07-14 DIAGNOSIS — M5136 Other intervertebral disc degeneration, lumbar region with discogenic back pain only: Secondary | ICD-10-CM | POA: Diagnosis not present

## 2024-07-14 DIAGNOSIS — Z91038 Other insect allergy status: Secondary | ICD-10-CM | POA: Diagnosis not present

## 2024-07-14 DIAGNOSIS — M9904 Segmental and somatic dysfunction of sacral region: Secondary | ICD-10-CM | POA: Diagnosis not present

## 2024-07-14 DIAGNOSIS — M9903 Segmental and somatic dysfunction of lumbar region: Secondary | ICD-10-CM | POA: Diagnosis not present

## 2024-07-14 DIAGNOSIS — M9905 Segmental and somatic dysfunction of pelvic region: Secondary | ICD-10-CM | POA: Diagnosis not present

## 2024-07-14 MED ORDER — MONTELUKAST SODIUM 10 MG PO TABS
10.0000 mg | ORAL_TABLET | Freq: Every day | ORAL | 3 refills | Status: AC
Start: 2024-07-14 — End: ?

## 2024-07-14 NOTE — Patient Instructions (Addendum)
 Mild persistent asthma Normal spirometry today. During respiratory infections/flares:  Start Advair 115mcg 2 puffs twice a day with spacer and rinse mouth afterwards for 1-2 weeks until your breathing symptoms return to baseline.  May use albuterol  rescue inhaler 2 puffs every 4 to 6 hours as needed for shortness of breath, chest tightness, coughing, and wheezing. May use albuterol  rescue inhaler 2 puffs 5 to 15 minutes prior to strenuous physical activities. Monitor frequency of use - if you need to use it more than twice per week on a consistent basis let us  know.  Breathing control goals:  Full participation in all desired activities (may need albuterol  before activity) Albuterol  use two times or less a week on average (not counting use with activity) Cough interfering with sleep two times or less a month Oral steroids no more than once a year No hospitalizations   Perennial allergic rhinitis 2018 skin testing positive to mold, cockroach and dust mites. Continue environmental control measures. Continue Singulair  (montelukast ) 10mg  daily at night. Use over the counter antihistamines such as Zyrtec (cetirizine), Claritin (loratadine), Allegra (fexofenadine), or Xyzal  (levocetirizine) daily as needed. May switch antihistamines every few months. Use Flonase  (fluticasone ) nasal spray 1-2 sprays per nostril once a day as needed for nasal congestion.  Use azelastine  nasal spray 1-2 sprays per nostril twice a day as needed for runny nose/drainage. Nasal saline spray (i.e., Simply Saline) or nasal saline lavage (i.e., NeilMed) is recommended as needed and prior to medicated nasal sprays.  Return in about 6 months (around 01/14/2025). Or sooner if needed.

## 2024-07-17 NOTE — Assessment & Plan Note (Signed)
 CCTA in January 2024 with hemodynamically significant distal LCx stenosis by FFR.  He has been managed medically.  At last visit, I changed his Metoprolol  succinate to at bedtime and held his Rosuvastatin  due to side effects of fatigue. Well-managed with rare episodes of chest discomfort. Medication side effects resolved with previous adjustments. Not on aspirin  due to peptic ulcer disease and prior GI bleeding. Emphasized aggressive cholesterol management to stabilize plaques and reduce myocardial infarction risk. - Continue isosorbide  mononitrate 30 mg daily - Continue metoprolol  succinate 25 mg at bedtime - Follow up in six months

## 2024-07-17 NOTE — Progress Notes (Signed)
 OFFICE NOTE:    Date:  07/19/2024  ID:  [name] [name], DOB 16-Jan-1946, MRN [id number] PCP: Corlis Pagan, NP  [facility] Providers Cardiologist:  Oneil Parchment, MD       Coronary artery disease CCTA 12/12/2022: CAC score 219 (49th percentile); RCA proximal-mid 70-99 (FFR 0.8), mid-distal 25-49; LM <25; LAD proximal and mid 50-69 (FFR 0.85), D1 25-49; LCx proximal 25-49, distal 70-99 (FFR 0.69) >> hemodynamically significant stenosis in distal LCx according to FFR-med Rx given distal vessel disease Hypertension Mild tricuspid regurgitation Mitral regurgitation TTE 08/15/22: EF 55-60, impaired relaxation, mild-moderate LVH trace AI, AV sclerosis, trace MR, trace to mild TR, trace PI FHx of CAD  Chronic kidney disease Hypertension Hyperlipidemia Prediabetes       Discussed the use of AI scribe software for clinical note transcription with the patient, who gave verbal consent to proceed. History of Present Illness [name] [name] is a 78 y.o. male who returns for follow up of CAD. He was last seen in 03/2024. He noted significant fatigue. I changed his Metoprolol  succinate to at bedtime and held his Rosuvastatin  for a few weeks to manage his symptoms.   He feels 'much better' overall, with good and not so good days. He experiences dizziness and fatigue on days he takes hydrochlorothiazide , especially if he does not drink enough water. He notes rare episodes of chest discomfort, which he manages by resting when tired. He has not been able to exercise as much recently due to caring for his wife after her eye surgery. No significant issues with swelling. His shortness of breath is primarily due to asthma, which is stable.     ROS-See HPI    Studies Reviewed:      Results LABS Total cholesterol: 129 (04/05/2024) HDL: 49 (04/05/2024) LDL: 68 (04/05/2024) Triglycerides: 56 (04/05/2024) Creatinine: 1.17 (12/08/2023) Hemoglobin: 14.5 (12/08/2023)          Physical Exam:  VS:   BP 124/66 (BP Location: Right Arm, Patient Position: Sitting, Cuff Size: Normal)   Pulse 66   Ht 5' 10 (1.778 m)   Wt 176 lb (79.8 kg)   SpO2 96%   BMI 25.25 kg/m        Wt Readings from Last 3 Encounters:  07/19/24 176 lb (79.8 kg)  04/12/24 180 lb 9.6 oz (81.9 kg)  12/22/23 183 lb (83 kg)    Constitutional:      Appearance: Healthy appearance. Not in distress.  Neck:     Vascular: JVD normal.  Pulmonary:     Breath sounds: Normal breath sounds. No wheezing. No rales.  Cardiovascular:     Normal rate. Regular rhythm.     Murmurs: There is no murmur.  Edema:    Peripheral edema absent.  Abdominal:     Palpations: Abdomen is soft.       Assessment and Plan:    Assessment & Plan Coronary artery disease involving native coronary artery of native heart with angina pectoris (HCC) CCTA in January 2024 with hemodynamically significant distal LCx stenosis by FFR.  He has been managed medically.  At last visit, I changed his Metoprolol  succinate to at bedtime and held his Rosuvastatin  due to side effects of fatigue. Well-managed with rare episodes of chest discomfort. Medication side effects resolved with previous adjustments. Not on aspirin  due to peptic ulcer disease and prior GI bleeding. Emphasized aggressive cholesterol management to stabilize plaques and reduce myocardial infarction risk. - Continue isosorbide  mononitrate 30 mg daily - Continue  metoprolol  succinate 25 mg at bedtime - Follow up in six months Primary hypertension Blood pressure is controlled. Significant dizziness and fatigue occur with hydrochlorothiazide . - Stop hydrochlorothiazide  - Monitor blood pressure at home - Continue isosorbide  mononitrate 30 mg daily, metoprolol  succinate 25 mg daily - Consider increasing isosorbide  if blood pressure runs above target Pure hypercholesterolemia Symptoms of fatigue resolved off rosuvastatin .  We discussed resuming rosuvastatin  to assess tolerance.   - Resume  rosuvastatin  10 mg every Monday, Wednesday, and Friday - Report response in a few weeks - If tolerated, order follow-up labs for lipids and ALT - If not tolerated, refer to PharmD for PCSK9 inhibitor     Dispo:  Return in about 6 months (around 01/19/2025) for Routine Follow Up, w/ Dr. Jeffrie, or Glendia Ferrier, PA-C.  Signed, Glendia Ferrier, PA-C

## 2024-07-19 ENCOUNTER — Ambulatory Visit: Attending: Physician Assistant | Admitting: Physician Assistant

## 2024-07-19 ENCOUNTER — Encounter: Payer: Self-pay | Admitting: Physician Assistant

## 2024-07-19 VITALS — BP 124/66 | HR 66 | Ht 70.0 in | Wt 176.0 lb

## 2024-07-19 DIAGNOSIS — M5136 Other intervertebral disc degeneration, lumbar region with discogenic back pain only: Secondary | ICD-10-CM | POA: Diagnosis not present

## 2024-07-19 DIAGNOSIS — I10 Essential (primary) hypertension: Secondary | ICD-10-CM

## 2024-07-19 DIAGNOSIS — M9903 Segmental and somatic dysfunction of lumbar region: Secondary | ICD-10-CM | POA: Diagnosis not present

## 2024-07-19 DIAGNOSIS — M9904 Segmental and somatic dysfunction of sacral region: Secondary | ICD-10-CM | POA: Diagnosis not present

## 2024-07-19 DIAGNOSIS — M9905 Segmental and somatic dysfunction of pelvic region: Secondary | ICD-10-CM | POA: Diagnosis not present

## 2024-07-19 DIAGNOSIS — E7800 Pure hypercholesterolemia, unspecified: Secondary | ICD-10-CM

## 2024-07-19 DIAGNOSIS — I25119 Atherosclerotic heart disease of native coronary artery with unspecified angina pectoris: Secondary | ICD-10-CM

## 2024-07-19 MED ORDER — ROSUVASTATIN CALCIUM 10 MG PO TABS: ORAL_TABLET | ORAL | Status: DC

## 2024-07-19 NOTE — Patient Instructions (Signed)
 Medication Instructions:  Your physician has recommended you make the following change in your medication:   STOP Hydrochlorothiazide    *If you need a refill on your cardiac medications before your next appointment, please call your pharmacy*  Lab Work: None ordered  If you have labs (blood work) drawn today and your tests are completely normal, you will receive your results only by: MyChart Message (if you have MyChart) OR A paper copy in the mail If you have any lab test that is abnormal or we need to change your treatment, we will call you to review the results.  Testing/Procedures: None ordered  Follow-Up: At [facility], you and your health needs are our priority.  As part of our continuing mission to provide you with exceptional heart care, our providers are all part of one team.  This team includes your primary Cardiologist (physician) and Advanced Practice Providers or APPs (Physician Assistants and Nurse Practitioners) who all work together to provide you with the care you need, when you need it.  Your next appointment:   6 month(s)  Provider:   Oneil Parchment, MD or Glendia Ferrier, PA-C          We recommend signing up for the patient portal called MyChart.  Sign up information is provided on this After Visit Summary.  MyChart is used to connect with patients for Virtual Visits (Telemedicine).  Patients are able to view lab/test results, encounter notes, upcoming appointments, etc.  Non-urgent messages can be sent to your provider as well.   To learn more about what you can do with MyChart, go to [email or web address].   Other Instructions Monitor your blood pressure.  Let us  know if it is continually running over 130/80  Try the Rosuvastatin  10 mg again in a week or so, but only take it 3 times a week.  Let me know either way if you are able to tolerate or not, that way we can arrange for further treatment.

## 2024-07-19 NOTE — Assessment & Plan Note (Signed)
 Symptoms of fatigue resolved off rosuvastatin .  We discussed resuming rosuvastatin  to assess tolerance.   - Resume rosuvastatin  10 mg every Monday, Wednesday, and Friday - Report response in a few weeks - If tolerated, order follow-up labs for lipids and ALT - If not tolerated, refer to PharmD for PCSK9 inhibitor

## 2024-07-27 DIAGNOSIS — M9904 Segmental and somatic dysfunction of sacral region: Secondary | ICD-10-CM | POA: Diagnosis not present

## 2024-07-27 DIAGNOSIS — M9903 Segmental and somatic dysfunction of lumbar region: Secondary | ICD-10-CM | POA: Diagnosis not present

## 2024-07-27 DIAGNOSIS — M5136 Other intervertebral disc degeneration, lumbar region with discogenic back pain only: Secondary | ICD-10-CM | POA: Diagnosis not present

## 2024-07-27 DIAGNOSIS — M9905 Segmental and somatic dysfunction of pelvic region: Secondary | ICD-10-CM | POA: Diagnosis not present

## 2024-08-02 DIAGNOSIS — M9905 Segmental and somatic dysfunction of pelvic region: Secondary | ICD-10-CM | POA: Diagnosis not present

## 2024-08-02 DIAGNOSIS — M5136 Other intervertebral disc degeneration, lumbar region with discogenic back pain only: Secondary | ICD-10-CM | POA: Diagnosis not present

## 2024-08-02 DIAGNOSIS — M9903 Segmental and somatic dysfunction of lumbar region: Secondary | ICD-10-CM | POA: Diagnosis not present

## 2024-08-02 DIAGNOSIS — M9904 Segmental and somatic dysfunction of sacral region: Secondary | ICD-10-CM | POA: Diagnosis not present

## 2024-08-05 DIAGNOSIS — L7211 Pilar cyst: Secondary | ICD-10-CM | POA: Diagnosis not present

## 2024-08-07 ENCOUNTER — Encounter: Payer: Self-pay | Admitting: Emergency Medicine

## 2024-08-07 ENCOUNTER — Ambulatory Visit
Admission: EM | Admit: 2024-08-07 | Discharge: 2024-08-07 | Disposition: A | Discharge status: Home / Self Care | Attending: Nurse Practitioner | Admitting: Nurse Practitioner

## 2024-08-07 DIAGNOSIS — J069 Acute upper respiratory infection, unspecified: Secondary | ICD-10-CM

## 2024-08-07 DIAGNOSIS — R062 Wheezing: Secondary | ICD-10-CM | POA: Diagnosis not present

## 2024-08-07 DIAGNOSIS — R0989 Other specified symptoms and signs involving the circulatory and respiratory systems: Secondary | ICD-10-CM

## 2024-08-07 LAB — POC SOFIA SARS ANTIGEN FIA: SARS Coronavirus 2 Ag: NEGATIVE

## 2024-08-07 MED ORDER — PROMETHAZINE-DM 6.25-15 MG/5ML PO SYRP: 5.0000 mL | ORAL_SOLUTION | Freq: Four times a day (QID) | ORAL | 0 refills | Status: DC | PRN

## 2024-08-07 MED ORDER — DEXAMETHASONE SODIUM PHOSPHATE 10 MG/ML IJ SOLN
10.0000 mg | INTRAMUSCULAR | Status: AC
  Administered 2024-08-07: 10 mg via INTRAMUSCULAR

## 2024-08-07 MED ORDER — AMOXICILLIN-POT CLAVULANATE 875-125 MG PO TABS: 1.0000 | ORAL_TABLET | Freq: Two times a day (BID) | ORAL | 0 refills | Status: AC

## 2024-08-07 MED ORDER — ALBUTEROL SULFATE HFA 108 (90 BASE) MCG/ACT IN AERS: 2.0000 | INHALATION_SPRAY | Freq: Four times a day (QID) | RESPIRATORY_TRACT | 0 refills | Status: AC | PRN

## 2024-08-07 NOTE — Discharge Instructions (Signed)
 The COVID test was negative. You have been given an injection of Decadron  10 mg today. Take medication as prescribed.  Continue your current allergy regimen. Increase fluids and allow for plenty of rest. May use normal saline nasal spray throughout the day for nasal congestion and runny nose. For your cough, recommend use of a humidifier in your bedroom at nighttime during sleep and to sleep elevated on pillows while symptoms persist. If symptoms fail to improve with this treatment, you may follow-up in this clinic or with your primary care physician for further evaluation. Follow-up as needed.

## 2024-08-07 NOTE — ED Provider Notes (Signed)
 [facility]    CSN: [id number] Arrival date & time: 08/07/24  0946      History   Chief Complaint No chief complaint on file.   HPI [name] [name] is a 78 y.o. male.   The history is provided by the patient.   Patient with a 2-day history of nasal congestion, cough, runny nose, and headache.  Patient denies fever, chills, ear pain, ear drainage, difficulty breathing, abdominal pain, nausea, vomiting, diarrhea, or rash.  Patient states he was in his attic cleaning and thinks that his symptoms may have been exacerbated by this.  Patient denies any recent close sick contacts.  States that he has been taking Mucinex for his symptoms.  Per review of chart, patient with underlying history of asthma.  He also states that he takes Singulair , Flonase , and Xyzal  for his allergies.  Past Medical History:  Diagnosis Date   Arthritis    Asthma    uses inhaler daily and as needed   Blood transfusion 2012   surgery- esophageal bleeding secondary to hiatal hernia   CAD (coronary artery disease) 04/11/2024   CCTA 12/12/2022: CAC score 219 (49th percentile); RCA proximal-mid 70-99 (FFR 0.8), mid-distal 25-49; LM <25; LAD proximal and mid 50-69 (FFR 0.85), D1 25-49; LCx proximal 25-49, distal 70-99 (FFR 0.69) >> hemodynamically significant stenosis in distal LCx according to FFR-med Rx given distal vessel disease    CKD (chronic kidney disease)    Constipation    Coronary artery disease    Diverticulitis    Diverticulosis    Eczema    Esophageal stricture    GERD (gastroesophageal reflux disease)    HBP (high blood pressure)    on meds   Heart murmur    History of kidney stones    HTN (hypertension)    Hyperlipemia    on meds   Kidney stones    hx of   Pre-diabetes    Seasonal allergies    Trouble swallowing    Ulcer     Patient Active Problem List   Diagnosis Date Noted   CAD (coronary artery disease) 04/11/2024   Asthma 07/01/2022   Benign prostatic hyperplasia  without lower urinary tract symptoms 07/01/2022   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   Chronic kidney disease, 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   History of COVID-19 07/01/2022   History of urinary stone 07/01/2022   Hyperlipidemia 07/01/2022   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   Solitary pulmonary nodule 07/01/2022   Tobacco user 07/01/2022   Vitamin D deficiency 07/01/2022   Pain in lower limb 11/09/2021   Pain of left lower leg 11/09/2021   Gastroesophageal reflux disease 07/30/2019   Low back pain 07/03/2018   Pain in right knee 07/03/2018   Coughing/wheezing 06/15/2018   Perennial allergic rhinitis 02/24/2017   Mild persistent asthma without complication 02/24/2017   Subacute cough 02/24/2017   H/O: GI bleed 08/14/2012   Ulcer of esophagus with bleeding 08/14/2012   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 Authorizing Provider  albuterol  (VENTOLIN  HFA) 108 (90 Base) MCG/ACT inhaler Inhale 2 puffs into the lungs every 6 (six) hours as needed. 08/07/24  Yes Leath-Warren, Etta PARAS, NP  amoxicillin -clavulanate (AUGMENTIN ) 875-125 MG tablet Take 1 tablet by mouth every 12 (twelve) hours. 08/07/24  Yes Leath-Warren, Etta PARAS, NP  promethazine -dextromethorphan (PROMETHAZINE -DM) 6.25-15 MG/5ML syrup Take 5 mLs by mouth 4 (four) times daily as needed.  08/07/24  Yes Leath-Warren, Etta PARAS, NP  acetaminophen  (TYLENOL ) 500 MG tablet Take 1,000 mg by mouth at bedtime as needed for moderate pain (pain score 4-6) or mild pain (pain score 1-3) (pain).    [provider]  ADVAIR [facility] 115-21 MCG/ACT inhaler Inhale 2 puffs into the lungs 2 (two) times daily. 01/14/24   Luke Orlan HERO, DO  Azelastine  HCl 137 MCG/SPRAY SOLN USE 1 OR 2 SPRAYS INTO EACH NOSTRIL TWICE DAILY AS NEEDED FOR RUNNY NOSE/DRAINAGE. 07/01/22   Cheryl Reusing, FNP  diphenhydrAMINE  (BENADRYL ) 25 MG tablet Take 25 mg by mouth at bedtime as needed for allergies.    [provider]  fluticasone  (FLONASE ) 50 MCG/ACT nasal spray USE 2 SPRAYS INTO EACH NOSTRIL ONCE A DAY AS NEEDED FOR STUFFY NOSE 02/23/24   Cheryl Reusing, FNP  hydrOXYzine (ATARAX) 25 MG tablet Take 25 mg by mouth daily as needed for anxiety.    [provider]  isosorbide  mononitrate (IMDUR ) 30 MG 24 hr tablet TAKE 1 TABLET BY MOUTH EVERY DAY 11/24/23   Jeffrie Oneil BROCKS, MD  levocetirizine (XYZAL ) 5 MG tablet TAKE 1 TABLET BY MOUTH ONCE DAILY AS NEEDED FOR RUNNY NOSE 03/04/23   Ambs, Arlean HERO, FNP  metoprolol  succinate (TOPROL -XL) 25 MG 24 hr tablet Take 1 tablet (25 mg total) by mouth every evening. 04/12/24   Lelon Hamilton T, PA-C  montelukast  (SINGULAIR ) 10 MG tablet Take 1 tablet (10 mg total) by mouth at bedtime. 07/14/24   Luke Orlan HERO, DO  pantoprazole  (PROTONIX ) 40 MG tablet TAKE 1 TABLET BY MOUTH EVERY DAY 09/15/17   Abran Norleen SAILOR, MD  rosuvastatin  (CRESTOR ) 10 MG tablet Take 1 tablet (10 mg) every Mon, Wed, Fri only. 07/19/24   Lelon Hamilton T, PA-C  tamsulosin  (FLOMAX ) 0.4 MG CAPS capsule TAKE ONE CAPSULE BY MOUTH DAILY IN THE MORNING AND ONE CAPSULE AT BEDTIME 07/07/24   McKenzie, Belvie CROME, MD  triamcinolone cream (KENALOG) 0.1 % Apply 1 Application topically daily as needed (itching /dry skin). 02/23/20   [provider]  budesonide  (PULMICORT  FLEXHALER) 180 MCG/ACT inhaler Inhale 2 puffs into  the lungs 2 (two) times daily. 09/27/19 09/27/19  Bobbitt, Elgin Pepper, MD    Family History Family History  Problem Relation Age of Onset   Heart disease Father 25       heart attack   Colon cancer Neg Hx    Allergic rhinitis Neg Hx    Angioedema Neg Hx    Asthma Neg Hx    Eczema Neg Hx    Immunodeficiency Neg Hx    Urticaria Neg Hx    Colon polyps Neg Hx    Esophageal cancer Neg Hx    Rectal cancer Neg Hx    Stomach cancer Neg Hx     Social History Social History   Tobacco Use   Smoking status: Former    Current packs/day: 0.00  Types: Cigarettes    Quit date: 11/25/1981    Years since quitting: 42.7    Passive exposure: Never   Smokeless tobacco: Never  Vaping Use   Vaping status: Never Used  Substance Use Topics   Alcohol use: Not Currently    Alcohol/week: 0.0 - 3.0 standard drinks of alcohol   Drug use: No     Allergies   Percocet [oxycodone -acetaminophen ] and Sulfacetamide sodium   Review of Systems Review of Systems Per HPI  Physical Exam Triage Vital Signs ED Triage Vitals  Encounter Vitals Group     BP 08/07/24 0958 (!) 149/71     Girls Systolic BP Percentile --      Girls Diastolic BP Percentile --      Boys Systolic BP Percentile --      Boys Diastolic BP Percentile --      Pulse Rate 08/07/24 0958 67     Resp 08/07/24 0958 18     Temp 08/07/24 0958 97.7 F (36.5 C)     Temp Source 08/07/24 0958 Oral     SpO2 08/07/24 0958 93 %     Weight --      Height --      Head Circumference --      Peak Flow --      Pain Score 08/07/24 1000 0     Pain Loc --      Pain Education --      Exclude from Growth Chart --    No data found.  Updated Vital Signs BP (!) 149/71 (BP Location: Right Arm)   Pulse 67   Temp 97.7 F (36.5 C) (Oral)   Resp 18   SpO2 93%   Visual Acuity Right Eye Distance:   Left Eye Distance:   Bilateral Distance:    Right Eye Near:   Left Eye Near:    Bilateral Near:     Physical Exam Vitals and nursing note  reviewed.  Constitutional:      General: He is not in acute distress.    Appearance: Normal appearance.  HENT:     Head: Normocephalic.     Right Ear: Tympanic membrane, ear canal and external ear normal.     Left Ear: Tympanic membrane, ear canal and external ear normal.     Nose: Congestion present.     Mouth/Throat:     Mouth: Mucous membranes are moist.  Eyes:     Extraocular Movements: Extraocular movements intact.     Conjunctiva/sclera: Conjunctivae normal.     Pupils: Pupils are equal, round, and reactive to light.  Cardiovascular:     Rate and Rhythm: Normal rate and regular rhythm.     Pulses: Normal pulses.     Heart sounds: Normal heart sounds.  Pulmonary:     Effort: Pulmonary effort is normal.     Breath sounds: Wheezing (Posterior left lower lobe) present.  Abdominal:     General: Bowel sounds are normal.     Palpations: Abdomen is soft.  Musculoskeletal:     Cervical back: Normal range of motion.  Skin:    General: Skin is warm and dry.  Neurological:     General: No focal deficit present.     Mental Status: He is alert and oriented to person, place, and time.  Psychiatric:        Mood and Affect: Mood normal.        Behavior: Behavior normal.      UC Treatments / Results  Labs (all labs ordered are listed, but only abnormal results are displayed) Labs Reviewed  POC SOFIA SARS ANTIGEN FIA    EKG   Radiology No results found.  Procedures Procedures (including critical care time)  Medications Ordered in UC Medications  dexamethasone  (DECADRON ) injection 10 mg (has no administration in time range)    Initial Impression / Assessment and Plan / UC Course  I have reviewed the triage vital signs and the nursing notes.  Pertinent labs & imaging results that were available during my care of the patient were reviewed by me and considered in my medical decision making (see chart for details).  COVID test was negative.  On initial exam, patient  with expiratory wheezing in the left lower lobe.  Decadron  10 mg IM administered for wheezing.  Patient is currently taking several allergy medications with minimal relief for his symptoms.  Patient advised to continue current treatment, will provide patient prescription for Augmentin  875/125 mg in the event that his symptoms fail to improve.  Albuterol  inhaler also provided for wheezing or shortness of breath.  Supportive care recommendations were provided and discussed with the patient to include fluids, rest, over-the-counter analgesics, normal saline nasal spray, and use of a humidifier during sleep.  Discussion with patient regarding follow-up.  Patient was in agreement with this plan of care and verbalizes understanding.  All questions were answered.  Patient stable for discharge.   Final Clinical Impressions(s) / UC Diagnoses   Final diagnoses:  Upper respiratory symptom  Acute upper respiratory infection  Wheezing     Discharge Instructions      The COVID test was negative. You have been given an injection of Decadron  10 mg today. Take medication as prescribed.  Continue your current allergy regimen. Increase fluids and allow for plenty of rest. May use normal saline nasal spray throughout the day for nasal congestion and runny nose. For your cough, recommend use of a humidifier in your bedroom at nighttime during sleep and to sleep elevated on pillows while symptoms persist. If symptoms fail to improve with this treatment, you may follow-up in this clinic or with your primary care physician for further evaluation. Follow-up as needed.     ED Prescriptions     Medication Sig Dispense Auth. Provider   albuterol  (VENTOLIN  HFA) 108 (90 Base) MCG/ACT inhaler Inhale 2 puffs into the lungs every 6 (six) hours as needed. 8 g Leath-Warren, Etta PARAS, NP   amoxicillin -clavulanate (AUGMENTIN ) 875-125 MG tablet Take 1 tablet by mouth every 12 (twelve) hours. 14 tablet Leath-Warren,  Etta PARAS, NP   promethazine -dextromethorphan (PROMETHAZINE -DM) 6.25-15 MG/5ML syrup Take 5 mLs by mouth 4 (four) times daily as needed. 118 mL Leath-Warren, Etta PARAS, NP      PDMP not reviewed this encounter.   Gilmer Etta PARAS, NP 08/07/24 1047

## 2024-08-07 NOTE — ED Triage Notes (Addendum)
 After working in attic developed nasal congestion, cough, runny nose x 2 days.  Headache off and on.  Has been taking mucinex with little relief.  Does not want covid test

## 2024-08-30 DIAGNOSIS — M5136 Other intervertebral disc degeneration, lumbar region with discogenic back pain only: Secondary | ICD-10-CM | POA: Diagnosis not present

## 2024-08-30 DIAGNOSIS — M9903 Segmental and somatic dysfunction of lumbar region: Secondary | ICD-10-CM | POA: Diagnosis not present

## 2024-08-30 DIAGNOSIS — M9904 Segmental and somatic dysfunction of sacral region: Secondary | ICD-10-CM | POA: Diagnosis not present

## 2024-08-30 DIAGNOSIS — M9905 Segmental and somatic dysfunction of pelvic region: Secondary | ICD-10-CM | POA: Diagnosis not present

## 2024-10-05 ENCOUNTER — Other Ambulatory Visit: Payer: Self-pay | Admitting: Urology

## 2024-10-05 DIAGNOSIS — R3912 Poor urinary stream: Secondary | ICD-10-CM

## 2024-10-26 DIAGNOSIS — H9202 Otalgia, left ear: Secondary | ICD-10-CM | POA: Diagnosis not present

## 2024-10-26 DIAGNOSIS — J3489 Other specified disorders of nose and nasal sinuses: Secondary | ICD-10-CM | POA: Diagnosis not present

## 2024-11-01 DIAGNOSIS — N400 Enlarged prostate without lower urinary tract symptoms: Secondary | ICD-10-CM | POA: Diagnosis not present

## 2024-11-01 DIAGNOSIS — E785 Hyperlipidemia, unspecified: Secondary | ICD-10-CM | POA: Diagnosis not present

## 2024-11-01 DIAGNOSIS — R7303 Prediabetes: Secondary | ICD-10-CM | POA: Diagnosis not present

## 2024-11-01 DIAGNOSIS — N182 Chronic kidney disease, stage 2 (mild): Secondary | ICD-10-CM | POA: Diagnosis not present

## 2024-11-01 DIAGNOSIS — E559 Vitamin D deficiency, unspecified: Secondary | ICD-10-CM | POA: Diagnosis not present

## 2024-11-23 ENCOUNTER — Other Ambulatory Visit: Payer: Self-pay | Admitting: Physician Assistant

## 2024-11-23 DIAGNOSIS — I10 Essential (primary) hypertension: Secondary | ICD-10-CM

## 2024-11-24 MED ORDER — METOPROLOL SUCCINATE ER 25 MG PO TB24: 25.0000 mg | ORAL_TABLET | Freq: Every evening | ORAL | 2 refills | Status: AC

## 2024-11-24 MED ORDER — ISOSORBIDE MONONITRATE ER 30 MG PO TB24: 30.0000 mg | ORAL_TABLET | Freq: Every day | ORAL | 2 refills | Status: AC

## 2024-11-24 MED ORDER — ROSUVASTATIN CALCIUM 10 MG PO TABS: ORAL_TABLET | ORAL | 2 refills | Status: AC

## 2024-11-26 ENCOUNTER — Ambulatory Visit: Payer: Self-pay | Admitting: Nurse Practitioner

## 2024-11-26 ENCOUNTER — Ambulatory Visit (INDEPENDENT_AMBULATORY_CARE_PROVIDER_SITE_OTHER)

## 2024-11-26 ENCOUNTER — Ambulatory Visit
Admission: EM | Admit: 2024-11-26 | Discharge: 2024-11-26 | Disposition: A | Discharge status: Home / Self Care | Attending: Nurse Practitioner | Admitting: Nurse Practitioner

## 2024-11-26 DIAGNOSIS — R059 Cough, unspecified: Secondary | ICD-10-CM | POA: Diagnosis not present

## 2024-11-26 DIAGNOSIS — Z8709 Personal history of other diseases of the respiratory system: Secondary | ICD-10-CM | POA: Diagnosis not present

## 2024-11-26 MED ORDER — PROMETHAZINE-DM 6.25-15 MG/5ML PO SYRP: 5.0000 mL | ORAL_SOLUTION | Freq: Four times a day (QID) | ORAL | 0 refills | Status: AC | PRN

## 2024-11-26 MED ORDER — PREDNISONE 20 MG PO TABS: 40.0000 mg | ORAL_TABLET | Freq: Every day | ORAL | 0 refills | Status: AC

## 2024-11-26 NOTE — ED Provider Notes (Signed)
 " [facility]    CSN: [id number] Arrival date & time: 11/26/24  0841      History   Chief Complaint No chief complaint on file.   HPI [name] [name] is a 79 y.o. male.   The history is provided by the patient.   Patient presents for complaints of cough, fatigue, and nasal congestion that has been present for the past 3 weeks.  He states that he did have influenza when his symptoms initially started.  States he continues to have cough and generalized fatigue.  He denies fever, chills, headache, ear pain, chest pain, difficulty breathing, abdominal pain, nausea, vomiting, diarrhea, or rash.  He reports underlying history of asthma, states he has been using his inhaler and nasal spray for his symptoms.  States that he was not treated for his flulike symptoms by a physician, states he was in the emergency department and believes that he may have contracted influenza at that time.  Past Medical History:  Diagnosis Date   Arthritis    Asthma    uses inhaler daily and as needed   Blood transfusion 2012   surgery- esophageal bleeding secondary to hiatal hernia   CAD (coronary artery disease) 04/11/2024   CCTA 12/12/2022: CAC score 219 (49th percentile); RCA proximal-mid 70-99 (FFR 0.8), mid-distal 25-49; LM <25; LAD proximal and mid 50-69 (FFR 0.85), D1 25-49; LCx proximal 25-49, distal 70-99 (FFR 0.69) >> hemodynamically significant stenosis in distal LCx according to FFR-med Rx given distal vessel disease    CKD (chronic kidney disease)    Constipation    Coronary artery disease    Diverticulitis    Diverticulosis    Eczema    Esophageal stricture    GERD (gastroesophageal reflux disease)    HBP (high blood pressure)    on meds   Heart murmur    History of kidney stones    HTN (hypertension)    Hyperlipemia    on meds   Kidney stones    hx of   Pre-diabetes    Seasonal allergies    Trouble swallowing    Ulcer     Patient Active Problem List   Diagnosis Date  Noted   CAD (coronary artery disease) 04/11/2024   Asthma 07/01/2022   Benign prostatic hyperplasia without lower urinary tract symptoms 07/01/2022   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   Chronic kidney disease, 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   History of COVID-19 07/01/2022   History of urinary stone 07/01/2022   Hyperlipidemia 07/01/2022   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   Solitary pulmonary nodule 07/01/2022   Tobacco user 07/01/2022   Vitamin D deficiency 07/01/2022   Pain in lower limb 11/09/2021   Pain of left lower leg 11/09/2021   Gastroesophageal reflux disease 07/30/2019   Low back pain 07/03/2018   Pain in right knee 07/03/2018   Coughing/wheezing 06/15/2018   Perennial allergic rhinitis 02/24/2017   Mild persistent asthma without complication 02/24/2017   Subacute cough 02/24/2017   H/O: GI bleed 08/14/2012   Ulcer of esophagus with bleeding 08/14/2012   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 Authorizing Provider  acetaminophen  (TYLENOL ) 500 MG tablet Take 1,000 mg by mouth at bedtime as needed for moderate pain (pain score 4-6) or mild pain (pain score 1-3) (pain).    [provider]  ADVAIR [facility] 115-21 MCG/ACT inhaler Inhale 2 puffs into the lungs 2 (two) times daily. 01/14/24   Luke Orlan HERO, DO  albuterol  (VENTOLIN  HFA) 108  (90 Base) MCG/ACT inhaler Inhale 2 puffs into the lungs every 6 (six) hours as needed. 08/07/24   Leath-Warren, Etta PARAS, NP  amoxicillin -clavulanate (AUGMENTIN ) 875-125 MG tablet Take 1 tablet by mouth every 12 (twelve) hours. 08/07/24   Leath-Warren, Etta PARAS, NP  Azelastine  HCl 137 MCG/SPRAY SOLN USE 1 OR 2 SPRAYS INTO EACH NOSTRIL TWICE DAILY AS NEEDED FOR RUNNY NOSE/DRAINAGE. 07/01/22   Cheryl Reusing, FNP  diphenhydrAMINE  (BENADRYL ) 25 MG tablet Take 25 mg by mouth at bedtime as needed for allergies.    [provider]  fluticasone  (FLONASE ) 50 MCG/ACT nasal spray USE 2 SPRAYS INTO EACH NOSTRIL ONCE A DAY AS NEEDED FOR STUFFY NOSE 02/23/24   Cheryl Reusing, FNP  hydrOXYzine (ATARAX) 25 MG tablet Take 25 mg by mouth daily as needed for anxiety.    [provider]  isosorbide  mononitrate (IMDUR ) 30 MG 24 hr tablet Take 1 tablet (30 mg total) by mouth daily. 11/24/24   Jeffrie Oneil BROCKS, MD  levocetirizine (XYZAL ) 5 MG tablet TAKE 1 TABLET BY MOUTH ONCE DAILY AS NEEDED FOR RUNNY NOSE 03/04/23   Ambs, Arlean HERO, FNP  metoprolol  succinate (TOPROL -XL) 25 MG 24 hr tablet Take 1 tablet (25 mg total) by mouth every evening. 11/24/24   Jeffrie Oneil BROCKS, MD  montelukast  (SINGULAIR ) 10 MG tablet Take 1 tablet (10 mg total) by mouth at bedtime. 07/14/24   Luke Orlan HERO, DO  pantoprazole  (PROTONIX ) 40 MG tablet TAKE 1 TABLET BY MOUTH EVERY DAY 09/15/17   Abran Norleen SAILOR, MD  promethazine -dextromethorphan (PROMETHAZINE -DM) 6.25-15 MG/5ML syrup Take 5 mLs by mouth 4 (four) times daily as needed. 08/07/24   Leath-Warren, Etta PARAS, NP  rosuvastatin  (CRESTOR ) 10 MG tablet Take 1 tablet (10 mg) every Mon, Wed, Fri only. 11/24/24   Jeffrie Oneil BROCKS, MD  tamsulosin  (FLOMAX ) 0.4 MG CAPS capsule TAKE ONE CAPSULE BY MOUTH DAILY IN THE MORNING AND ONE CAPSULE AT BEDTIME 10/05/24   McKenzie, Belvie CROME, MD  triamcinolone cream (KENALOG) 0.1 % Apply 1 Application topically daily as needed (itching /dry skin). 02/23/20    [provider]  budesonide  (PULMICORT  FLEXHALER) 180 MCG/ACT inhaler Inhale 2 puffs into the lungs 2 (two) times daily. 09/27/19 09/27/19  Bobbitt, Elgin Pepper, MD    Family History Family History  Problem Relation Age of Onset   Heart disease Father 25       heart attack   Colon cancer Neg Hx    Allergic rhinitis Neg Hx    Angioedema Neg Hx    Asthma Neg Hx    Eczema Neg Hx    Immunodeficiency Neg Hx    Urticaria Neg Hx    Colon polyps Neg Hx    Esophageal cancer Neg Hx    Rectal cancer Neg Hx    Stomach cancer Neg Hx     Social  History Social History[1]   Allergies   Percocet [oxycodone -acetaminophen ] and Sulfacetamide sodium   Review of Systems Review of Systems Per HPI  Physical Exam Triage Vital Signs ED Triage Vitals  Encounter Vitals Group     BP 11/26/24 0851 126/71     Girls Systolic BP Percentile --      Girls Diastolic BP Percentile --      Boys Systolic BP Percentile --      Boys Diastolic BP Percentile --      Pulse Rate 11/26/24 0851 67     Resp 11/26/24 0851 20     Temp 11/26/24 0851 97.8 F (36.6 C)     Temp src --      SpO2 11/26/24 0851 95 %     Weight --      Height --      Head Circumference --      Peak Flow --      Pain Score 11/26/24 0855 0     Pain Loc --      Pain Education --      Exclude from Growth Chart --    No data found.  Updated Vital Signs BP 126/71 (BP Location: Right Arm)   Pulse 67   Temp 97.8 F (36.6 C)   Resp 20   SpO2 95%   Visual Acuity Right Eye Distance:   Left Eye Distance:   Bilateral Distance:    Right Eye Near:   Left Eye Near:    Bilateral Near:     Physical Exam Vitals and nursing note reviewed.  Constitutional:      General: He is not in acute distress.    Appearance: Normal appearance.  HENT:     Head: Normocephalic.     Right Ear: Tympanic membrane, ear canal and external ear normal.     Left Ear: Tympanic membrane, ear canal and external ear normal.     Nose: Congestion  present.     Mouth/Throat:     Mouth: Mucous membranes are moist.  Eyes:     Extraocular Movements: Extraocular movements intact.     Conjunctiva/sclera: Conjunctivae normal.     Pupils: Pupils are equal, round, and reactive to light.  Cardiovascular:     Rate and Rhythm: Normal rate and regular rhythm.     Pulses: Normal pulses.     Heart sounds: Normal heart sounds.  Pulmonary:     Effort: Pulmonary effort is normal. No respiratory distress.     Breath sounds: No stridor. Examination of the right-lower field reveals wheezing. Examination of the left-lower field reveals wheezing. Wheezing present. No rhonchi or rales.     Comments: Faint expiratory wheeze noted in the bilateral posterior lung fields  Chest:     Chest wall: No tenderness.  Abdominal:     General: Bowel sounds are normal.     Palpations: Abdomen is soft.  Musculoskeletal:     Cervical back: Normal range of motion.  Skin:    General: Skin is warm and dry.  Neurological:     General: No focal deficit present.     Mental Status: He is alert and oriented to person, place, and time.  Psychiatric:        Mood and Affect: Mood normal.        Behavior: Behavior normal.      UC Treatments / Results  Labs (all labs ordered are listed, but only abnormal results are displayed) Labs Reviewed - No data to display  EKG   Radiology No results found.  Procedures Procedures (including critical care time)  Medications Ordered in UC Medications - No data to display  Initial Impression / Assessment and Plan / UC Course  I have reviewed the triage vital signs and the nursing notes.  Pertinent labs & imaging results that were available during my care of the patient were reviewed by me and considered in my medical decision making (see chart for details).  Chest x-ray results are pending.  On exam, the patient does have faint expiratory wheezing noted in the posterior lower lobes.  Baseline O2 sats at 95%.  Patient with  underlying history of asthma.  Differential diagnoses include postviral cough versus acute bronchitis versus asthma exacerbation.  Will treat with prednisone 40 mg and Promethazine  DM for the cough.  If chest x-ray results show pneumonia, will treat accordingly.  Supportive care recommendations were provided and discussed with the patient to include fluids, rest, over-the-counter analgesics, use of a humidifier during sleep and to continue his current allergy medications.  Discussed indications with patient regarding follow-up.  Patient was in agreement with this plan of care and verbalizes understanding.  All questions were answered.  Patient stable for discharge.  Final Clinical Impressions(s) / UC Diagnoses   Final diagnoses:  Cough, unspecified type   Discharge Instructions   None    ED Prescriptions   None    PDMP not reviewed this encounter.    [1]  Social History Tobacco Use   Smoking status: Former    Current packs/day: 0.00    Types: Cigarettes    Quit date: 11/25/1981    Years since quitting: 43.0    Passive exposure: Never   Smokeless tobacco: Never  Vaping Use   Vaping status: Never Used  Substance Use Topics   Alcohol use: Not Currently    Alcohol/week: 0.0 - 3.0 standard drinks of alcohol   Drug use: No     Gilmer Etta PARAS, NP 11/26/24 1003  "

## 2024-11-26 NOTE — Discharge Instructions (Addendum)
 Your chest x-ray results are pending.  You will be contacted if the chest x-ray results are abnormal.  You will also have access to the results via MyChart. Take medication as prescribed. Increase fluids and allow for plenty of rest. You may take over-the-counter Tylenol  as needed for pain, fever, or general discomfort. Recommend the use of a humidifier in your bedroom at nighttime during sleep and sleeping elevated on pillows while symptoms persist. Continue use of your asthma medications as directed. Please be advised that your cough may last from days to weeks.  If you begin to feel well, but continued to have a persistent nagging cough, continue over-the-counter cough and cold medications, fluids, and use of cough drops.  Seek care if you develop new symptoms such as fever, chills, persistent wheezing, shortness of breath, or difficulty breathing. Follow-up as needed.

## 2024-11-26 NOTE — ED Triage Notes (Signed)
 Pt reports cough, congestion, fatigue x 2 weeks after having the flu  x 3 weeks ago.

## 2024-12-31 ENCOUNTER — Other Ambulatory Visit: Payer: Self-pay | Admitting: Urology

## 2024-12-31 DIAGNOSIS — R3912 Poor urinary stream: Secondary | ICD-10-CM

## 2025-01-13 ENCOUNTER — Other Ambulatory Visit: Payer: PPO

## 2025-01-17 ENCOUNTER — Ambulatory Visit: Admitting: Allergy

## 2025-01-21 ENCOUNTER — Ambulatory Visit: Payer: PPO | Admitting: Urology

## 2025-07-13 ENCOUNTER — Ambulatory Visit: Admitting: Urology
